# Patient Record
Sex: Female | Born: 1935 | Race: White | Hispanic: No | Smoking: Current every day smoker
Health system: Southern US, Community
[De-identification: ages and names within clinical notes are randomized; demographics above are authoritative.]

## PROBLEM LIST (undated history)

## (undated) DIAGNOSIS — I10 Essential (primary) hypertension: Secondary | ICD-10-CM

## (undated) DIAGNOSIS — I071 Rheumatic tricuspid insufficiency: Secondary | ICD-10-CM

## (undated) DIAGNOSIS — E7800 Pure hypercholesterolemia, unspecified: Secondary | ICD-10-CM

## (undated) DIAGNOSIS — J449 Chronic obstructive pulmonary disease, unspecified: Secondary | ICD-10-CM

## (undated) DIAGNOSIS — I4891 Unspecified atrial fibrillation: Secondary | ICD-10-CM

## (undated) DIAGNOSIS — I5032 Chronic diastolic (congestive) heart failure: Secondary | ICD-10-CM

## (undated) DIAGNOSIS — I639 Cerebral infarction, unspecified: Secondary | ICD-10-CM

## (undated) DIAGNOSIS — I2510 Atherosclerotic heart disease of native coronary artery without angina pectoris: Secondary | ICD-10-CM

## (undated) HISTORY — PX: CORONARY ANGIOPLASTY WITH STENT PLACEMENT: SHX49

## (undated) HISTORY — PX: HIP SURGERY: SHX245

---

## 2009-06-08 ENCOUNTER — Encounter: Admission: RE | Admit: 2009-06-08 | Discharge: 2009-06-08 | Payer: Self-pay | Admitting: Cardiovascular Disease

## 2009-06-14 ENCOUNTER — Inpatient Hospital Stay (HOSPITAL_COMMUNITY): Admission: RE | Admit: 2009-06-14 | Discharge: 2009-06-15 | Payer: Self-pay | Admitting: Cardiovascular Disease

## 2009-06-25 ENCOUNTER — Ambulatory Visit: Payer: Self-pay | Admitting: Surgery

## 2009-07-03 ENCOUNTER — Encounter: Payer: Self-pay | Admitting: Surgery

## 2009-07-03 ENCOUNTER — Inpatient Hospital Stay (HOSPITAL_COMMUNITY): Admission: RE | Admit: 2009-07-03 | Discharge: 2009-07-04 | Payer: Self-pay | Admitting: Surgery

## 2009-07-03 ENCOUNTER — Ambulatory Visit: Payer: Self-pay | Admitting: Surgery

## 2009-07-16 ENCOUNTER — Ambulatory Visit: Payer: Self-pay | Admitting: Surgery

## 2009-08-28 ENCOUNTER — Inpatient Hospital Stay (HOSPITAL_COMMUNITY): Admission: RE | Admit: 2009-08-28 | Discharge: 2009-08-29 | Payer: Self-pay | Admitting: Cardiovascular Disease

## 2010-02-12 ENCOUNTER — Encounter: Admission: RE | Admit: 2010-02-12 | Discharge: 2010-02-12 | Payer: Self-pay | Admitting: Vascular Surgery

## 2010-12-02 LAB — BASIC METABOLIC PANEL
BUN: 18 mg/dL (ref 6–23)
CO2: 26 mEq/L (ref 19–32)
Calcium: 8.9 mg/dL (ref 8.4–10.5)
Calcium: 9 mg/dL (ref 8.4–10.5)
Chloride: 105 mEq/L (ref 96–112)
Chloride: 111 mEq/L (ref 96–112)
Creatinine, Ser: 1.19 mg/dL (ref 0.4–1.2)
GFR calc Af Amer: 53 mL/min — ABNORMAL LOW (ref >60)
Glucose, Bld: 94 mg/dL (ref 70–99)
Sodium: 136 mEq/L (ref 135–145)
Sodium: 143 mEq/L (ref 135–145)

## 2010-12-02 LAB — CBC
Hemoglobin: 11.2 g/dL — ABNORMAL LOW (ref 12.0–15.0)
MCHC: 34.3 g/dL (ref 30.0–36.0)
MCV: 96.2 fL (ref 78.0–100.0)
Platelets: 138 10*3/uL — ABNORMAL LOW (ref 150–400)
RDW: 13 % (ref 11.5–15.5)
WBC: 6.6 10*3/uL (ref 4.0–10.5)

## 2010-12-04 LAB — BASIC METABOLIC PANEL
BUN: 18 mg/dL (ref 6–23)
CO2: 24 mEq/L (ref 19–32)
Chloride: 105 mEq/L (ref 96–112)
Glucose, Bld: 96 mg/dL (ref 70–99)
Potassium: 3.9 mEq/L (ref 3.5–5.1)
Sodium: 134 mEq/L — ABNORMAL LOW (ref 135–145)

## 2010-12-04 LAB — CBC: Hemoglobin: 9.9 g/dL — ABNORMAL LOW (ref 12.0–15.0)

## 2010-12-05 LAB — CBC
Hemoglobin: 11.6 g/dL — ABNORMAL LOW (ref 12.0–15.0)
MCV: 98.5 fL (ref 78.0–100.0)
Platelets: 189 10*3/uL (ref 150–400)
Platelets: 152 10*3/uL (ref 150–400)
RBC: 4.06 MIL/uL (ref 3.87–5.11)
RDW: 13.6 % (ref 11.5–15.5)

## 2010-12-05 LAB — COMPREHENSIVE METABOLIC PANEL
ALT: 15 U/L (ref 0–35)
Alkaline Phosphatase: 50 U/L (ref 39–117)
BUN: 24 mg/dL — ABNORMAL HIGH (ref 6–23)
Creatinine, Ser: 1.38 mg/dL — ABNORMAL HIGH (ref 0.4–1.2)
Total Bilirubin: 0.5 mg/dL (ref 0.3–1.2)
Total Protein: 7.1 g/dL (ref 6.0–8.3)

## 2010-12-05 LAB — BASIC METABOLIC PANEL
BUN: 15 mg/dL (ref 6–23)
BUN: 19 mg/dL (ref 6–23)
CO2: 24 mEq/L (ref 19–32)
CO2: 31 mEq/L (ref 19–32)
Calcium: 8.7 mg/dL (ref 8.4–10.5)
Chloride: 103 mEq/L (ref 96–112)
Creatinine, Ser: 1.6 mg/dL — ABNORMAL HIGH (ref 0.4–1.2)
GFR calc Af Amer: 38 mL/min — ABNORMAL LOW (ref >60)
GFR calc Af Amer: 51 mL/min — ABNORMAL LOW (ref >60)
Glucose, Bld: 81 mg/dL (ref 70–99)
Potassium: 3.4 mEq/L — ABNORMAL LOW (ref 3.5–5.1)
Potassium: 4.1 mEq/L (ref 3.5–5.1)
Sodium: 134 mEq/L — ABNORMAL LOW (ref 135–145)
Sodium: 142 mEq/L (ref 135–145)

## 2010-12-05 LAB — PROTIME-INR
INR: 1 (ref 0.00–1.49)
Prothrombin Time: 13.1 seconds (ref 11.6–15.2)

## 2010-12-05 LAB — URINALYSIS, ROUTINE W REFLEX MICROSCOPIC
Urobilinogen, UA: 0.2 mg/dL (ref 0.0–1.0)
pH: 8 (ref 5.0–8.0)

## 2010-12-05 LAB — TYPE AND SCREEN: ABO/RH(D): A POS

## 2010-12-05 LAB — APTT: aPTT: 31 seconds (ref 24–37)

## 2010-12-05 LAB — URINE MICROSCOPIC-ADD ON

## 2011-01-14 NOTE — Assessment & Plan Note (Signed)
OFFICE VISIT   Sherry, Sherry [name]  DOB:  12/13/1928                                       06/25/2009  CHART#:[id number]   REASON FOR VISIT:  Right carotid stenosis.   HISTORY:  This is a 77 year old female seen at the request of Dr. Allyson [name]  for evaluation of a high-grade right carotid stenosis which is  asymptomatic.  The patient has no complaints at this time.  She denies  numbness or weakness in either extremity.  She denies slurring of her  speech.  She denies amaurosis fugax.   The patient has a history of hypertension and hypercholesterolemia, both  of which are managed medically.  She has undergone cardiac  catheterization in [place] and has a questionable stent.  She recently  underwent cardiac catheterization by Dr. Allyson [name], who found stable  coronary artery disease with 100% right coronary stenosis with left-to-  right collaterals.  The patient also has peripheral vascular disease and  left leg claudication with a highly-stenotic left external iliac artery.  She continues to be a smoker and is trying to quit.  She is now down to  4-6 cigarettes per day.   REVIEW OF SYSTEMS:  CARDIAC:  Positive for chest pain in chest pressure.  GI:  Positive for constipation.  GU:  Positive for frequent urination.  NEUROLOGIC:  Positive for dizziness.  MUSCULOSKELETAL:  Positive for arthritis.  PSYCH:  Positive for depression.  ENT:  Positive for change in eyesight.  All other review of systems is negative as detailed in the encounter  form.   PAST MEDICAL HISTORY:  Hypertension, hypercholesterolemia, coronary  artery disease, peripheral vascular disease, tobacco abuse.   FAMILY HISTORY:  History of cerebrovascular disease and stroke run in  her family.   SOCIAL HISTORY:  She is single with 6 children.  She is retired.  She  currently smokes 4-6 cigarettes a day.  She does not drink alcohol.   MEDICATIONS:  Please see medical record.   ALLERGIES:  None.   PHYSICAL EXAMINATION:  Heart rate 60, blood pressure is 171/80, O2  saturations are 98%.  General:  She is well-appearing, in no distress.  HEENT:  Normocephalic, atraumatic.  Pupils equal.  Sclerae are  anicteric.  Extraocular muscles are intact.  Neck is supple.  No JVD.  No carotid bruits.  Cardiovascular is regular rate rhythm, no murmurs.  Lungs are clear bilaterally.  Abdomen is soft.  Musculoskeletal:  No  major deformities or cyanosis.  Neurologic:  There is no focal weakness  or paresthesias.  Skin is without rash.  Psych:  She is alert and  oriented x3.   DIAGNOSTIC STUDIES:  I have independently reviewed the carotid angiogram  performed by Dr. Allyson [name].  This reveals a high-grade right carotid  stenosis approximately 95% at the bifurcation.  It is 2 cm below the  angle of the mandible.   ASSESSMENT/PLAN:  Asymptomatic high-grade right carotid stenosis.   PLAN:  The patient will be scheduled for a right carotid endarterectomy.  I have discussed the risks and benefits of proceeding with surgery.  This includes the risk of stroke, the risk of nerve injury, the risk of  bleeding and cardiopulmonary complications.  All of the patient's  questions were answered.  She was here today with her daughter.  Her  surgery has been scheduled for Tuesday,  November 2.   Jorge Ny, MD  Electronically Signed   VWB/MEDQ  D:  06/25/2009  T:  06/26/2009  Job:  [id number]   cc:   Sherry [name], M.D.

## 2011-01-14 NOTE — Assessment & Plan Note (Signed)
OFFICE VISIT   LYSHA, SCHRADE L  DOB:  [birth year]                                       07/16/2009  CHART#:[id number]   REASON FOR VISIT:  Postoperative.   HISTORY:  This is a 77 year old female who is status post right carotid  endarterectomy on July 03, 2009.  This was done for asymptomatic  disease.  Operative findings included 75% stenosis without thrombus.  The patient's postoperative course was uncomplicated.  She discharged  home the following day.  She comes back in today for followup.  She is  having no complaints at this time.  She is neurologically intact.  Her  incision is well healed.   The patient will continue to receive her carotid followup at Dr. Hazle Coca  office.  She will contact me on p.r.n. basis.   Jorge Ny, MD  Electronically Signed   VWB/MEDQ  D:  07/16/2009  T:  07/17/2009  Job:  [id number]   cc:   Nanetta Batty, M.D.

## 2011-01-14 NOTE — Procedures (Signed)
[name], [name] NO.:  [id number]   MEDICAL RECORD NO.:  [id number]          PATIENT TYPE:  INP   LOCATION:  2807                         FACILITY:  [facility]   PHYSICIAN:  Nanetta Batty, M.D.   DATE OF BIRTH:  1929/06/10   DATE OF PROCEDURE:  DATE OF DISCHARGE:                    PERIPHERAL VASCULAR INVASIVE PROCEDURE   [name] is a 77 year old, widowed, white female, mother of 7,  grandmother of 1, who recently relocated from [place] approximately 1  year ago.  She was sent to me for cardiovascular evaluation with  Dopplers that were performed back in February that showed high-grade  right ICA stenosis.  She also has left hip claudication and ABI of 0.7  on that side.  She presents now after having undergone diagnostic  coronary arteriography revealing 1-vessel CAD for cerebral angiography  and distal abdominal aortography.   PROCEDURE DESCRIPTION:  Using the 5-French sheath which was previously  inserted in the right femoral artery, arch angiography, distal abdominal  aortography, selective right and left carotid and left vertebral  angiography, as well as selective left iliac angiography were performed.  Visipaque dye was used for the entirety of the case.  Retrograde aortic  pressure was monitored during the case.   ANGIOGRAPHIC RESULTS:  Arch aortogram:  Type 2 arch.  Right carotid:  A 95% right carotid bulb/proximal ICA with complex  disease.  The right carotid did not fill the anterior cerebrals.  Left carotid:  No significant disease in the ICAs.  The left carotid did  fill both anterior cerebrals.  There appeared to be 60% stenosis in the  intracranial carotid in what appears to be the cavernous portion.  Neuro  Interventional Radiology will interpret this.  Left vertebral:  Large vessel __________significant disease.   ABDOMINAL AORTOGRAPHY:  Renal arteries:  Normal.  Infrarenal abdominal aorta:  Moderate atherosclerotic changes.  Left lower  extremity:  A 95% left external iliac artery stenosis.   IMPRESSION:  Sherry [name] has high-grade right internal carotid artery  stenosis which will require endarterectomy for prophylaxis against  stroke.  She will also need staged left external iliac artery PT and  stent for functional lumen and claudication.   Sheath was removed and pressure was applied to achieve hemostasis.  Patient left the lab in stable condition.  She will be hydrated  overnight, discharge home in the morning, and will see me back in 1 week  for followup.      Nanetta Batty, M.D.  Electronically Signed     JB/MEDQ  D:  06/14/2009  T:  06/14/2009  Job:  [id number]   cc:   [facility]  Fleet Contras, M.D.  [facility] Angiographic Suite Second Floor, [place]

## 2011-01-14 NOTE — Procedures (Signed)
NAMEJAYLIAH, BENETT NO.:  [id number]   MEDICAL RECORD NO.:  [id number]          PATIENT TYPE:  INP   LOCATION:  2502                         FACILITY:  [facility]   PHYSICIAN:  Nanetta Batty, M.D.   DATE OF BIRTH:  May 17, 1929   DATE OF PROCEDURE:  08/28/2009  DATE OF DISCHARGE:                    PERIPHERAL VASCULAR INVASIVE PROCEDURE   Ms. Rocque is a 77 year old thin-appearing widowed white female mother of  24, grandmother to 78 grandchildren who has a history of CAD status post  cath revealing total RCA with left-right collaterals and normal LV  function.  She has hypertension, hyperlipidemia, tobacco abuse as well  as family history.   Angiogram to her revealing a 95% right internal carotid artery stenosis  and high grade left external iliac artery stenosis.  She underwent a  left-to-right carotid endarterectomy by Dr. Durene Cal on November  2nd and was discharged home the following day.  She recuperated nicely.  I saw her back in the office on December 7th and arranged for her to be  admitted today for TPA and stenting of her left external iliac artery  for functionally limiting claudication.   PROCEDURE DESCRIPTION:  The patient was brought to the 2nd floor [facility] Angiographic suite in the post absorptive state.  She was  premedicated with p.o. valium, some IV Versed and fentanyl.  Her right  groin was prepped and shaved in the usual sterile fashion.  Xylocaine 1%  was used for local anesthesia.  A 6-French crossover sheath was inserted  into the right femoral artery using standard Seldinger technique.  A 5-  [place] crossover catheter, a 3.5 Wholey wire was used to obtain  contralateral access.  The patient received __________ units of heparin  intravenously.  Visapaque dye was used for the entirety of the case.   The [facility] wire crossed the lesion and the lesion was predilated with a  4 x 2 power flex.  Stenting was performed using 8 x 4  Absolute  __________ self-expanding stent and pressed dilatation with a 7 x 2  power flex at 2 atmospheres resulting in reduction of 90% filling focal,  left external iliac carotid stenosis to zero percent residual.  The  patient tolerated the procedure well.  The sheath was withdrawn across  the bifurcation and exchanged for a short 6-French sheath.  The patient  left the lab in stable condition.  The sheath will be removed once  __________ 200.  The patient will be gently hydrated, and will be  discharged home in the morning.  Get followup Dopplers and ABIs after  which she will see me back in the office in followup.   She left the lab in stable condition.      Nanetta Batty, M.D.  Electronically Signed     JB/MEDQ  D:  08/28/2009  T:  08/28/2009  Job:  [id number]   cc:   2nd Floor PV Angiographic Suite  [facility]  Fleet Contras, M.D.

## 2017-09-03 DIAGNOSIS — M545 Low back pain: Secondary | ICD-10-CM | POA: Diagnosis not present

## 2017-09-03 DIAGNOSIS — I10 Essential (primary) hypertension: Secondary | ICD-10-CM | POA: Diagnosis not present

## 2017-09-03 DIAGNOSIS — I2510 Atherosclerotic heart disease of native coronary artery without angina pectoris: Secondary | ICD-10-CM | POA: Diagnosis not present

## 2017-09-03 DIAGNOSIS — Z720 Tobacco use: Secondary | ICD-10-CM | POA: Diagnosis not present

## 2017-09-03 DIAGNOSIS — Z79899 Other long term (current) drug therapy: Secondary | ICD-10-CM | POA: Diagnosis not present

## 2017-09-03 DIAGNOSIS — I739 Peripheral vascular disease, unspecified: Secondary | ICD-10-CM | POA: Diagnosis not present

## 2017-09-03 DIAGNOSIS — E7800 Pure hypercholesterolemia, unspecified: Secondary | ICD-10-CM | POA: Diagnosis not present

## 2017-09-08 ENCOUNTER — Ambulatory Visit
Admission: RE | Admit: 2017-09-08 | Discharge: 2017-09-08 | Disposition: A | Discharge status: Home / Self Care | Payer: Medicare HMO | Source: Ambulatory Visit | Attending: Family Medicine | Admitting: Family Medicine

## 2017-09-08 ENCOUNTER — Other Ambulatory Visit: Payer: Self-pay | Admitting: Family Medicine

## 2017-09-08 DIAGNOSIS — M545 Low back pain: Secondary | ICD-10-CM

## 2017-09-08 DIAGNOSIS — M47817 Spondylosis without myelopathy or radiculopathy, lumbosacral region: Secondary | ICD-10-CM | POA: Diagnosis not present

## 2017-10-11 ENCOUNTER — Inpatient Hospital Stay (HOSPITAL_COMMUNITY)
Admission: EM | Admit: 2017-10-11 | Discharge: 2017-10-13 | DRG: 690 | Disposition: A | Discharge status: Home Health | Payer: Medicare HMO | Attending: Internal Medicine | Admitting: Internal Medicine

## 2017-10-11 ENCOUNTER — Encounter (HOSPITAL_COMMUNITY): Payer: Self-pay

## 2017-10-11 ENCOUNTER — Other Ambulatory Visit: Payer: Self-pay

## 2017-10-11 ENCOUNTER — Emergency Department (HOSPITAL_COMMUNITY): Payer: Medicare HMO

## 2017-10-11 DIAGNOSIS — R32 Unspecified urinary incontinence: Secondary | ICD-10-CM | POA: Diagnosis present

## 2017-10-11 DIAGNOSIS — E876 Hypokalemia: Secondary | ICD-10-CM | POA: Diagnosis present

## 2017-10-11 DIAGNOSIS — I248 Other forms of acute ischemic heart disease: Secondary | ICD-10-CM | POA: Diagnosis not present

## 2017-10-11 DIAGNOSIS — Z7902 Long term (current) use of antithrombotics/antiplatelets: Secondary | ICD-10-CM | POA: Diagnosis not present

## 2017-10-11 DIAGNOSIS — R269 Unspecified abnormalities of gait and mobility: Secondary | ICD-10-CM | POA: Diagnosis not present

## 2017-10-11 DIAGNOSIS — I700 Atherosclerosis of aorta: Secondary | ICD-10-CM | POA: Diagnosis not present

## 2017-10-11 DIAGNOSIS — I2510 Atherosclerotic heart disease of native coronary artery without angina pectoris: Secondary | ICD-10-CM | POA: Diagnosis present

## 2017-10-11 DIAGNOSIS — E785 Hyperlipidemia, unspecified: Secondary | ICD-10-CM | POA: Diagnosis present

## 2017-10-11 DIAGNOSIS — B9729 Other coronavirus as the cause of diseases classified elsewhere: Secondary | ICD-10-CM | POA: Diagnosis present

## 2017-10-11 DIAGNOSIS — I272 Pulmonary hypertension, unspecified: Secondary | ICD-10-CM | POA: Diagnosis present

## 2017-10-11 DIAGNOSIS — R300 Dysuria: Secondary | ICD-10-CM

## 2017-10-11 DIAGNOSIS — I351 Nonrheumatic aortic (valve) insufficiency: Secondary | ICD-10-CM | POA: Diagnosis not present

## 2017-10-11 DIAGNOSIS — Z955 Presence of coronary angioplasty implant and graft: Secondary | ICD-10-CM | POA: Diagnosis not present

## 2017-10-11 DIAGNOSIS — R509 Fever, unspecified: Secondary | ICD-10-CM | POA: Diagnosis not present

## 2017-10-11 DIAGNOSIS — Z8249 Family history of ischemic heart disease and other diseases of the circulatory system: Secondary | ICD-10-CM | POA: Diagnosis not present

## 2017-10-11 DIAGNOSIS — I10 Essential (primary) hypertension: Secondary | ICD-10-CM | POA: Diagnosis present

## 2017-10-11 DIAGNOSIS — Z7982 Long term (current) use of aspirin: Secondary | ICD-10-CM

## 2017-10-11 DIAGNOSIS — Z79899 Other long term (current) drug therapy: Secondary | ICD-10-CM

## 2017-10-11 DIAGNOSIS — F17210 Nicotine dependence, cigarettes, uncomplicated: Secondary | ICD-10-CM | POA: Diagnosis present

## 2017-10-11 DIAGNOSIS — J069 Acute upper respiratory infection, unspecified: Secondary | ICD-10-CM | POA: Diagnosis not present

## 2017-10-11 DIAGNOSIS — G8929 Other chronic pain: Secondary | ICD-10-CM | POA: Diagnosis present

## 2017-10-11 DIAGNOSIS — R0602 Shortness of breath: Secondary | ICD-10-CM | POA: Diagnosis not present

## 2017-10-11 DIAGNOSIS — R05 Cough: Secondary | ICD-10-CM | POA: Diagnosis not present

## 2017-10-11 DIAGNOSIS — B9620 Unspecified Escherichia coli [E. coli] as the cause of diseases classified elsewhere: Secondary | ICD-10-CM | POA: Diagnosis present

## 2017-10-11 DIAGNOSIS — I4891 Unspecified atrial fibrillation: Secondary | ICD-10-CM | POA: Diagnosis not present

## 2017-10-11 DIAGNOSIS — J449 Chronic obstructive pulmonary disease, unspecified: Secondary | ICD-10-CM | POA: Diagnosis not present

## 2017-10-11 DIAGNOSIS — R000 Tachycardia, unspecified: Secondary | ICD-10-CM | POA: Diagnosis not present

## 2017-10-11 DIAGNOSIS — N390 Urinary tract infection, site not specified: Principal | ICD-10-CM | POA: Diagnosis present

## 2017-10-11 HISTORY — DX: Chronic obstructive pulmonary disease, unspecified: J44.9

## 2017-10-11 HISTORY — DX: Essential (primary) hypertension: I10

## 2017-10-11 HISTORY — DX: Pure hypercholesterolemia, unspecified: E78.00

## 2017-10-11 LAB — URINALYSIS, ROUTINE W REFLEX MICROSCOPIC
Bilirubin Urine: NEGATIVE
Glucose, UA: NEGATIVE mg/dL
KETONES UR: NEGATIVE mg/dL
Leukocytes, UA: NEGATIVE
Nitrite: POSITIVE — AB
PROTEIN: 30 mg/dL — AB
Specific Gravity, Urine: 1.008 (ref 1.005–1.030)
Squamous Epithelial / LPF: NONE SEEN
pH: 9 — ABNORMAL HIGH (ref 5.0–8.0)

## 2017-10-11 LAB — COMPREHENSIVE METABOLIC PANEL
ALT: 14 U/L (ref 14–54)
ANION GAP: 13 (ref 5–15)
AST: 21 U/L (ref 15–41)
Albumin: 3.7 g/dL (ref 3.5–5.0)
Alkaline Phosphatase: 59 U/L (ref 38–126)
BUN: 12 mg/dL (ref 6–20)
CHLORIDE: 101 mmol/L (ref 101–111)
CO2: 23 mmol/L (ref 22–32)
Calcium: 9.2 mg/dL (ref 8.9–10.3)
Creatinine, Ser: 0.84 mg/dL (ref 0.44–1.00)
Glucose, Bld: 131 mg/dL — ABNORMAL HIGH (ref 65–99)
POTASSIUM: 3.4 mmol/L — AB (ref 3.5–5.1)
SODIUM: 137 mmol/L (ref 135–145)
Total Bilirubin: 0.7 mg/dL (ref 0.3–1.2)
Total Protein: 7.2 g/dL (ref 6.5–8.1)

## 2017-10-11 LAB — CBC WITH DIFFERENTIAL/PLATELET
Basophils Absolute: 0 10*3/uL (ref 0.0–0.1)
Basophils Relative: 0 %
Eosinophils Absolute: 0.1 10*3/uL (ref 0.0–0.7)
Eosinophils Relative: 1 %
HEMATOCRIT: 40.4 % (ref 36.0–46.0)
HEMOGLOBIN: 13.1 g/dL (ref 12.0–15.0)
LYMPHS ABS: 0.8 10*3/uL (ref 0.7–4.0)
Lymphocytes Relative: 9 %
MCH: 32.1 pg (ref 26.0–34.0)
MCHC: 32.4 g/dL (ref 30.0–36.0)
MCV: 99 fL (ref 78.0–100.0)
MONOS PCT: 4 %
Monocytes Absolute: 0.4 10*3/uL (ref 0.1–1.0)
NEUTROS ABS: 7.4 10*3/uL (ref 1.7–7.7)
NEUTROS PCT: 86 %
Platelets: 186 10*3/uL (ref 150–400)
RBC: 4.08 MIL/uL (ref 3.87–5.11)
RDW: 15.3 % (ref 11.5–15.5)
WBC: 8.6 10*3/uL (ref 4.0–10.5)

## 2017-10-11 LAB — INFLUENZA PANEL BY PCR (TYPE A & B)
INFLBPCR: NEGATIVE
Influenza A By PCR: NEGATIVE

## 2017-10-11 LAB — I-STAT CG4 LACTIC ACID, ED
LACTIC ACID, VENOUS: 1.95 mmol/L — AB (ref 0.5–1.9)
LACTIC ACID, VENOUS: 0.32 mmol/L — AB (ref 0.5–1.9)

## 2017-10-11 MED ORDER — VITAMIN D 1000 UNITS PO TABS
1000.0000 [IU] | ORAL_TABLET | Freq: Every day | ORAL | Status: DC
  Administered 2017-10-12 – 2017-10-13 (×2): 1000 [IU] via ORAL
  Filled 2017-10-11 (×2): qty 1

## 2017-10-11 MED ORDER — OMEGA-3-ACID ETHYL ESTERS 1 G PO CAPS
1.0000 g | ORAL_CAPSULE | Freq: Every day | ORAL | Status: DC
  Administered 2017-10-12 – 2017-10-13 (×2): 1 g via ORAL
  Filled 2017-10-11 (×2): qty 1

## 2017-10-11 MED ORDER — SODIUM CHLORIDE 0.9 % IV BOLUS (SEPSIS)
1000.0000 mL | Freq: Once | INTRAVENOUS | Status: AC
  Administered 2017-10-11: 1000 mL via INTRAVENOUS

## 2017-10-11 MED ORDER — ACETAMINOPHEN 325 MG PO TABS
650.0000 mg | ORAL_TABLET | Freq: Four times a day (QID) | ORAL | Status: DC | PRN
  Administered 2017-10-12 – 2017-10-13 (×4): 650 mg via ORAL
  Filled 2017-10-11 (×4): qty 2

## 2017-10-11 MED ORDER — ENOXAPARIN SODIUM 60 MG/0.6ML ~~LOC~~ SOLN
1.0000 mg/kg | Freq: Once | SUBCUTANEOUS | Status: AC
  Administered 2017-10-12: 55 mg via SUBCUTANEOUS
  Filled 2017-10-11 (×2): qty 0.6

## 2017-10-11 MED ORDER — ACETAMINOPHEN 500 MG PO TABS
1000.0000 mg | ORAL_TABLET | Freq: Once | ORAL | Status: AC
Start: 2017-10-11 — End: 2017-10-11
  Administered 2017-10-11: 1000 mg via ORAL
  Filled 2017-10-11: qty 2

## 2017-10-11 MED ORDER — DEXTROSE 5 % IV SOLN
1.0000 g | Freq: Once | INTRAVENOUS | Status: AC
  Administered 2017-10-11: 1 g via INTRAVENOUS
  Filled 2017-10-11: qty 10

## 2017-10-11 MED ORDER — DOCUSATE SODIUM 100 MG PO CAPS
100.0000 mg | ORAL_CAPSULE | Freq: Every day | ORAL | Status: DC
  Administered 2017-10-12 – 2017-10-13 (×2): 100 mg via ORAL
  Filled 2017-10-11 (×2): qty 1

## 2017-10-11 MED ORDER — CLOPIDOGREL BISULFATE 75 MG PO TABS
75.0000 mg | ORAL_TABLET | Freq: Every day | ORAL | Status: DC
  Administered 2017-10-12 – 2017-10-13 (×2): 75 mg via ORAL
  Filled 2017-10-11 (×2): qty 1

## 2017-10-11 MED ORDER — IRBESARTAN 75 MG PO TABS
75.0000 mg | ORAL_TABLET | Freq: Every day | ORAL | Status: DC
  Administered 2017-10-12 – 2017-10-13 (×2): 75 mg via ORAL
  Filled 2017-10-11 (×2): qty 1

## 2017-10-11 MED ORDER — ATORVASTATIN CALCIUM 40 MG PO TABS
40.0000 mg | ORAL_TABLET | Freq: Every day | ORAL | Status: DC
  Administered 2017-10-12 (×2): 40 mg via ORAL
  Filled 2017-10-11 (×2): qty 1

## 2017-10-11 MED ORDER — ASPIRIN 81 MG PO CHEW
81.0000 mg | CHEWABLE_TABLET | Freq: Every day | ORAL | Status: DC
  Administered 2017-10-12 – 2017-10-13 (×2): 81 mg via ORAL
  Filled 2017-10-11 (×2): qty 1

## 2017-10-11 MED ORDER — DILTIAZEM HCL-DEXTROSE 100-5 MG/100ML-% IV SOLN (PREMIX)
5.0000 mg/h | INTRAVENOUS | Status: DC
  Administered 2017-10-11: 5 mg/h via INTRAVENOUS
  Filled 2017-10-11: qty 100

## 2017-10-11 MED ORDER — ACETAMINOPHEN 650 MG RE SUPP: 650.0000 mg | Freq: Four times a day (QID) | RECTAL | Status: DC | PRN

## 2017-10-11 MED ORDER — DILTIAZEM LOAD VIA INFUSION
10.0000 mg | Freq: Once | INTRAVENOUS | Status: AC
  Administered 2017-10-11: 10 mg via INTRAVENOUS
  Filled 2017-10-11: qty 10

## 2017-10-11 MED ORDER — SODIUM CHLORIDE 0.9 % IV SOLN
INTRAVENOUS | Status: DC
  Administered 2017-10-12: via INTRAVENOUS

## 2017-10-11 MED ORDER — ENOXAPARIN SODIUM 40 MG/0.4ML ~~LOC~~ SOLN: 40.0000 mg | SUBCUTANEOUS | Status: DC

## 2017-10-11 NOTE — ED Notes (Signed)
Patient transported to X-ray 

## 2017-10-11 NOTE — H&P (Signed)
TRH H&P   Patient Demographics:    Sherry [name], is a 84 y.o. female  MRN: [id number]   DOB - Nov 05, 1928  Admit Date - 10/11/2017  Outpatient Primary MD for the patient is Darrow Bussing, MD  Referring MD/NP/PA: Iantha Fallen PA  Outpatient Specialists:   Patient coming from: home  Chief Complaint  Patient presents with  . Cough  . Fatigue      HPI:    Sherry [name]  is a 84 y.o. female, w Copd (not on home o2), , Tobacco use, Hypertension, Hyperlipidemia,  CAD s/p stent apparently presents with c/o tachycardia and bp elevation, and urinary incontinence.   In Ed,  EKG Afib at 130, nl axis  CXR IMPRESSION: 1. No convincing pneumonia. 2. Prominent bronchovascular markings, but no convincing pulmonary edema. Hyperexpanded lungs. Findings suggest COPD.   Na 137, K 3.4, Bun 12, Creatinine 0.84 Ast 21, Alt 14  Wbc 8.6, Hgb 13.1, Plt 186  Urine prot 30, nitrite positive, LE negative Wbc 0-5  LA 1.95  Pt will be admitted for fever unclear source, afib with RVR,     Review of systems:    In addition to the HPI above, No Fever-chills, No Headache, No changes with Vision or hearing, No problems swallowing food or Liquids, No Chest pain, Cough or Shortness of Breath, No Abdominal pain, No Nausea or Vommitting, Bowel movements are regular, No Blood in stool or Urine,  No new skin rashes or bruises, No new joints pains-aches,  No new weakness, tingling, numbness in any extremity, No recent weight gain or loss, No polyuria, polydypsia or polyphagia, No significant Mental Stressors.  A full 10 point Review of Systems was done, except as stated above, all other Review of Systems were negative.   With Past History of the following :    Past Medical History:  Diagnosis Date  . COPD (chronic obstructive pulmonary disease) (HCC)   . Hypercholesteremia   . Hypertension        Past Surgical History:  Procedure Laterality Date  . CORONARY ANGIOPLASTY WITH STENT PLACEMENT    . HIP SURGERY        Social History:     Social History   Tobacco Use  . Smoking status: Current Every Day Smoker    Packs/day: 1.00    Types: Cigarettes  . Smokeless tobacco: Never Used  Substance Use Topics  . Alcohol use: No    Frequency: Never     Lives - at home  Mobility - walks by self ?     Family History :     Family History  Problem Relation Age of Onset  . Heart attack Mother       Home Medications:   Prior to Admission medications   Medication Sig Start Date End Date Taking? Authorizing Provider  acetaminophen (TYLENOL) 500 MG tablet Take 1,300 mg by mouth daily  as needed.   Yes [provider]  aspirin 81 MG chewable tablet Chew 81 mg by mouth daily. 12/05/13  Yes [provider]  atorvastatin (LIPITOR) 40 MG tablet Take 40 mg by mouth at bedtime. 12/22/16  Yes [provider]  carvedilol (COREG) 12.5 MG tablet Take 12.5 mg by mouth 2 (two) times daily. 12/03/16  Yes [provider]  cholecalciferol (VITAMIN D) 1000 units tablet Take 1,000 Units by mouth daily.   Yes [provider]  clopidogrel (PLAVIX) 75 MG tablet Take 75 mg by mouth daily. 10/05/17  Yes [provider]  docusate sodium (COLACE) 100 MG capsule Take 100 mg by mouth daily.   Yes [provider]  omega-3 acid ethyl esters (LOVAZA) 1 g capsule Take 1 g by mouth daily.   Yes [provider]  telmisartan (MICARDIS) 20 MG tablet Take 20 mg by mouth daily. 09/28/17  Yes [provider]     Allergies:    No Known Allergies   Physical Exam:   Vitals  Blood pressure 122/81, pulse 71, temperature (!) 102.2 F (39 C), temperature source Oral, resp. rate (!) 0, height 5\' 2"  (1.575 m), weight 54 kg (119 lb), SpO2 96 %.   1. General  lying in bed in NAD,    2. Normal affect and insight, Not Suicidal or  Homicidal, Awake Alert, Oriented X 3.  3. No F.N deficits, ALL C.Nerves Intact, Strength 5/5 all 4 extremities, Sensation intact all 4 extremities, Plantars down going.  4. Ears and Eyes appear Normal, Conjunctivae clear, PERRLA. Moist Oral Mucosa.  5. Supple Neck, No JVD, No cervical lymphadenopathy appriciated, No Carotid Bruits.  6. Symmetrical Chest wall movement, Good air movement bilaterally, slight crackles left lung base, no wheezing  7. Irr, irr s1, s2,   8. Positive Bowel Sounds, Abdomen Soft, No tenderness, No organomegaly appriciated,No rebound -guarding or rigidity.  9.  No Cyanosis, Normal Skin Turgor, No Skin Rash or Bruise.  10. Good muscle tone,  joints appear normal , no effusions, Normal ROM.  11. No Palpable Lymph Nodes in Neck or Axillae     Data Review:    CBC Recent Labs  Lab 10/11/17 1847  WBC 8.6  HGB 13.1  HCT 40.4  PLT 186  MCV 99.0  MCH 32.1  MCHC 32.4  RDW 15.3  LYMPHSABS 0.8  MONOABS 0.4  EOSABS 0.1  BASOSABS 0.0   ------------------------------------------------------------------------------------------------------------------  Chemistries  Recent Labs  Lab 10/11/17 1847  NA 137  K 3.4*  CL 101  CO2 23  GLUCOSE 131*  BUN 12  CREATININE 0.84  CALCIUM 9.2  AST 21  ALT 14  ALKPHOS 59  BILITOT 0.7   ------------------------------------------------------------------------------------------------------------------ estimated creatinine clearance is 38 mL/min (by C-G formula based on SCr of 0.84 mg/dL). ------------------------------------------------------------------------------------------------------------------ No results for input(s): TSH, T4TOTAL, T3FREE, THYROIDAB in the last 72 hours.  Invalid input(s): FREET3  Coagulation profile No results for input(s): INR, PROTIME in the last 168 hours. ------------------------------------------------------------------------------------------------------------------- No results  for input(s): DDIMER in the last 72 hours. -------------------------------------------------------------------------------------------------------------------  Cardiac Enzymes No results for input(s): CKMB, TROPONINI, MYOGLOBIN in the last 168 hours.  Invalid input(s): CK ------------------------------------------------------------------------------------------------------------------ No results found for: BNP   ---------------------------------------------------------------------------------------------------------------  Urinalysis    Component Value Date/Time   COLORURINE YELLOW 10/11/2017 1847   APPEARANCEUR CLEAR 10/11/2017 1847   LABSPEC 1.008 10/11/2017 1847   PHURINE 9.0 (H) 10/11/2017 1847   GLUCOSEU NEGATIVE 10/11/2017 1847   HGBUR SMALL (A) 10/11/2017 1847  BILIRUBINUR NEGATIVE 10/11/2017 1847   KETONESUR NEGATIVE 10/11/2017 1847   PROTEINUR 30 (A) 10/11/2017 1847   UROBILINOGEN 0.2 06/27/2009 1600   NITRITE POSITIVE (A) 10/11/2017 1847   LEUKOCYTESUR NEGATIVE 10/11/2017 1847    ----------------------------------------------------------------------------------------------------------------   Imaging Results:    Dg Chest 2 View  Result Date: 10/11/2017 CLINICAL DATA:  Patient reports flu like symptoms like cough, pain in chest with coughing X 1 week. Patients family reports they are looking for pneumonia. HX smoker EXAM: CHEST  2 VIEW COMPARISON:  06/08/2009 FINDINGS: Cardiac silhouette is mildly enlarged. No mediastinal or hilar masses. No convincing adenopathy. Lungs are hyperexpanded. There are prominent interstitial/bronchovascular markings, increased when compared to the prior CT. No lung consolidation. No pleural effusion or pneumothorax. Skeletal structures are demineralized but grossly intact. IMPRESSION: 1. No convincing pneumonia. 2. Prominent bronchovascular markings, but no convincing pulmonary edema. Hyperexpanded lungs. Findings suggest COPD.  Electronically Signed   By: Amie Portland M.D.   On: 10/11/2017 19:18       Assessment & Plan:    Principal Problem:   Fever Active Problems:   Hypokalemia   Tachycardia   Atrial fibrillation with RVR (HCC)    Afib with RVR,  Tele Trop I q6h x3 D dimer, if positive then CTA chest r/o PE Tsh Cardiac echo cardizem GTT  Hypokalemia Replete Check cmp in am  Fever Unclear source Blood culture x2  Urine culture  Received rocephin 1gm iv x1 in the ED  CAD Cont aspirin, plavix, carvedilol telmisartan, lipitor  DVT Prophylaxis  Lovenox - SCDs  AM Labs Ordered, also please review Full Orders  Family Communication: Admission, patients condition and plan of care including tests being ordered have been discussed with the patient  who indicate understanding and agree with the plan and Code Status.  Code Status FULL CODE  Likely DC to  home  Condition GUARDED    Consults called:none  Admission status: inpatient    Time spent in minutes : 45   Pearson Grippe M.D on 10/11/2017 at 10:40 PM  Between 7am to 7pm - Pager - [phone]. After 7pm go to [email or web address] - password [facility]  [facility] - Office  [phone]

## 2017-10-11 NOTE — ED Provider Notes (Signed)
[facility] EMERGENCY DEPARTMENT Provider Note   CSN: [id number] Arrival date & time: 10/11/17  1810     History   Chief Complaint Chief Complaint  Patient presents with  . Cough  . Fatigue    HPI Sherry [name] is a 84 y.o. female.  HPI 84 year old Caucasian female past medical history significant for hypertension, hypercholesteremia, COPD presents to the ED for evaluation of dysuria and suprapubic abdominal pain.  States his this has been ongoing for the past week and progressively worsened.  On assessment in triage patient was noted to be febrile.  Patient denies any history of fever.  Patient reports urgency and frequency as well.  She reports a cough which is baseline for patient given her history of COPD.  Denies any other influenza-like illness symptoms including rhinorrhea, otalgia, sore throat.  Denies any known sick contacts.  Patient reports chronic low back pain but denies any flank pain.  Did not receive influenza vaccination this year.  Patient denies any associated chest pain, shortness of breath, productive cough.  She has not taken any of her symptoms prior to arrival.  Nothing makes better or worse.  She does report nausea with one episode of emesis today.  Daughter at bedside states that they came to the ED because her blood pressure was elevated and her heart rate was elevated.  Pt denies any fever, chill, ha, vision changes, lightheadedness, dizziness, congestion, neck pain, cp, sob,  change in bowel habits, melena, hematochezia, lower extremity paresthesias.  Past Medical History:  Diagnosis Date  . COPD (chronic obstructive pulmonary disease) (HCC)   . Hypercholesteremia   . Hypertension     There are no active problems to display for this patient.   Past Surgical History:  Procedure Laterality Date  . CORONARY ANGIOPLASTY WITH STENT PLACEMENT    . HIP SURGERY      OB History    No data available       Home Medications    Prior  to Admission medications   Medication Sig Start Date End Date Taking? Authorizing Provider  acetaminophen (TYLENOL) 500 MG tablet Take 1,300 mg by mouth daily as needed.   Yes [provider]  aspirin 81 MG chewable tablet Chew 81 mg by mouth daily. 12/05/13  Yes [provider]  atorvastatin (LIPITOR) 40 MG tablet Take 40 mg by mouth at bedtime. 12/22/16  Yes [provider]  carvedilol (COREG) 12.5 MG tablet Take 12.5 mg by mouth 2 (two) times daily. 12/03/16  Yes [provider]  cholecalciferol (VITAMIN D) 1000 units tablet Take 1,000 Units by mouth daily.   Yes [provider]  clopidogrel (PLAVIX) 75 MG tablet Take 75 mg by mouth daily. 10/05/17  Yes [provider]  docusate sodium (COLACE) 100 MG capsule Take 100 mg by mouth daily.   Yes [provider]  omega-3 acid ethyl esters (LOVAZA) 1 g capsule Take 1 g by mouth daily.   Yes [provider]  telmisartan (MICARDIS) 20 MG tablet Take 20 mg by mouth daily. 09/28/17  Yes [provider]    Family History No family history on file.  Social History Social History   Tobacco Use  . Smoking status: Current Every Day Smoker    Packs/day: 1.00    Types: Cigarettes  . Smokeless tobacco: Never Used  Substance Use Topics  . Alcohol use: No    Frequency: Never  . Drug use: No     Allergies  Patient has no known allergies.   Review of Systems Review of Systems  Constitutional: Negative for chills, diaphoresis and fever.  HENT: Negative for congestion.   Eyes: Negative for visual disturbance.  Respiratory: Negative for cough and shortness of breath.   Cardiovascular: Negative for chest pain, palpitations and leg swelling.  Gastrointestinal: Positive for abdominal pain, nausea and vomiting. Negative for diarrhea.  Genitourinary: Positive for dysuria, frequency and urgency. Negative for flank pain and hematuria.  Musculoskeletal: Negative for arthralgias  and myalgias.  Skin: Negative for rash.  Neurological: Negative for dizziness, syncope, weakness, light-headedness, numbness and headaches.  Psychiatric/Behavioral: Negative for sleep disturbance. The patient is not nervous/anxious.      Physical Exam Updated Vital Signs BP (!) 142/94   Pulse (!) 127   Temp (!) 102.2 F (39 C) (Oral)   Resp (!) 24   Ht 5\' 2"  (1.575 m)   Wt 54 kg (119 lb)   SpO2 96%   BMI 21.77 kg/m   Physical Exam  Constitutional: She is oriented to person, place, and time. She appears well-developed and well-nourished.  Non-toxic appearance. No distress.  HENT:  Head: Normocephalic and atraumatic.  Nose: Nose normal.  Mouth/Throat: Oropharynx is clear and moist.  Eyes: Conjunctivae are normal. Pupils are equal, round, and reactive to light. Right eye exhibits no discharge. Left eye exhibits no discharge.  Neck: Normal range of motion. Neck supple.  No c spine midline tenderness. No paraspinal tenderness. No deformities or step offs noted. Full ROM. Supple. No nuchal rigidity.    Cardiovascular: Normal heart sounds and intact distal pulses. An irregularly irregular rhythm present. Tachycardia present. Exam reveals no gallop and no friction rub.  No murmur heard. Pulmonary/Chest: Effort normal and breath sounds normal. No stridor. No respiratory distress. She has no wheezes. She has no rales. She exhibits no tenderness.  Abdominal: Soft. Bowel sounds are normal. There is tenderness in the suprapubic area. There is no rigidity, no rebound, no guarding, no CVA tenderness, no tenderness at McBurney's point and negative Murphy's sign.  Musculoskeletal: Normal range of motion. She exhibits no tenderness.  No midline T spine or L spine tenderness. No deformities or step offs noted. Full ROM. Pelvis is stable.   Lymphadenopathy:    She has no cervical adenopathy.  Neurological: She is alert and oriented to person, place, and time.  Skin: Skin is warm and dry.  Capillary refill takes less than 2 seconds.  Psychiatric: Her behavior is normal. Judgment and thought content normal.  Nursing note and vitals reviewed.    ED Treatments / Results  Labs (all labs ordered are listed, but only abnormal results are displayed) Labs Reviewed  COMPREHENSIVE METABOLIC PANEL - Abnormal; Notable for the following components:      Result Value   Potassium 3.4 (*)    Glucose, Bld 131 (*)    All other components within normal limits  URINALYSIS, ROUTINE W REFLEX MICROSCOPIC - Abnormal; Notable for the following components:   pH 9.0 (*)    Hgb urine dipstick SMALL (*)    Protein, ur 30 (*)    Nitrite POSITIVE (*)    Bacteria, UA RARE (*)    All other components within normal limits  I-STAT CG4 LACTIC ACID, ED - Abnormal; Notable for the following components:   Lactic Acid, Venous 1.95 (*)    All other components within normal limits  URINE CULTURE  CULTURE, BLOOD (ROUTINE X 2)  CULTURE, BLOOD (ROUTINE X 2)  CBC WITH DIFFERENTIAL/PLATELET  INFLUENZA PANEL BY PCR (TYPE A & B)  I-STAT CG4 LACTIC ACID, ED    EKG  EKG Interpretation  Date/Time:  Sunday October 11 2017 20:52:44 EST Ventricular Rate:  128 PR Interval:    QRS Duration: 100 QT Interval:  347 QTC Calculation: 517 R Axis:   -54 Text Interpretation:  Atrial fibrillation Ventricular premature complex LAD, consider left anterior fascicular block LVH with secondary repolarization abnormality ST depression, probably rate related Prolonged QT interval No old tracing to compare Confirmed by Jerelyn Scott [phone]) on 10/11/2017 9:01:56 PM       Radiology Dg Chest 2 View  Result Date: 10/11/2017 CLINICAL DATA:  Patient reports flu like symptoms like cough, pain in chest with coughing X 1 week. Patients family reports they are looking for pneumonia. HX smoker EXAM: CHEST  2 VIEW COMPARISON:  06/08/2009 FINDINGS: Cardiac silhouette is mildly enlarged. No mediastinal or hilar masses. No convincing  adenopathy. Lungs are hyperexpanded. There are prominent interstitial/bronchovascular markings, increased when compared to the prior CT. No lung consolidation. No pleural effusion or pneumothorax. Skeletal structures are demineralized but grossly intact. IMPRESSION: 1. No convincing pneumonia. 2. Prominent bronchovascular markings, but no convincing pulmonary edema. Hyperexpanded lungs. Findings suggest COPD. Electronically Signed   By: Amie Portland M.D.   On: 10/11/2017 19:18    Procedures .Critical Care Performed by: Rise Mu, PA-C Authorized by: Rise Mu, PA-C   Critical care provider statement:    Critical care time (minutes):  60   Critical care was necessary to treat or prevent imminent or life-threatening deterioration of the following conditions: afib with rvr with diltiazem drip.   Critical care was time spent personally by me on the following activities:  Blood draw for specimens, discussions with consultants, discussions with primary provider, evaluation of patient's response to treatment, examination of patient, ordering and performing treatments and interventions, ordering and review of laboratory studies, ordering and review of radiographic studies, pulse oximetry, re-evaluation of patient's condition, review of old charts and obtaining history from patient or surrogate   (including critical care time)  Medications Ordered in ED Medications  cefTRIAXone (ROCEPHIN) 1 g in dextrose 5 % 50 mL IVPB (not administered)  acetaminophen (TYLENOL) tablet 1,000 mg (1,000 mg Oral Given 10/11/17 1928)  sodium chloride 0.9 % bolus 1,000 mL (1,000 mLs Intravenous New Bag/Given 10/11/17 1943)     Initial Impression / Assessment and Plan / ED Course  I have reviewed the triage vital signs and the nursing notes.  Pertinent labs & imaging results that were available during my care of the patient were reviewed by me and considered in my medical decision making (see chart for  details).     Patient presents to the ED for evaluation of suprapubic abdominal pain and dysuria.  She reports associated nausea and one episode of emesis.  She reports a baseline cough from COPD but denies any significant sputum production.  Patient denies any known fevers at home.  On exam in triage patient was noted to be febrile 102.2 and tachycardic.  Patient also is tachypneic.  No hypotension was noted.  Patient satting at 93% on room air which appears baseline for patient.  Denies any oxygen use at home.  Denies any associated chest pain or shortness of breath.  Patient given Tylenol.  On exam patient was noted to have an irregularly irregular rhythm and tachycardia.  This seems consistent with A. fib with RVR.  EKG correlates with A. fib with RVR.  Patient  denies any history of same.  She denies any chest pain or shortness of breath.  Patient does have some suprapubic abdominal pain to palpation.  No CVA tenderness.  Lungs clear to auscultation bilaterally.  Labs without any leukocytosis.  Hemoglobin at baseline and normal.  Electrolytes are reassuring.  Kidney function is normal.  Lactic acid 1.95.  UA consistent with urinary tract infection.  Urine culture is pending.  I also obtain blood cultures given her fever.  Patient was given fluid bolus in the ED.  However given that her lactate was not elevated at 4 and she was not hypotensive patient was not started on sepsis protocol.  I did start patient on IV Rocephin for UTI.  Doubt pyelonephritis.  Given patient's new onset A. fib with RVR she will be given diltiazem to control her rate.  Drip was given.  Patient denies any chest pain or shortness of breath.  Clinical presentation not consistent with ACS, PE, pneumonia, dissection.  Will consult for admission and given new onset A. fib with RVR along with UTI.  I spoke with Dr. Selena BattenKim with hospital medicine who agrees to admission will see patient in the ED and place admission orders.   Patient remains hemodynamically stable this time.  Updated on plan of care.  Patient was also seen by my attending who is agreed with the above plan.  Final Clinical Impressions(s) / ED Diagnoses   Final diagnoses:  Fever, unspecified  Dysuria  Atrial fibrillation, unspecified type [facility](HCC)    ED Discharge Orders    None       Wallace KellerLeaphart, Reace Breshears T, PA-C 10/11/17 2306    Phillis HaggisMabe, Martha L, MD 10/14/17 1208

## 2017-10-11 NOTE — ED Triage Notes (Signed)
Per Pt and family, Pt is coming from home with complaints of cough and congestion x 3 days. Nausea, vomiting, and diarrhea started today. Pt is noted to have a fever. Reports generalized fatigue.

## 2017-10-11 NOTE — ED Notes (Signed)
Report attempted 

## 2017-10-12 ENCOUNTER — Inpatient Hospital Stay (HOSPITAL_COMMUNITY): Payer: Medicare HMO

## 2017-10-12 ENCOUNTER — Other Ambulatory Visit: Payer: Self-pay

## 2017-10-12 DIAGNOSIS — R509 Fever, unspecified: Secondary | ICD-10-CM

## 2017-10-12 DIAGNOSIS — I4891 Unspecified atrial fibrillation: Secondary | ICD-10-CM

## 2017-10-12 DIAGNOSIS — I351 Nonrheumatic aortic (valve) insufficiency: Secondary | ICD-10-CM

## 2017-10-12 DIAGNOSIS — E876 Hypokalemia: Secondary | ICD-10-CM

## 2017-10-12 LAB — RESPIRATORY PANEL BY PCR
Adenovirus: NOT DETECTED
BORDETELLA PERTUSSIS-RVPCR: NOT DETECTED
CORONAVIRUS OC43-RVPPCR: DETECTED — AB
Chlamydophila pneumoniae: NOT DETECTED
Coronavirus 229E: NOT DETECTED
Coronavirus HKU1: NOT DETECTED
Coronavirus NL63: NOT DETECTED
INFLUENZA A H1 2009-RVPPR: NOT DETECTED
INFLUENZA A H1-RVPPCR: NOT DETECTED
Influenza A H3: NOT DETECTED
Influenza A: NOT DETECTED
Influenza B: NOT DETECTED
METAPNEUMOVIRUS-RVPPCR: NOT DETECTED
Mycoplasma pneumoniae: NOT DETECTED
PARAINFLUENZA VIRUS 1-RVPPCR: NOT DETECTED
PARAINFLUENZA VIRUS 3-RVPPCR: NOT DETECTED
Parainfluenza Virus 2: NOT DETECTED
Parainfluenza Virus 4: NOT DETECTED
RESPIRATORY SYNCYTIAL VIRUS-RVPPCR: NOT DETECTED
RHINOVIRUS / ENTEROVIRUS - RVPPCR: NOT DETECTED

## 2017-10-12 LAB — ECHOCARDIOGRAM COMPLETE
Height: 62 in
WEIGHTICAEL: 1798.4 [oz_av]

## 2017-10-12 LAB — TROPONIN I
TROPONIN I: 0.03 ng/mL — AB (ref <0.03)
Troponin I: 0.07 ng/mL (ref <0.03)
Troponin I: 0.04 ng/mL (ref <0.03)

## 2017-10-12 LAB — TSH: TSH: 1.044 u[IU]/mL (ref 0.350–4.500)

## 2017-10-12 LAB — D-DIMER, QUANTITATIVE: D-Dimer, Quant: 10.9 ug/mL-FEU — ABNORMAL HIGH (ref 0.00–0.50)

## 2017-10-12 MED ORDER — IOPAMIDOL (ISOVUE-370) INJECTION 76%
INTRAVENOUS | Status: AC
  Administered 2017-10-12: 100 mL
  Filled 2017-10-12: qty 100

## 2017-10-12 MED ORDER — DILTIAZEM HCL 30 MG PO TABS
30.0000 mg | ORAL_TABLET | Freq: Four times a day (QID) | ORAL | Status: DC
  Administered 2017-10-12 – 2017-10-13 (×6): 30 mg via ORAL
  Filled 2017-10-12 (×6): qty 1

## 2017-10-12 NOTE — Progress Notes (Signed)
Pt had another pause of 2.8 sec, and HR went to 36, non sustained, MD notified and ordered to hold po Cardizem till 6 am, D-dimer was positive at 10.90, ordered CT angio per previous MD order, MD notified as well, will continue to monitor, Thanks Lavonda JumboMike F RN.

## 2017-10-12 NOTE — Progress Notes (Signed)
Patient ambulated in hall. Oxygen saturation on room air: 91-94% while ambulating.

## 2017-10-12 NOTE — Progress Notes (Signed)
Troponin 0.07, no s/s was 0.03, MD notified, will continue to monitor, Thanks Lavonda JumboMike F RN

## 2017-10-12 NOTE — Progress Notes (Signed)
CT angio negative for PE but did show a 4 cm ascending thoracic aortic aneurysm, MD notified, will continue to monitor, Thanks, Lavonda JumboMike  F RN.

## 2017-10-12 NOTE — Progress Notes (Signed)
PROGRESS NOTE    Sherry [name]  ZOX:[id number] DOB: 08-23-1929 DOA: 10/11/2017 PCP: Darrow Bussing, MD   Outpatient Specialists:     Brief Narrative:   Sherry [name]  is a 84 y.o. female, w Copd (not on home o2), , Tobacco use, Hypertension, Hyperlipidemia,  CAD s/p stent apparently presents with c/o tachycardia and bp elevation, and urinary incontinence.      Assessment & Plan:   Principal Problem:   Fever Active Problems:   Hypokalemia   Tachycardia   Atrial fibrillation with RVR (HCC)  Fever from coronovirus -symptomatic treatment -cultures pending -much improved today  Afib with RVR- back in sinus  Tele CTA negative for PE Tsh-normal Echo pending Pause while on cardizem gtt -no anticoagulation due to fall risk-- continue ASA  Hypokalemia Replete  CAD Cont aspirin, plavix, carvedilol telmisartan, lipitor  Elevated troponin -demand ischemia due to a fib      Code Status: Full Code   Family Communication: At bedside  Disposition Plan:  Home once echo back   Consultants:    Subjective: Feeling much better  Objective: Vitals:   10/11/17 2312 10/11/17 2316 10/12/17 0137 10/12/17 0522  BP:  118/81 (!) 155/74 (!) 154/67  Pulse:  97 72 70  Resp:  18  18  Temp:  98.8 F (37.1 C)  99 F (37.2 C)  TempSrc:  Oral  Oral  SpO2:  92%  96%  Weight: 50.2 kg (110 lb 11.2 oz)   51 kg (112 lb 6.4 oz)  Height: 5\' 2"  (1.575 m)       Intake/Output Summary (Last 24 hours) at 10/12/2017 1718 Last data filed at 10/12/2017 1259 Gross per 24 hour  Intake 1915 ml  Output 425 ml  Net 1490 ml   Filed Weights   10/11/17 1818 10/11/17 2312 10/12/17 0522  Weight: 54 kg (119 lb) 50.2 kg (110 lb 11.2 oz) 51 kg (112 lb 6.4 oz)    Examination:  General exam: in chair, NAD Respiratory system: no wheezing, not moving much air Cardiovascular system: rrr Gastrointestinal system: +Bs, soft Central nervous system: alert Psychiatry: mood and affect  normal    Data Reviewed: I have personally reviewed following labs and imaging studies  CBC: Recent Labs  Lab 10/11/17 1847  WBC 8.6  NEUTROABS 7.4  HGB 13.1  HCT 40.4  MCV 99.0  PLT 186   Basic Metabolic Panel: Recent Labs  Lab 10/11/17 1847  NA 137  K 3.4*  CL 101  CO2 23  GLUCOSE 131*  BUN 12  CREATININE 0.84  CALCIUM 9.2   GFR: Estimated Creatinine Clearance: 38 mL/min (by C-G formula based on SCr of 0.84 mg/dL). Liver Function Tests: Recent Labs  Lab 10/11/17 1847  AST 21  ALT 14  ALKPHOS 59  BILITOT 0.7  PROT 7.2  ALBUMIN 3.7   No results for input(s): LIPASE, AMYLASE in the last 168 hours. No results for input(s): AMMONIA in the last 168 hours. Coagulation Profile: No results for input(s): INR, PROTIME in the last 168 hours. Cardiac Enzymes: Recent Labs  Lab 10/11/17 2348 10/12/17 0548 10/12/17 1131  TROPONINI 0.03* 0.07* 0.04*   BNP (last 3 results) No results for input(s): PROBNP in the last 8760 hours. HbA1C: No results for input(s): HGBA1C in the last 72 hours. CBG: No results for input(s): GLUCAP in the last 168 hours. Lipid Profile: No results for input(s): CHOL, HDL, LDLCALC, TRIG, CHOLHDL, LDLDIRECT in the last 72 hours. Thyroid Function Tests: Recent Labs  10/11/17 2348  TSH 1.044   Anemia Panel: No results for input(s): VITAMINB12, FOLATE, FERRITIN, TIBC, IRON, RETICCTPCT in the last 72 hours. Urine analysis:    Component Value Date/Time   COLORURINE YELLOW 10/11/2017 1847   APPEARANCEUR CLEAR 10/11/2017 1847   LABSPEC 1.008 10/11/2017 1847   PHURINE 9.0 (H) 10/11/2017 1847   GLUCOSEU NEGATIVE 10/11/2017 1847   HGBUR SMALL (A) 10/11/2017 1847   BILIRUBINUR NEGATIVE 10/11/2017 1847   KETONESUR NEGATIVE 10/11/2017 1847   PROTEINUR 30 (A) 10/11/2017 1847   UROBILINOGEN 0.2 06/27/2009 1600   NITRITE POSITIVE (A) 10/11/2017 1847   LEUKOCYTESUR NEGATIVE 10/11/2017 1847    ) Recent Results (from the past 240 hour(s))   Urine culture     Status: Abnormal (Preliminary result)   Collection Time: 10/11/17  6:47 PM  Result Value Ref Range Status   Specimen Description URINE, CLEAN CATCH  Final   Special Requests NONE  Final   Culture (A)  Final    >=100,000 COLONIES/mL ESCHERICHIA COLI SUSCEPTIBILITIES TO FOLLOW Performed at [facility], [address], [place], [place] [address]    Report Status PENDING  Incomplete  Respiratory Panel by PCR     Status: Abnormal   Collection Time: 10/11/17  7:24 PM  Result Value Ref Range Status   Adenovirus NOT DETECTED NOT DETECTED Final   Coronavirus 229E NOT DETECTED NOT DETECTED Final   Coronavirus HKU1 NOT DETECTED NOT DETECTED Final   Coronavirus NL63 NOT DETECTED NOT DETECTED Final   Coronavirus OC43 DETECTED (A) NOT DETECTED Final   Metapneumovirus NOT DETECTED NOT DETECTED Final   Rhinovirus / Enterovirus NOT DETECTED NOT DETECTED Final   Influenza A NOT DETECTED NOT DETECTED Final   Influenza A H1 NOT DETECTED NOT DETECTED Final   Influenza A H1 2009 NOT DETECTED NOT DETECTED Final   Influenza A H3 NOT DETECTED NOT DETECTED Final   Influenza B NOT DETECTED NOT DETECTED Final   Parainfluenza Virus 1 NOT DETECTED NOT DETECTED Final   Parainfluenza Virus 2 NOT DETECTED NOT DETECTED Final   Parainfluenza Virus 3 NOT DETECTED NOT DETECTED Final   Parainfluenza Virus 4 NOT DETECTED NOT DETECTED Final   Respiratory Syncytial Virus NOT DETECTED NOT DETECTED Final   Bordetella pertussis NOT DETECTED NOT DETECTED Final   Chlamydophila pneumoniae NOT DETECTED NOT DETECTED Final   Mycoplasma pneumoniae NOT DETECTED NOT DETECTED Final    Comment: Performed at [facility], [address] [place], [place] [address]  Culture, blood (routine x 2)     Status: None (Preliminary result)   Collection Time: 10/11/17  7:58 PM  Result Value Ref Range Status   Specimen Description BLOOD RIGHT ANTECUBITAL  Final   Special Requests   Final    BOTTLES DRAWN  AEROBIC AND ANAEROBIC Blood Culture adequate volume   Culture   Final    NO GROWTH < 24 HOURS Performed at [facility], [address] [place], [place] [address]    Report Status PENDING  Incomplete      Anti-infectives (From admission, onward)   Start     Dose/Rate Route Frequency Ordered Stop   10/11/17 2000  cefTRIAXone (ROCEPHIN) 1 g in dextrose 5 % 50 mL IVPB     1 g 100 mL/hr over 30 Minutes Intravenous  Once 10/11/17 1958 10/11/17 2158       Radiology Studies: Dg Chest 2 View  Result Date: 10/11/2017 CLINICAL DATA:  Patient reports flu like symptoms like  cough, pain in chest with coughing X 1 week. Patients family reports they are looking for pneumonia. HX smoker EXAM: CHEST  2 VIEW COMPARISON:  06/08/2009 FINDINGS: Cardiac silhouette is mildly enlarged. No mediastinal or hilar masses. No convincing adenopathy. Lungs are hyperexpanded. There are prominent interstitial/bronchovascular markings, increased when compared to the prior CT. No lung consolidation. No pleural effusion or pneumothorax. Skeletal structures are demineralized but grossly intact. IMPRESSION: 1. No convincing pneumonia. 2. Prominent bronchovascular markings, but no convincing pulmonary edema. Hyperexpanded lungs. Findings suggest COPD. Electronically Signed   By: Amie Portlandavid  Ormond M.D.   On: 10/11/2017 19:18   Ct Angio Chest Pe W Or Wo Contrast  Result Date: 10/12/2017 CLINICAL DATA:  Atrial fibrillation, fever and tachycardia. EXAM: CT ANGIOGRAPHY CHEST WITH CONTRAST TECHNIQUE: Multidetector CT imaging of the chest was performed using the standard protocol during bolus administration of intravenous contrast. Multiplanar CT image reconstructions and MIPs were obtained to evaluate the vascular anatomy. CONTRAST:  58 cc ISOVUE-370 IOPAMIDOL (ISOVUE-370) INJECTION 76% COMPARISON:  CXR 10/11/2017. FINDINGS: Cardiovascular: Satisfactory opacification of the pulmonary arteries to the segmental level. No evidence of  pulmonary embolism. Mild cardiomegaly without pericardial effusion. Coronary arteriosclerosis is noted. There is aortic atherosclerosis with 4 cm ascending thoracic aortic aneurysm. Mediastinum/Nodes: Subcarinal calcified lymph node likely representing old granulomatous disease. No adenopathy. Slightly tortuous atherosclerotic great vessels. Midline patent trachea and mainstem bronchi. The thoracic esophagus is unremarkable. There is a small hiatal hernia. Lungs/Pleura: Upper lobe predominant centrilobular emphysema with dependent lower lobe atelectasis. No dominant mass or pneumonic consolidation. No pneumothorax. Trace bilateral pleural effusions. Upper Abdomen: Granulomata of the liver and spleen. No acute upper abdominal abnormality. Musculoskeletal: Degenerative changes are seen along the dorsal spine with mild S shaped scoliosis of the thoracolumbar spine. Mild superior endplate compression of what appears to be the T11 vertebral body. Review of the MIP images confirms the above findings. IMPRESSION: 1. 4 cm in diameter thoracic aortic aneurysm with atherosclerosis. Recommend annual imaging followup by CTA or MRA. This recommendation follows 2010 ACCF/AHA/AATS/ACR/ASA/SCA/SCAI/SIR/STS/SVM Guidelines for the Diagnosis and Management of Patients with Thoracic Aortic Disease. Circulation. 2010; 121: [id number] 2. Minimal coronary arteriosclerosis. 3. No acute pulmonary embolus. 4. Splenic, hepatic and mediastinal calcifications compatible with old granulomatous disease. 5. COPD. 6. Mild superior endplate compression of the T11 vertebral body, likely remote. No retropulsion. Aortic Atherosclerosis (ICD10-I70.0) and Emphysema (ICD10-J43.9). Electronically Signed   By: Tollie Ethavid  Kwon M.D.   On: 10/12/2017 03:46        Scheduled Meds: . aspirin  81 mg Oral Daily  . atorvastatin  40 mg Oral QHS  . cholecalciferol  1,000 Units Oral Daily  . clopidogrel  75 mg Oral Daily  . diltiazem  30 mg Oral Q6H  . docusate  sodium  100 mg Oral Daily  . irbesartan  75 mg Oral Daily  . omega-3 acid ethyl esters  1 g Oral Daily   Continuous Infusions:   LOS: 1 day    Time spent: 35 min    Joseph ArtJessica U Daelon Dunivan, DO [facility] Pager [phone]  If 7PM-7AM, please contact night-coverage [email or web address] Password [email or web address] 10/12/2017, 5:18 PM

## 2017-10-12 NOTE — Progress Notes (Signed)
Pt had 3.04 sec pause followed by a 4.33 sec pause, then converted to NSR, which ECG confirmed, MD notified, and ordered d/c of Cardizem IV, will continue to monitor, thanks Lavonda JumboMike F RN.

## 2017-10-12 NOTE — Progress Notes (Signed)
  Echocardiogram 2D Echocardiogram has been performed.  Sherry [name] T [name] 10/12/2017, 4:15 PM

## 2017-10-13 DIAGNOSIS — N390 Urinary tract infection, site not specified: Principal | ICD-10-CM

## 2017-10-13 DIAGNOSIS — J069 Acute upper respiratory infection, unspecified: Secondary | ICD-10-CM

## 2017-10-13 LAB — URINE CULTURE

## 2017-10-13 MED ORDER — HYDRALAZINE HCL 20 MG/ML IJ SOLN: 5.0000 mg | Freq: Four times a day (QID) | INTRAMUSCULAR | Status: DC | PRN

## 2017-10-13 MED ORDER — ACETAMINOPHEN 500 MG PO TABS: 1000.0000 mg | ORAL_TABLET | Freq: Three times a day (TID) | ORAL | 0 refills | Status: AC | PRN

## 2017-10-13 MED ORDER — SODIUM CHLORIDE 0.9 % IV SOLN
1.0000 g | INTRAVENOUS | Status: DC
  Administered 2017-10-13: 1 g via INTRAVENOUS
  Filled 2017-10-13: qty 10

## 2017-10-13 MED ORDER — IPRATROPIUM-ALBUTEROL 0.5-2.5 (3) MG/3ML IN SOLN: 3.0000 mL | RESPIRATORY_TRACT | 0 refills | Status: DC | PRN

## 2017-10-13 MED ORDER — IPRATROPIUM-ALBUTEROL 0.5-2.5 (3) MG/3ML IN SOLN: 3.0000 mL | Freq: Four times a day (QID) | RESPIRATORY_TRACT | Status: DC | PRN

## 2017-10-13 MED ORDER — CEPHALEXIN 500 MG PO CAPS: 500.0000 mg | ORAL_CAPSULE | Freq: Two times a day (BID) | ORAL | 0 refills | Status: DC

## 2017-10-13 MED ORDER — SODIUM CHLORIDE 0.9 % IV SOLN: 1.0000 g | INTRAVENOUS | Status: DC

## 2017-10-13 MED ORDER — IPRATROPIUM-ALBUTEROL 0.5-2.5 (3) MG/3ML IN SOLN
3.0000 mL | Freq: Four times a day (QID) | RESPIRATORY_TRACT | Status: DC
  Administered 2017-10-13: 3 mL via RESPIRATORY_TRACT
  Filled 2017-10-13: qty 3

## 2017-10-13 MED ORDER — DILTIAZEM HCL ER COATED BEADS 180 MG PO CP24: 180.0000 mg | ORAL_CAPSULE | Freq: Every day | ORAL | 0 refills | Status: DC

## 2017-10-13 NOTE — Progress Notes (Signed)
BP elevated, MD paged and PRN medication ordered. Last SPB 191 & MD was fine with that. Orders to give PRN id SPB >[id number].

## 2017-10-13 NOTE — Progress Notes (Signed)
Patient states it burns when she urinates.

## 2017-10-13 NOTE — Discharge Summary (Signed)
Physician Discharge Summary  [name] OZH:[id number] DOB: [birth year] DOA: 10/11/2017  PCP: Darrow Bussing, MD  Admit date: 10/11/2017 Discharge date: 10/13/2017   Recommendations for Outpatient Follow-Up:   Follow imaging for 4 cm in diameter thoracic aortic aneurysm with atherosclerosis. Recommend annual imaging followup by CTA or MRA Supervision by family Home health PT Consider NOAC if patient becomes more mobile PFTs- add inhalers once stage known   Discharge Diagnosis:   Principal Problem:   Fever Active Problems:   Hypokalemia   Tachycardia   Atrial fibrillation with RVR [facility])   Discharge disposition:  Home:  Discharge Condition: Improved.  Diet recommendation: Low sodium, heart healthy.  Carbohydrate-modified.    Wound care: None.   History of Present Illness:   Sherry [name]  is a 84 y.o. female, w Copd (not on home o2), , Tobacco use, Hypertension, Hyperlipidemia,  CAD s/p stent apparently presents with c/o tachycardia and bp elevation, and urinary incontinence.      Hospital Course by Problem:   Fever from coronovirus -symptomatic treatment -much improved  -nebs -O2 sats stable  UTI-ecoli -PO abx  Afib with RVR- back in sinus  CTA negative for PE Tsh-normal Echo:Normal LV size with moderate LV hypertrophy. EF 65-70%. Normal RV   size and systolic function. Mild aortic insufficiency. Moderate   LAE. Moderate pulmonary hypertension. PO cardizem -no anticoagulation due to fall risk-- continue ASA  Hypokalemia Repleted  CAD Cont aspirin, plavix, carvedilol telmisartan, lipitor  Elevated troponin -demand ischemia due to a fib  Tobacco abuse -encourage cessation     Medical Consultants:    None.   Discharge Exam:   Vitals:   10/13/17 0816 10/13/17 0848  BP:  (!) 130/59  Pulse:    Resp:    Temp:    SpO2: 97% 97%     Gen:  NAD    The results of significant diagnostics from this hospitalization (including  imaging, microbiology, ancillary and laboratory) are listed below for reference.     Procedures and Diagnostic Studies:   Dg Chest 2 View  Result Date: 10/11/2017 CLINICAL DATA:  Patient reports flu like symptoms like cough, pain in chest with coughing X 1 week. Patients family reports they are looking for pneumonia. HX smoker EXAM: CHEST  2 VIEW COMPARISON:  06/08/2009 FINDINGS: Cardiac silhouette is mildly enlarged. No mediastinal or hilar masses. No convincing adenopathy. Lungs are hyperexpanded. There are prominent interstitial/bronchovascular markings, increased when compared to the prior CT. No lung consolidation. No pleural effusion or pneumothorax. Skeletal structures are demineralized but grossly intact. IMPRESSION: 1. No convincing pneumonia. 2. Prominent bronchovascular markings, but no convincing pulmonary edema. Hyperexpanded lungs. Findings suggest COPD. Electronically Signed   By: Amie Portland M.D.   On: 10/11/2017 19:18   Ct Angio Chest Pe W Or Wo Contrast  Result Date: 10/12/2017 CLINICAL DATA:  Atrial fibrillation, fever and tachycardia. EXAM: CT ANGIOGRAPHY CHEST WITH CONTRAST TECHNIQUE: Multidetector CT imaging of the chest was performed using the standard protocol during bolus administration of intravenous contrast. Multiplanar CT image reconstructions and MIPs were obtained to evaluate the vascular anatomy. CONTRAST:  58 cc ISOVUE-370 IOPAMIDOL (ISOVUE-370) INJECTION 76% COMPARISON:  CXR 10/11/2017. FINDINGS: Cardiovascular: Satisfactory opacification of the pulmonary arteries to the segmental level. No evidence of pulmonary embolism. Mild cardiomegaly without pericardial effusion. Coronary arteriosclerosis is noted. There is aortic atherosclerosis with 4 cm ascending thoracic aortic aneurysm. Mediastinum/Nodes: Subcarinal calcified lymph node likely representing old granulomatous disease. No adenopathy. Slightly tortuous atherosclerotic great vessels. Midline  patent trachea and  mainstem bronchi. The thoracic esophagus is unremarkable. There is a small hiatal hernia. Lungs/Pleura: Upper lobe predominant centrilobular emphysema with dependent lower lobe atelectasis. No dominant mass or pneumonic consolidation. No pneumothorax. Trace bilateral pleural effusions. Upper Abdomen: Granulomata of the liver and spleen. No acute upper abdominal abnormality. Musculoskeletal: Degenerative changes are seen along the dorsal spine with mild S shaped scoliosis of the thoracolumbar spine. Mild superior endplate compression of what appears to be the T11 vertebral body. Review of the MIP images confirms the above findings. IMPRESSION: 1. 4 cm in diameter thoracic aortic aneurysm with atherosclerosis. Recommend annual imaging followup by CTA or MRA. This recommendation follows 2010 ACCF/AHA/AATS/ACR/ASA/SCA/SCAI/SIR/STS/SVM Guidelines for the Diagnosis and Management of Patients with Thoracic Aortic Disease. Circulation. 2010; 121: [id number] 2. Minimal coronary arteriosclerosis. 3. No acute pulmonary embolus. 4. Splenic, hepatic and mediastinal calcifications compatible with old granulomatous disease. 5. COPD. 6. Mild superior endplate compression of the T11 vertebral body, likely remote. No retropulsion. Aortic Atherosclerosis (ICD10-I70.0) and Emphysema (ICD10-J43.9). Electronically Signed   By: Tollie Ethavid  Kwon M.D.   On: 10/12/2017 03:46     Labs:   Basic Metabolic Panel: Recent Labs  Lab 10/11/17 1847  NA 137  K 3.4*  CL 101  CO2 23  GLUCOSE 131*  BUN 12  CREATININE 0.84  CALCIUM 9.2   GFR Estimated Creatinine Clearance: 37.9 mL/min (by C-G formula based on SCr of 0.84 mg/dL). Liver Function Tests: Recent Labs  Lab 10/11/17 1847  AST 21  ALT 14  ALKPHOS 59  BILITOT 0.7  PROT 7.2  ALBUMIN 3.7   No results for input(s): LIPASE, AMYLASE in the last 168 hours. No results for input(s): AMMONIA in the last 168 hours. Coagulation profile No results for input(s): INR, PROTIME in the  last 168 hours.  CBC: Recent Labs  Lab 10/11/17 1847  WBC 8.6  NEUTROABS 7.4  HGB 13.1  HCT 40.4  MCV 99.0  PLT 186   Cardiac Enzymes: Recent Labs  Lab 10/11/17 2348 10/12/17 0548 10/12/17 1131  TROPONINI 0.03* 0.07* 0.04*   BNP: Invalid input(s): POCBNP CBG: No results for input(s): GLUCAP in the last 168 hours. D-Dimer Recent Labs    10/11/17 2348  DDIMER 10.90*   Hgb A1c No results for input(s): HGBA1C in the last 72 hours. Lipid Profile No results for input(s): CHOL, HDL, LDLCALC, TRIG, CHOLHDL, LDLDIRECT in the last 72 hours. Thyroid function studies Recent Labs    10/11/17 2348  TSH 1.044   Anemia work up No results for input(s): VITAMINB12, FOLATE, FERRITIN, TIBC, IRON, RETICCTPCT in the last 72 hours. Microbiology Recent Results (from the past 240 hour(s))  Urine culture     Status: Abnormal   Collection Time: 10/11/17  6:47 PM  Result Value Ref Range Status   Specimen Description URINE, CLEAN CATCH  Final   Special Requests   Final    NONE Performed at [facility], [address] [place], [place] [address]    Culture >=100,000 COLONIES/mL ESCHERICHIA COLI (A)  Final   Report Status 10/13/2017 FINAL  Final   Organism ID, Bacteria ESCHERICHIA COLI (A)  Final      Susceptibility   Escherichia coli - MIC*    AMPICILLIN <=2 SENSITIVE Sensitive     CEFAZOLIN <=4 SENSITIVE Sensitive     CEFTRIAXONE <=1 SENSITIVE Sensitive     CIPROFLOXACIN <=0.25 SENSITIVE Sensitive     GENTAMICIN <=1 SENSITIVE Sensitive     IMIPENEM <=0.25 SENSITIVE Sensitive  NITROFURANTOIN <=16 SENSITIVE Sensitive     TRIMETH/SULFA <=20 SENSITIVE Sensitive     AMPICILLIN/SULBACTAM <=2 SENSITIVE Sensitive     PIP/TAZO <=4 SENSITIVE Sensitive     Extended ESBL NEGATIVE Sensitive     * >=100,000 COLONIES/mL ESCHERICHIA COLI  Respiratory Panel by PCR     Status: Abnormal   Collection Time: 10/11/17  7:24 PM  Result Value Ref Range Status   Adenovirus NOT DETECTED  NOT DETECTED Final   Coronavirus 229E NOT DETECTED NOT DETECTED Final   Coronavirus HKU1 NOT DETECTED NOT DETECTED Final   Coronavirus NL63 NOT DETECTED NOT DETECTED Final   Coronavirus OC43 DETECTED (A) NOT DETECTED Final   Metapneumovirus NOT DETECTED NOT DETECTED Final   Rhinovirus / Enterovirus NOT DETECTED NOT DETECTED Final   Influenza A NOT DETECTED NOT DETECTED Final   Influenza A H1 NOT DETECTED NOT DETECTED Final   Influenza A H1 2009 NOT DETECTED NOT DETECTED Final   Influenza A H3 NOT DETECTED NOT DETECTED Final   Influenza B NOT DETECTED NOT DETECTED Final   Parainfluenza Virus 1 NOT DETECTED NOT DETECTED Final   Parainfluenza Virus 2 NOT DETECTED NOT DETECTED Final   Parainfluenza Virus 3 NOT DETECTED NOT DETECTED Final   Parainfluenza Virus 4 NOT DETECTED NOT DETECTED Final   Respiratory Syncytial Virus NOT DETECTED NOT DETECTED Final   Bordetella pertussis NOT DETECTED NOT DETECTED Final   Chlamydophila pneumoniae NOT DETECTED NOT DETECTED Final   Mycoplasma pneumoniae NOT DETECTED NOT DETECTED Final    Comment: Performed at [facility], [address] [place], [place] [address]  Culture, blood (routine x 2)     Status: None (Preliminary result)   Collection Time: 10/11/17  7:58 PM  Result Value Ref Range Status   Specimen Description BLOOD RIGHT ANTECUBITAL  Final   Special Requests   Final    BOTTLES DRAWN AEROBIC AND ANAEROBIC Blood Culture adequate volume   Culture   Final    NO GROWTH 2 DAYS Performed at [facility], [address] [place], [place] [address]    Report Status PENDING  Incomplete  Culture, blood (routine x 2)     Status: None (Preliminary result)   Collection Time: 10/12/17 12:01 AM  Result Value Ref Range Status   Specimen Description BLOOD LEFT ANTECUBITAL  Final   Special Requests IN PEDIATRIC BOTTLE Blood Culture adequate volume  Final   Culture   Final    NO GROWTH 1 DAY Performed at [facility], [address], [place], [place] [address]    Report Status PENDING  Incomplete     Discharge Instructions:    Allergies as of 10/13/2017   No Known Allergies     Medication List    STOP taking these medications   carvedilol 12.5 MG tablet Commonly known as:  COREG     TAKE these medications   acetaminophen 500 MG tablet Commonly known as:  TYLENOL Take 2 tablets (1,000 mg total) by mouth every 8 (eight) hours as needed for mild pain, fever or headache. What changed:    how much to take  when to take this  reasons to take this   aspirin 81 MG chewable tablet Chew 81 mg by mouth daily.   atorvastatin 40 MG tablet Commonly known as:  LIPITOR Take 40 mg by mouth at bedtime.   cephALEXin 500 MG capsule Commonly known as:  KEFLEX Take 1 capsule (500 mg total) by mouth 2 (  two) times daily.   cholecalciferol 1000 units tablet Commonly known as:  VITAMIN D Take 1,000 Units by mouth daily.   clopidogrel 75 MG tablet Commonly known as:  PLAVIX Take 75 mg by mouth daily.   diltiazem 180 MG 24 hr capsule Commonly known as:  CARDIZEM CD Take 1 capsule (180 mg total) by mouth daily.   docusate sodium 100 MG capsule Commonly known as:  COLACE Take 100 mg by mouth daily.   ipratropium-albuterol 0.5-2.5 (3) MG/3ML Soln Commonly known as:  DUONEB Take 3 mLs by nebulization every 4 (four) hours as needed (sob/wheezing).   omega-3 acid ethyl esters 1 g capsule Commonly known as:  LOVAZA Take 1 g by mouth daily.   telmisartan 20 MG tablet Commonly known as:  MICARDIS Take 20 mg by mouth daily.            Durable Medical Equipment  (From admission, onward)        Start     Ordered   10/13/17 1234  For home use only DME Nebulizer machine  Once    Question:  Patient needs a nebulizer to treat with the following condition  Answer:  COPD (chronic obstructive pulmonary disease) (HCC)   10/13/17 1233     Follow-up Information    Koirala, Dibas, MD Follow up in 1 week(s).     Specialty:  Family Medicine Contact information: [address] [place] [address] [phone]            Time coordinating discharge: 35 min  Signed:  Joseph Art   [facility] 10/13/2017, 12:34 PM

## 2017-10-13 NOTE — Consult Note (Signed)
   [facility] CM Inpatient Consult   10/13/2017  Sherry [name] 07/22/1929 [id number]  Patient screened for potential [facility] services. Patient is in the [place] of the [place] [facility] services under patient's [facility] plan. Met with the patient and  her daughter Jacqulyn Cane regarding [facility] post hospital follow up for COPD EMMI calls.  Information given with 24 hour nurse advise line.  Patient admitted with COPD exacerbation. For questions contact:   [name], RN BSN [facility] Liaison  [phone] business mobile phone Toll free office [phone]

## 2017-10-13 NOTE — Progress Notes (Signed)
Patient seems more confused this morning, keeps saying she's going to her room and that she's at her house. Patient reoriented.

## 2017-10-13 NOTE — Care Management Note (Signed)
Case Management Note  Patient Details  Name: Sherry [name] MRN: [id number] Date of Birth: 1928-12-11  Subjective/Objective:    Fever               Action/Plan: Patient lives at home with her daughter; PCP: Darrow BussingKoirala, Dibas, MD; has private insurance with [employer] with prescription drug coverage; HHC choice offered, pt chose [facility]; [facility]; Nebulizer machine order, she does not want a walker, states that she has one at home.   Expected Discharge Date:  10/13/17               Expected Discharge Plan:  Home w Home Health Services  Discharge planning Services  CM Consult   Choice offered to:  Patient, Adult Children  DME Arranged:  Nebulizer machine DME Agency:  [facility]  HH Arranged[facility]  [facility]  [facility]  Status of Service:  In process, will continue to follow  Reola MosherChandler, Lyssa Hackley L, RN,MHA,BSN [phone] 10/13/2017, 1:57 PM

## 2017-10-13 NOTE — Plan of Care (Signed)
  Education: Knowledge of General Education information will improve 10/13/2017 0234 - Progressing by Jeanella Flatteryhomas, Brisha Mccabe T, RN 10/13/2017 0229 - Progressing by Jeanella Flatteryhomas, Keilany Burnette T, RN   Health Behavior/Discharge Planning: Ability to manage health-related needs will improve 10/13/2017 0234 - Progressing by Jeanella Flatteryhomas, Johnathan Tortorelli T, RN 10/13/2017 0229 - Progressing by Jeanella Flatteryhomas, Verlene Glantz T, RN   Elimination: Will not experience complications related to bowel motility 10/13/2017 0234 - Progressing by Jeanella Flatteryhomas, Dara Camargo T, RN 10/13/2017 0229 - Progressing by Jeanella Flatteryhomas, Vanetta Rule T, RN   Pain Managment: General experience of comfort will improve 10/13/2017 0234 - Progressing by Jeanella Flatteryhomas, Nakeyia Menden T, RN 10/13/2017 0229 - Progressing by Jeanella Flatteryhomas, [place] T, RN   Clinical Measurements: Respiratory complications will improve 10/13/2017 0234 - Progressing by Jeanella Flatteryhomas, Jayanna Kroeger T, RN 10/13/2017 0229 - Progressing by Jeanella Flatteryhomas, Lawanda Holzheimer T, RN

## 2017-10-13 NOTE — Progress Notes (Signed)
Patient was on room air during the beginning of shift, O2 sats around midnight 87-88% on room air- applied 2L O2 via nasal cannula. Patient took oxygen off around 0300 and rechecked O2 on room air about an hour later- O2 sats 88%. Reapplied oxygen.

## 2017-10-16 LAB — CULTURE, BLOOD (ROUTINE X 2)
CULTURE: NO GROWTH
SPECIAL REQUESTS: ADEQUATE

## 2017-10-17 DIAGNOSIS — I4891 Unspecified atrial fibrillation: Secondary | ICD-10-CM | POA: Diagnosis not present

## 2017-10-17 DIAGNOSIS — N390 Urinary tract infection, site not specified: Secondary | ICD-10-CM | POA: Diagnosis not present

## 2017-10-17 DIAGNOSIS — B342 Coronavirus infection, unspecified: Secondary | ICD-10-CM | POA: Diagnosis not present

## 2017-10-17 DIAGNOSIS — Z7984 Long term (current) use of oral hypoglycemic drugs: Secondary | ICD-10-CM | POA: Diagnosis not present

## 2017-10-17 DIAGNOSIS — I10 Essential (primary) hypertension: Secondary | ICD-10-CM | POA: Diagnosis not present

## 2017-10-17 DIAGNOSIS — J449 Chronic obstructive pulmonary disease, unspecified: Secondary | ICD-10-CM | POA: Diagnosis not present

## 2017-10-17 DIAGNOSIS — M1990 Unspecified osteoarthritis, unspecified site: Secondary | ICD-10-CM | POA: Diagnosis not present

## 2017-10-17 DIAGNOSIS — I2510 Atherosclerotic heart disease of native coronary artery without angina pectoris: Secondary | ICD-10-CM | POA: Diagnosis not present

## 2017-10-17 DIAGNOSIS — E785 Hyperlipidemia, unspecified: Secondary | ICD-10-CM | POA: Diagnosis not present

## 2017-10-17 LAB — CULTURE, BLOOD (ROUTINE X 2)
Culture: NO GROWTH
SPECIAL REQUESTS: ADEQUATE

## 2017-10-20 DIAGNOSIS — E876 Hypokalemia: Secondary | ICD-10-CM | POA: Diagnosis not present

## 2017-10-20 DIAGNOSIS — I10 Essential (primary) hypertension: Secondary | ICD-10-CM | POA: Diagnosis not present

## 2017-10-20 DIAGNOSIS — R350 Frequency of micturition: Secondary | ICD-10-CM | POA: Diagnosis not present

## 2017-10-20 DIAGNOSIS — I480 Paroxysmal atrial fibrillation: Secondary | ICD-10-CM | POA: Diagnosis not present

## 2017-10-27 DIAGNOSIS — E876 Hypokalemia: Secondary | ICD-10-CM | POA: Diagnosis not present

## 2017-10-28 DIAGNOSIS — E785 Hyperlipidemia, unspecified: Secondary | ICD-10-CM | POA: Diagnosis not present

## 2017-10-28 DIAGNOSIS — N390 Urinary tract infection, site not specified: Secondary | ICD-10-CM | POA: Diagnosis not present

## 2017-10-28 DIAGNOSIS — Z7984 Long term (current) use of oral hypoglycemic drugs: Secondary | ICD-10-CM | POA: Diagnosis not present

## 2017-10-28 DIAGNOSIS — I10 Essential (primary) hypertension: Secondary | ICD-10-CM | POA: Diagnosis not present

## 2017-10-28 DIAGNOSIS — I4891 Unspecified atrial fibrillation: Secondary | ICD-10-CM | POA: Diagnosis not present

## 2017-10-28 DIAGNOSIS — M1990 Unspecified osteoarthritis, unspecified site: Secondary | ICD-10-CM | POA: Diagnosis not present

## 2017-10-28 DIAGNOSIS — B342 Coronavirus infection, unspecified: Secondary | ICD-10-CM | POA: Diagnosis not present

## 2017-10-28 DIAGNOSIS — J449 Chronic obstructive pulmonary disease, unspecified: Secondary | ICD-10-CM | POA: Diagnosis not present

## 2017-10-28 DIAGNOSIS — I2510 Atherosclerotic heart disease of native coronary artery without angina pectoris: Secondary | ICD-10-CM | POA: Diagnosis not present

## 2017-10-29 DIAGNOSIS — I2510 Atherosclerotic heart disease of native coronary artery without angina pectoris: Secondary | ICD-10-CM | POA: Diagnosis not present

## 2017-10-29 DIAGNOSIS — M1990 Unspecified osteoarthritis, unspecified site: Secondary | ICD-10-CM | POA: Diagnosis not present

## 2017-10-29 DIAGNOSIS — B342 Coronavirus infection, unspecified: Secondary | ICD-10-CM | POA: Diagnosis not present

## 2017-10-29 DIAGNOSIS — N390 Urinary tract infection, site not specified: Secondary | ICD-10-CM | POA: Diagnosis not present

## 2017-10-29 DIAGNOSIS — I4891 Unspecified atrial fibrillation: Secondary | ICD-10-CM | POA: Diagnosis not present

## 2017-10-29 DIAGNOSIS — I10 Essential (primary) hypertension: Secondary | ICD-10-CM | POA: Diagnosis not present

## 2017-10-29 DIAGNOSIS — J449 Chronic obstructive pulmonary disease, unspecified: Secondary | ICD-10-CM | POA: Diagnosis not present

## 2017-10-29 DIAGNOSIS — E785 Hyperlipidemia, unspecified: Secondary | ICD-10-CM | POA: Diagnosis not present

## 2017-10-29 DIAGNOSIS — Z7984 Long term (current) use of oral hypoglycemic drugs: Secondary | ICD-10-CM | POA: Diagnosis not present

## 2017-11-03 DIAGNOSIS — I4891 Unspecified atrial fibrillation: Secondary | ICD-10-CM | POA: Diagnosis not present

## 2017-11-03 DIAGNOSIS — N390 Urinary tract infection, site not specified: Secondary | ICD-10-CM | POA: Diagnosis not present

## 2017-11-03 DIAGNOSIS — I2510 Atherosclerotic heart disease of native coronary artery without angina pectoris: Secondary | ICD-10-CM | POA: Diagnosis not present

## 2017-11-03 DIAGNOSIS — E785 Hyperlipidemia, unspecified: Secondary | ICD-10-CM | POA: Diagnosis not present

## 2017-11-03 DIAGNOSIS — Z7984 Long term (current) use of oral hypoglycemic drugs: Secondary | ICD-10-CM | POA: Diagnosis not present

## 2017-11-03 DIAGNOSIS — I10 Essential (primary) hypertension: Secondary | ICD-10-CM | POA: Diagnosis not present

## 2017-11-03 DIAGNOSIS — J449 Chronic obstructive pulmonary disease, unspecified: Secondary | ICD-10-CM | POA: Diagnosis not present

## 2017-11-03 DIAGNOSIS — B342 Coronavirus infection, unspecified: Secondary | ICD-10-CM | POA: Diagnosis not present

## 2017-11-03 DIAGNOSIS — M1990 Unspecified osteoarthritis, unspecified site: Secondary | ICD-10-CM | POA: Diagnosis not present

## 2017-11-05 DIAGNOSIS — I2510 Atherosclerotic heart disease of native coronary artery without angina pectoris: Secondary | ICD-10-CM | POA: Diagnosis not present

## 2017-11-05 DIAGNOSIS — J449 Chronic obstructive pulmonary disease, unspecified: Secondary | ICD-10-CM | POA: Diagnosis not present

## 2017-11-05 DIAGNOSIS — E785 Hyperlipidemia, unspecified: Secondary | ICD-10-CM | POA: Diagnosis not present

## 2017-11-05 DIAGNOSIS — M1990 Unspecified osteoarthritis, unspecified site: Secondary | ICD-10-CM | POA: Diagnosis not present

## 2017-11-05 DIAGNOSIS — I10 Essential (primary) hypertension: Secondary | ICD-10-CM | POA: Diagnosis not present

## 2017-11-05 DIAGNOSIS — B342 Coronavirus infection, unspecified: Secondary | ICD-10-CM | POA: Diagnosis not present

## 2017-11-05 DIAGNOSIS — I4891 Unspecified atrial fibrillation: Secondary | ICD-10-CM | POA: Diagnosis not present

## 2017-11-05 DIAGNOSIS — Z7984 Long term (current) use of oral hypoglycemic drugs: Secondary | ICD-10-CM | POA: Diagnosis not present

## 2017-11-05 DIAGNOSIS — N390 Urinary tract infection, site not specified: Secondary | ICD-10-CM | POA: Diagnosis not present

## 2017-11-10 DIAGNOSIS — R269 Unspecified abnormalities of gait and mobility: Secondary | ICD-10-CM | POA: Diagnosis not present

## 2017-11-10 DIAGNOSIS — R0602 Shortness of breath: Secondary | ICD-10-CM | POA: Diagnosis not present

## 2017-11-10 DIAGNOSIS — J449 Chronic obstructive pulmonary disease, unspecified: Secondary | ICD-10-CM | POA: Diagnosis not present

## 2017-11-11 DIAGNOSIS — E785 Hyperlipidemia, unspecified: Secondary | ICD-10-CM | POA: Diagnosis not present

## 2017-11-11 DIAGNOSIS — M1990 Unspecified osteoarthritis, unspecified site: Secondary | ICD-10-CM | POA: Diagnosis not present

## 2017-11-11 DIAGNOSIS — I2510 Atherosclerotic heart disease of native coronary artery without angina pectoris: Secondary | ICD-10-CM | POA: Diagnosis not present

## 2017-11-11 DIAGNOSIS — B342 Coronavirus infection, unspecified: Secondary | ICD-10-CM | POA: Diagnosis not present

## 2017-11-11 DIAGNOSIS — J449 Chronic obstructive pulmonary disease, unspecified: Secondary | ICD-10-CM | POA: Diagnosis not present

## 2017-11-11 DIAGNOSIS — I10 Essential (primary) hypertension: Secondary | ICD-10-CM | POA: Diagnosis not present

## 2017-11-11 DIAGNOSIS — N390 Urinary tract infection, site not specified: Secondary | ICD-10-CM | POA: Diagnosis not present

## 2017-11-11 DIAGNOSIS — Z7984 Long term (current) use of oral hypoglycemic drugs: Secondary | ICD-10-CM | POA: Diagnosis not present

## 2017-11-11 DIAGNOSIS — I4891 Unspecified atrial fibrillation: Secondary | ICD-10-CM | POA: Diagnosis not present

## 2017-11-12 DIAGNOSIS — I2510 Atherosclerotic heart disease of native coronary artery without angina pectoris: Secondary | ICD-10-CM | POA: Diagnosis not present

## 2017-11-12 DIAGNOSIS — J449 Chronic obstructive pulmonary disease, unspecified: Secondary | ICD-10-CM | POA: Diagnosis not present

## 2017-11-12 DIAGNOSIS — I10 Essential (primary) hypertension: Secondary | ICD-10-CM | POA: Diagnosis not present

## 2017-11-12 DIAGNOSIS — E785 Hyperlipidemia, unspecified: Secondary | ICD-10-CM | POA: Diagnosis not present

## 2017-11-12 DIAGNOSIS — N390 Urinary tract infection, site not specified: Secondary | ICD-10-CM | POA: Diagnosis not present

## 2017-11-12 DIAGNOSIS — Z7984 Long term (current) use of oral hypoglycemic drugs: Secondary | ICD-10-CM | POA: Diagnosis not present

## 2017-11-12 DIAGNOSIS — I4891 Unspecified atrial fibrillation: Secondary | ICD-10-CM | POA: Diagnosis not present

## 2017-11-12 DIAGNOSIS — B342 Coronavirus infection, unspecified: Secondary | ICD-10-CM | POA: Diagnosis not present

## 2017-11-12 DIAGNOSIS — M1990 Unspecified osteoarthritis, unspecified site: Secondary | ICD-10-CM | POA: Diagnosis not present

## 2017-11-13 DIAGNOSIS — E785 Hyperlipidemia, unspecified: Secondary | ICD-10-CM | POA: Diagnosis not present

## 2017-11-13 DIAGNOSIS — B342 Coronavirus infection, unspecified: Secondary | ICD-10-CM | POA: Diagnosis not present

## 2017-11-13 DIAGNOSIS — I10 Essential (primary) hypertension: Secondary | ICD-10-CM | POA: Diagnosis not present

## 2017-11-13 DIAGNOSIS — I4891 Unspecified atrial fibrillation: Secondary | ICD-10-CM | POA: Diagnosis not present

## 2017-11-13 DIAGNOSIS — I2510 Atherosclerotic heart disease of native coronary artery without angina pectoris: Secondary | ICD-10-CM | POA: Diagnosis not present

## 2017-11-13 DIAGNOSIS — N390 Urinary tract infection, site not specified: Secondary | ICD-10-CM | POA: Diagnosis not present

## 2017-11-13 DIAGNOSIS — Z7984 Long term (current) use of oral hypoglycemic drugs: Secondary | ICD-10-CM | POA: Diagnosis not present

## 2017-11-13 DIAGNOSIS — M1990 Unspecified osteoarthritis, unspecified site: Secondary | ICD-10-CM | POA: Diagnosis not present

## 2017-11-13 DIAGNOSIS — J449 Chronic obstructive pulmonary disease, unspecified: Secondary | ICD-10-CM | POA: Diagnosis not present

## 2017-11-17 DIAGNOSIS — N390 Urinary tract infection, site not specified: Secondary | ICD-10-CM | POA: Diagnosis not present

## 2017-11-17 DIAGNOSIS — B342 Coronavirus infection, unspecified: Secondary | ICD-10-CM | POA: Diagnosis not present

## 2017-11-17 DIAGNOSIS — M1990 Unspecified osteoarthritis, unspecified site: Secondary | ICD-10-CM | POA: Diagnosis not present

## 2017-11-17 DIAGNOSIS — E785 Hyperlipidemia, unspecified: Secondary | ICD-10-CM | POA: Diagnosis not present

## 2017-11-17 DIAGNOSIS — J449 Chronic obstructive pulmonary disease, unspecified: Secondary | ICD-10-CM | POA: Diagnosis not present

## 2017-11-17 DIAGNOSIS — Z7984 Long term (current) use of oral hypoglycemic drugs: Secondary | ICD-10-CM | POA: Diagnosis not present

## 2017-11-17 DIAGNOSIS — I4891 Unspecified atrial fibrillation: Secondary | ICD-10-CM | POA: Diagnosis not present

## 2017-11-17 DIAGNOSIS — I2510 Atherosclerotic heart disease of native coronary artery without angina pectoris: Secondary | ICD-10-CM | POA: Diagnosis not present

## 2017-11-17 DIAGNOSIS — I10 Essential (primary) hypertension: Secondary | ICD-10-CM | POA: Diagnosis not present

## 2017-11-19 DIAGNOSIS — Z7984 Long term (current) use of oral hypoglycemic drugs: Secondary | ICD-10-CM | POA: Diagnosis not present

## 2017-11-19 DIAGNOSIS — I4891 Unspecified atrial fibrillation: Secondary | ICD-10-CM | POA: Diagnosis not present

## 2017-11-19 DIAGNOSIS — E785 Hyperlipidemia, unspecified: Secondary | ICD-10-CM | POA: Diagnosis not present

## 2017-11-19 DIAGNOSIS — N390 Urinary tract infection, site not specified: Secondary | ICD-10-CM | POA: Diagnosis not present

## 2017-11-19 DIAGNOSIS — J449 Chronic obstructive pulmonary disease, unspecified: Secondary | ICD-10-CM | POA: Diagnosis not present

## 2017-11-19 DIAGNOSIS — I10 Essential (primary) hypertension: Secondary | ICD-10-CM | POA: Diagnosis not present

## 2017-11-19 DIAGNOSIS — I2510 Atherosclerotic heart disease of native coronary artery without angina pectoris: Secondary | ICD-10-CM | POA: Diagnosis not present

## 2017-11-19 DIAGNOSIS — B342 Coronavirus infection, unspecified: Secondary | ICD-10-CM | POA: Diagnosis not present

## 2017-11-19 DIAGNOSIS — M1990 Unspecified osteoarthritis, unspecified site: Secondary | ICD-10-CM | POA: Diagnosis not present

## 2017-11-24 DIAGNOSIS — Z7984 Long term (current) use of oral hypoglycemic drugs: Secondary | ICD-10-CM | POA: Diagnosis not present

## 2017-11-24 DIAGNOSIS — I4891 Unspecified atrial fibrillation: Secondary | ICD-10-CM | POA: Diagnosis not present

## 2017-11-24 DIAGNOSIS — I10 Essential (primary) hypertension: Secondary | ICD-10-CM | POA: Diagnosis not present

## 2017-11-24 DIAGNOSIS — J449 Chronic obstructive pulmonary disease, unspecified: Secondary | ICD-10-CM | POA: Diagnosis not present

## 2017-11-24 DIAGNOSIS — E785 Hyperlipidemia, unspecified: Secondary | ICD-10-CM | POA: Diagnosis not present

## 2017-11-24 DIAGNOSIS — B342 Coronavirus infection, unspecified: Secondary | ICD-10-CM | POA: Diagnosis not present

## 2017-11-24 DIAGNOSIS — I2510 Atherosclerotic heart disease of native coronary artery without angina pectoris: Secondary | ICD-10-CM | POA: Diagnosis not present

## 2017-11-24 DIAGNOSIS — N390 Urinary tract infection, site not specified: Secondary | ICD-10-CM | POA: Diagnosis not present

## 2017-11-24 DIAGNOSIS — M1990 Unspecified osteoarthritis, unspecified site: Secondary | ICD-10-CM | POA: Diagnosis not present

## 2017-12-01 DIAGNOSIS — I4891 Unspecified atrial fibrillation: Secondary | ICD-10-CM | POA: Diagnosis not present

## 2017-12-01 DIAGNOSIS — N390 Urinary tract infection, site not specified: Secondary | ICD-10-CM | POA: Diagnosis not present

## 2017-12-01 DIAGNOSIS — M1990 Unspecified osteoarthritis, unspecified site: Secondary | ICD-10-CM | POA: Diagnosis not present

## 2017-12-01 DIAGNOSIS — E785 Hyperlipidemia, unspecified: Secondary | ICD-10-CM | POA: Diagnosis not present

## 2017-12-01 DIAGNOSIS — I10 Essential (primary) hypertension: Secondary | ICD-10-CM | POA: Diagnosis not present

## 2017-12-01 DIAGNOSIS — I2510 Atherosclerotic heart disease of native coronary artery without angina pectoris: Secondary | ICD-10-CM | POA: Diagnosis not present

## 2017-12-01 DIAGNOSIS — J449 Chronic obstructive pulmonary disease, unspecified: Secondary | ICD-10-CM | POA: Diagnosis not present

## 2017-12-01 DIAGNOSIS — B342 Coronavirus infection, unspecified: Secondary | ICD-10-CM | POA: Diagnosis not present

## 2017-12-01 DIAGNOSIS — Z7984 Long term (current) use of oral hypoglycemic drugs: Secondary | ICD-10-CM | POA: Diagnosis not present

## 2017-12-02 DIAGNOSIS — N390 Urinary tract infection, site not specified: Secondary | ICD-10-CM | POA: Diagnosis not present

## 2017-12-02 DIAGNOSIS — Z7984 Long term (current) use of oral hypoglycemic drugs: Secondary | ICD-10-CM | POA: Diagnosis not present

## 2017-12-02 DIAGNOSIS — E785 Hyperlipidemia, unspecified: Secondary | ICD-10-CM | POA: Diagnosis not present

## 2017-12-02 DIAGNOSIS — B342 Coronavirus infection, unspecified: Secondary | ICD-10-CM | POA: Diagnosis not present

## 2017-12-02 DIAGNOSIS — M1990 Unspecified osteoarthritis, unspecified site: Secondary | ICD-10-CM | POA: Diagnosis not present

## 2017-12-02 DIAGNOSIS — I2510 Atherosclerotic heart disease of native coronary artery without angina pectoris: Secondary | ICD-10-CM | POA: Diagnosis not present

## 2017-12-02 DIAGNOSIS — I4891 Unspecified atrial fibrillation: Secondary | ICD-10-CM | POA: Diagnosis not present

## 2017-12-02 DIAGNOSIS — J449 Chronic obstructive pulmonary disease, unspecified: Secondary | ICD-10-CM | POA: Diagnosis not present

## 2017-12-02 DIAGNOSIS — I10 Essential (primary) hypertension: Secondary | ICD-10-CM | POA: Diagnosis not present

## 2017-12-11 DIAGNOSIS — R0602 Shortness of breath: Secondary | ICD-10-CM | POA: Diagnosis not present

## 2017-12-11 DIAGNOSIS — R269 Unspecified abnormalities of gait and mobility: Secondary | ICD-10-CM | POA: Diagnosis not present

## 2017-12-11 DIAGNOSIS — J449 Chronic obstructive pulmonary disease, unspecified: Secondary | ICD-10-CM | POA: Diagnosis not present

## 2018-01-07 DIAGNOSIS — N3020 Other chronic cystitis without hematuria: Secondary | ICD-10-CM | POA: Diagnosis not present

## 2018-01-07 DIAGNOSIS — R300 Dysuria: Secondary | ICD-10-CM | POA: Diagnosis not present

## 2018-01-07 DIAGNOSIS — N3941 Urge incontinence: Secondary | ICD-10-CM | POA: Diagnosis not present

## 2018-01-10 DIAGNOSIS — R269 Unspecified abnormalities of gait and mobility: Secondary | ICD-10-CM | POA: Diagnosis not present

## 2018-01-10 DIAGNOSIS — J449 Chronic obstructive pulmonary disease, unspecified: Secondary | ICD-10-CM | POA: Diagnosis not present

## 2018-01-10 DIAGNOSIS — R0602 Shortness of breath: Secondary | ICD-10-CM | POA: Diagnosis not present

## 2018-02-10 DIAGNOSIS — R0602 Shortness of breath: Secondary | ICD-10-CM | POA: Diagnosis not present

## 2018-02-10 DIAGNOSIS — J449 Chronic obstructive pulmonary disease, unspecified: Secondary | ICD-10-CM | POA: Diagnosis not present

## 2018-02-10 DIAGNOSIS — R269 Unspecified abnormalities of gait and mobility: Secondary | ICD-10-CM | POA: Diagnosis not present

## 2018-03-12 DIAGNOSIS — J449 Chronic obstructive pulmonary disease, unspecified: Secondary | ICD-10-CM | POA: Diagnosis not present

## 2018-03-12 DIAGNOSIS — R0602 Shortness of breath: Secondary | ICD-10-CM | POA: Diagnosis not present

## 2018-03-12 DIAGNOSIS — R269 Unspecified abnormalities of gait and mobility: Secondary | ICD-10-CM | POA: Diagnosis not present

## 2018-04-12 DIAGNOSIS — R0602 Shortness of breath: Secondary | ICD-10-CM | POA: Diagnosis not present

## 2018-04-12 DIAGNOSIS — J449 Chronic obstructive pulmonary disease, unspecified: Secondary | ICD-10-CM | POA: Diagnosis not present

## 2018-04-12 DIAGNOSIS — R269 Unspecified abnormalities of gait and mobility: Secondary | ICD-10-CM | POA: Diagnosis not present

## 2018-05-06 DIAGNOSIS — N3941 Urge incontinence: Secondary | ICD-10-CM | POA: Diagnosis not present

## 2018-05-06 DIAGNOSIS — R300 Dysuria: Secondary | ICD-10-CM | POA: Diagnosis not present

## 2018-05-13 DIAGNOSIS — R0602 Shortness of breath: Secondary | ICD-10-CM | POA: Diagnosis not present

## 2018-05-13 DIAGNOSIS — J449 Chronic obstructive pulmonary disease, unspecified: Secondary | ICD-10-CM | POA: Diagnosis not present

## 2018-05-13 DIAGNOSIS — R269 Unspecified abnormalities of gait and mobility: Secondary | ICD-10-CM | POA: Diagnosis not present

## 2018-06-12 DIAGNOSIS — R269 Unspecified abnormalities of gait and mobility: Secondary | ICD-10-CM | POA: Diagnosis not present

## 2018-06-12 DIAGNOSIS — J449 Chronic obstructive pulmonary disease, unspecified: Secondary | ICD-10-CM | POA: Diagnosis not present

## 2018-06-12 DIAGNOSIS — R0602 Shortness of breath: Secondary | ICD-10-CM | POA: Diagnosis not present

## 2018-07-13 DIAGNOSIS — J449 Chronic obstructive pulmonary disease, unspecified: Secondary | ICD-10-CM | POA: Diagnosis not present

## 2018-07-13 DIAGNOSIS — R0602 Shortness of breath: Secondary | ICD-10-CM | POA: Diagnosis not present

## 2018-07-13 DIAGNOSIS — R269 Unspecified abnormalities of gait and mobility: Secondary | ICD-10-CM | POA: Diagnosis not present

## 2018-08-12 DIAGNOSIS — R269 Unspecified abnormalities of gait and mobility: Secondary | ICD-10-CM | POA: Diagnosis not present

## 2018-08-12 DIAGNOSIS — R0602 Shortness of breath: Secondary | ICD-10-CM | POA: Diagnosis not present

## 2018-08-12 DIAGNOSIS — J449 Chronic obstructive pulmonary disease, unspecified: Secondary | ICD-10-CM | POA: Diagnosis not present

## 2018-09-12 DIAGNOSIS — R269 Unspecified abnormalities of gait and mobility: Secondary | ICD-10-CM | POA: Diagnosis not present

## 2018-09-12 DIAGNOSIS — R0602 Shortness of breath: Secondary | ICD-10-CM | POA: Diagnosis not present

## 2018-09-12 DIAGNOSIS — J449 Chronic obstructive pulmonary disease, unspecified: Secondary | ICD-10-CM | POA: Diagnosis not present

## 2018-09-27 DIAGNOSIS — M79605 Pain in left leg: Secondary | ICD-10-CM | POA: Diagnosis not present

## 2018-09-27 DIAGNOSIS — M79604 Pain in right leg: Secondary | ICD-10-CM | POA: Diagnosis not present

## 2018-09-27 DIAGNOSIS — H9193 Unspecified hearing loss, bilateral: Secondary | ICD-10-CM | POA: Diagnosis not present

## 2018-09-27 DIAGNOSIS — I10 Essential (primary) hypertension: Secondary | ICD-10-CM | POA: Diagnosis not present

## 2018-09-27 DIAGNOSIS — I712 Thoracic aortic aneurysm, without rupture: Secondary | ICD-10-CM | POA: Diagnosis not present

## 2018-09-27 DIAGNOSIS — Z79899 Other long term (current) drug therapy: Secondary | ICD-10-CM | POA: Diagnosis not present

## 2018-09-27 DIAGNOSIS — I739 Peripheral vascular disease, unspecified: Secondary | ICD-10-CM | POA: Diagnosis not present

## 2018-10-13 DIAGNOSIS — R269 Unspecified abnormalities of gait and mobility: Secondary | ICD-10-CM | POA: Diagnosis not present

## 2018-10-13 DIAGNOSIS — J449 Chronic obstructive pulmonary disease, unspecified: Secondary | ICD-10-CM | POA: Diagnosis not present

## 2018-10-13 DIAGNOSIS — R0602 Shortness of breath: Secondary | ICD-10-CM | POA: Diagnosis not present

## 2018-11-11 DIAGNOSIS — J449 Chronic obstructive pulmonary disease, unspecified: Secondary | ICD-10-CM | POA: Diagnosis not present

## 2018-11-23 ENCOUNTER — Telehealth: Payer: Self-pay | Admitting: *Deleted

## 2018-11-23 NOTE — Telephone Encounter (Signed)
Spoke with pt, no open wounds or leg pain at this time. Follow up scheduled out 6 weeks.

## 2018-11-24 ENCOUNTER — Ambulatory Visit: Payer: Medicare HMO | Admitting: Cardiovascular Disease

## 2018-12-12 DIAGNOSIS — J449 Chronic obstructive pulmonary disease, unspecified: Secondary | ICD-10-CM | POA: Diagnosis not present

## 2019-01-07 ENCOUNTER — Telehealth: Payer: Self-pay | Admitting: Cardiovascular Disease

## 2019-01-07 NOTE — Telephone Encounter (Signed)
LVMTCB to move appt to August

## 2019-01-10 ENCOUNTER — Telehealth: Payer: Self-pay | Admitting: *Deleted

## 2019-01-10 NOTE — Telephone Encounter (Signed)
Spoke with patient's daughter and she wanted to reschedule the patient's appointment so that the patient could see Dr. Allyson Sabal face to face. We rescheduled appointment for April 26, 2019 at 3:00 pm.

## 2019-01-11 ENCOUNTER — Ambulatory Visit: Payer: Medicare HMO | Admitting: Cardiovascular Disease

## 2019-01-11 DIAGNOSIS — J449 Chronic obstructive pulmonary disease, unspecified: Secondary | ICD-10-CM | POA: Diagnosis not present

## 2019-02-11 DIAGNOSIS — J449 Chronic obstructive pulmonary disease, unspecified: Secondary | ICD-10-CM | POA: Diagnosis not present

## 2019-03-05 ENCOUNTER — Encounter (HOSPITAL_COMMUNITY): Payer: Self-pay | Admitting: Emergency Medicine

## 2019-03-05 ENCOUNTER — Other Ambulatory Visit: Payer: Self-pay

## 2019-03-05 ENCOUNTER — Emergency Department (HOSPITAL_COMMUNITY): Payer: Medicare HMO

## 2019-03-05 ENCOUNTER — Inpatient Hospital Stay (HOSPITAL_COMMUNITY)
Admission: EM | Admit: 2019-03-05 | Discharge: 2019-03-15 | DRG: 064 | Disposition: A | Discharge status: Skilled Nursing Facility | Payer: Medicare HMO | Attending: Family Medicine | Admitting: Family Medicine

## 2019-03-05 DIAGNOSIS — R279 Unspecified lack of coordination: Secondary | ICD-10-CM | POA: Diagnosis not present

## 2019-03-05 DIAGNOSIS — I5031 Acute diastolic (congestive) heart failure: Secondary | ICD-10-CM | POA: Diagnosis present

## 2019-03-05 DIAGNOSIS — I639 Cerebral infarction, unspecified: Secondary | ICD-10-CM | POA: Diagnosis not present

## 2019-03-05 DIAGNOSIS — Z7982 Long term (current) use of aspirin: Secondary | ICD-10-CM

## 2019-03-05 DIAGNOSIS — N179 Acute kidney failure, unspecified: Secondary | ICD-10-CM | POA: Diagnosis not present

## 2019-03-05 DIAGNOSIS — E860 Dehydration: Secondary | ICD-10-CM | POA: Diagnosis present

## 2019-03-05 DIAGNOSIS — F17210 Nicotine dependence, cigarettes, uncomplicated: Secondary | ICD-10-CM | POA: Diagnosis present

## 2019-03-05 DIAGNOSIS — E785 Hyperlipidemia, unspecified: Secondary | ICD-10-CM | POA: Diagnosis present

## 2019-03-05 DIAGNOSIS — S199XXA Unspecified injury of neck, initial encounter: Secondary | ICD-10-CM | POA: Diagnosis not present

## 2019-03-05 DIAGNOSIS — I2510 Atherosclerotic heart disease of native coronary artery without angina pectoris: Secondary | ICD-10-CM | POA: Diagnosis present

## 2019-03-05 DIAGNOSIS — D649 Anemia, unspecified: Secondary | ICD-10-CM | POA: Diagnosis present

## 2019-03-05 DIAGNOSIS — I495 Sick sinus syndrome: Secondary | ICD-10-CM | POA: Diagnosis present

## 2019-03-05 DIAGNOSIS — I4891 Unspecified atrial fibrillation: Secondary | ICD-10-CM | POA: Diagnosis not present

## 2019-03-05 DIAGNOSIS — G9349 Other encephalopathy: Secondary | ICD-10-CM | POA: Diagnosis present

## 2019-03-05 DIAGNOSIS — H53461 Homonymous bilateral field defects, right side: Secondary | ICD-10-CM | POA: Diagnosis present

## 2019-03-05 DIAGNOSIS — R0902 Hypoxemia: Secondary | ICD-10-CM

## 2019-03-05 DIAGNOSIS — F0390 Unspecified dementia without behavioral disturbance: Secondary | ICD-10-CM | POA: Diagnosis present

## 2019-03-05 DIAGNOSIS — R627 Adult failure to thrive: Secondary | ICD-10-CM | POA: Diagnosis present

## 2019-03-05 DIAGNOSIS — I63532 Cerebral infarction due to unspecified occlusion or stenosis of left posterior cerebral artery: Secondary | ICD-10-CM | POA: Diagnosis not present

## 2019-03-05 DIAGNOSIS — R4182 Altered mental status, unspecified: Secondary | ICD-10-CM | POA: Diagnosis not present

## 2019-03-05 DIAGNOSIS — R2689 Other abnormalities of gait and mobility: Secondary | ICD-10-CM | POA: Diagnosis not present

## 2019-03-05 DIAGNOSIS — Z79899 Other long term (current) drug therapy: Secondary | ICD-10-CM

## 2019-03-05 DIAGNOSIS — I071 Rheumatic tricuspid insufficiency: Secondary | ICD-10-CM | POA: Diagnosis present

## 2019-03-05 DIAGNOSIS — I110 Hypertensive heart disease with heart failure: Secondary | ICD-10-CM | POA: Diagnosis present

## 2019-03-05 DIAGNOSIS — Z8619 Personal history of other infectious and parasitic diseases: Secondary | ICD-10-CM

## 2019-03-05 DIAGNOSIS — I63432 Cerebral infarction due to embolism of left posterior cerebral artery: Secondary | ICD-10-CM | POA: Diagnosis not present

## 2019-03-05 DIAGNOSIS — J440 Chronic obstructive pulmonary disease with acute lower respiratory infection: Secondary | ICD-10-CM | POA: Diagnosis not present

## 2019-03-05 DIAGNOSIS — N390 Urinary tract infection, site not specified: Secondary | ICD-10-CM | POA: Diagnosis not present

## 2019-03-05 DIAGNOSIS — R41841 Cognitive communication deficit: Secondary | ICD-10-CM | POA: Diagnosis not present

## 2019-03-05 DIAGNOSIS — Z1159 Encounter for screening for other viral diseases: Secondary | ICD-10-CM

## 2019-03-05 DIAGNOSIS — Z8249 Family history of ischemic heart disease and other diseases of the circulatory system: Secondary | ICD-10-CM

## 2019-03-05 DIAGNOSIS — R404 Transient alteration of awareness: Secondary | ICD-10-CM | POA: Diagnosis not present

## 2019-03-05 DIAGNOSIS — J189 Pneumonia, unspecified organism: Secondary | ICD-10-CM | POA: Diagnosis not present

## 2019-03-05 DIAGNOSIS — I499 Cardiac arrhythmia, unspecified: Secondary | ICD-10-CM | POA: Diagnosis not present

## 2019-03-05 DIAGNOSIS — R296 Repeated falls: Secondary | ICD-10-CM | POA: Diagnosis present

## 2019-03-05 DIAGNOSIS — Z955 Presence of coronary angioplasty implant and graft: Secondary | ICD-10-CM

## 2019-03-05 DIAGNOSIS — R29701 NIHSS score 1: Secondary | ICD-10-CM | POA: Diagnosis present

## 2019-03-05 DIAGNOSIS — E162 Hypoglycemia, unspecified: Secondary | ICD-10-CM | POA: Diagnosis not present

## 2019-03-05 DIAGNOSIS — S0990XA Unspecified injury of head, initial encounter: Secondary | ICD-10-CM | POA: Diagnosis not present

## 2019-03-05 DIAGNOSIS — E872 Acidosis: Secondary | ICD-10-CM | POA: Diagnosis present

## 2019-03-05 DIAGNOSIS — I361 Nonrheumatic tricuspid (valve) insufficiency: Secondary | ICD-10-CM | POA: Diagnosis not present

## 2019-03-05 DIAGNOSIS — Z7902 Long term (current) use of antithrombotics/antiplatelets: Secondary | ICD-10-CM

## 2019-03-05 DIAGNOSIS — R278 Other lack of coordination: Secondary | ICD-10-CM | POA: Diagnosis not present

## 2019-03-05 DIAGNOSIS — R001 Bradycardia, unspecified: Secondary | ICD-10-CM | POA: Diagnosis not present

## 2019-03-05 DIAGNOSIS — Z9181 History of falling: Secondary | ICD-10-CM | POA: Diagnosis not present

## 2019-03-05 DIAGNOSIS — H547 Unspecified visual loss: Secondary | ICD-10-CM | POA: Diagnosis present

## 2019-03-05 DIAGNOSIS — R2681 Unsteadiness on feet: Secondary | ICD-10-CM | POA: Diagnosis not present

## 2019-03-05 DIAGNOSIS — Z720 Tobacco use: Secondary | ICD-10-CM | POA: Diagnosis not present

## 2019-03-05 DIAGNOSIS — R509 Fever, unspecified: Secondary | ICD-10-CM | POA: Diagnosis not present

## 2019-03-05 DIAGNOSIS — Z96649 Presence of unspecified artificial hip joint: Secondary | ICD-10-CM | POA: Diagnosis present

## 2019-03-05 DIAGNOSIS — R1312 Dysphagia, oropharyngeal phase: Secondary | ICD-10-CM | POA: Diagnosis not present

## 2019-03-05 DIAGNOSIS — Z87440 Personal history of urinary (tract) infections: Secondary | ICD-10-CM

## 2019-03-05 DIAGNOSIS — E161 Other hypoglycemia: Secondary | ICD-10-CM | POA: Diagnosis not present

## 2019-03-05 DIAGNOSIS — Z743 Need for continuous supervision: Secondary | ICD-10-CM | POA: Diagnosis not present

## 2019-03-05 DIAGNOSIS — I5020 Unspecified systolic (congestive) heart failure: Secondary | ICD-10-CM | POA: Diagnosis not present

## 2019-03-05 DIAGNOSIS — I351 Nonrheumatic aortic (valve) insufficiency: Secondary | ICD-10-CM | POA: Diagnosis not present

## 2019-03-05 DIAGNOSIS — M6281 Muscle weakness (generalized): Secondary | ICD-10-CM | POA: Diagnosis not present

## 2019-03-05 HISTORY — DX: Chronic diastolic (congestive) heart failure: I50.32

## 2019-03-05 HISTORY — DX: Cerebral infarction, unspecified: I63.9

## 2019-03-05 HISTORY — DX: Rheumatic tricuspid insufficiency: I07.1

## 2019-03-05 HISTORY — DX: Unspecified atrial fibrillation: I48.91

## 2019-03-05 HISTORY — DX: Atherosclerotic heart disease of native coronary artery without angina pectoris: I25.10

## 2019-03-05 LAB — COMPREHENSIVE METABOLIC PANEL
ALT: 16 U/L (ref 0–44)
AST: 43 U/L — ABNORMAL HIGH (ref 15–41)
Albumin: 3.2 g/dL — ABNORMAL LOW (ref 3.5–5.0)
Alkaline Phosphatase: 58 U/L (ref 38–126)
Anion gap: 12 (ref 5–15)
BUN: 28 mg/dL — ABNORMAL HIGH (ref 8–23)
CO2: 17 mmol/L — ABNORMAL LOW (ref 22–32)
Calcium: 8.7 mg/dL — ABNORMAL LOW (ref 8.9–10.3)
Chloride: 105 mmol/L (ref 98–111)
Creatinine, Ser: 1.45 mg/dL — ABNORMAL HIGH (ref 0.44–1.00)
GFR calc Af Amer: 37 mL/min — ABNORMAL LOW (ref >60)
GFR calc non Af Amer: 32 mL/min — ABNORMAL LOW (ref >60)
Glucose, Bld: 69 mg/dL — ABNORMAL LOW (ref 70–99)
Potassium: 3.5 mmol/L (ref 3.5–5.1)
Sodium: 134 mmol/L — ABNORMAL LOW (ref 135–145)
Total Bilirubin: 0.9 mg/dL (ref 0.3–1.2)
Total Protein: 6 g/dL — ABNORMAL LOW (ref 6.5–8.1)

## 2019-03-05 LAB — CBC WITH DIFFERENTIAL/PLATELET
Abs Immature Granulocytes: 0.03 10*3/uL (ref 0.00–0.07)
Basophils Absolute: 0 10*3/uL (ref 0.0–0.1)
Basophils Relative: 0 %
Eosinophils Absolute: 0 10*3/uL (ref 0.0–0.5)
Eosinophils Relative: 0 %
HCT: 33.6 % — ABNORMAL LOW (ref 36.0–46.0)
Hemoglobin: 10.5 g/dL — ABNORMAL LOW (ref 12.0–15.0)
Immature Granulocytes: 0 %
Lymphocytes Relative: 9 %
Lymphs Abs: 0.7 10*3/uL (ref 0.7–4.0)
MCH: 26.4 pg (ref 26.0–34.0)
MCHC: 31.3 g/dL (ref 30.0–36.0)
MCV: 84.4 fL (ref 80.0–100.0)
Monocytes Absolute: 0.7 10*3/uL (ref 0.1–1.0)
Monocytes Relative: 9 %
Neutro Abs: 6.7 10*3/uL (ref 1.7–7.7)
Neutrophils Relative %: 82 %
Platelets: 228 10*3/uL (ref 150–400)
RBC: 3.98 MIL/uL (ref 3.87–5.11)
RDW: 24.6 % — ABNORMAL HIGH (ref 11.5–15.5)
WBC: 8.1 10*3/uL (ref 4.0–10.5)
nRBC: 0 % (ref 0.0–0.2)

## 2019-03-05 LAB — CBG MONITORING, ED
Glucose-Capillary: 63 mg/dL — ABNORMAL LOW (ref 70–99)
Glucose-Capillary: 179 mg/dL — ABNORMAL HIGH (ref 70–99)

## 2019-03-05 LAB — URINALYSIS, ROUTINE W REFLEX MICROSCOPIC
Bilirubin Urine: NEGATIVE
Glucose, UA: NEGATIVE mg/dL
Hgb urine dipstick: NEGATIVE
Ketones, ur: 5 mg/dL — AB
Nitrite: NEGATIVE
Protein, ur: 100 mg/dL — AB
Specific Gravity, Urine: 1.018 (ref 1.005–1.030)
pH: 5 (ref 5.0–8.0)

## 2019-03-05 LAB — PROTIME-INR
INR: 1.2 (ref 0.8–1.2)
Prothrombin Time: 14.8 seconds (ref 11.4–15.2)

## 2019-03-05 LAB — PROCALCITONIN: Procalcitonin: 0.35 ng/mL

## 2019-03-05 LAB — CK: Total CK: 268 U/L — ABNORMAL HIGH (ref 38–234)

## 2019-03-05 LAB — SARS CORONAVIRUS 2 BY RT PCR (HOSPITAL ORDER, PERFORMED IN ~~LOC~~ HOSPITAL LAB): SARS Coronavirus 2: NEGATIVE

## 2019-03-05 LAB — LACTIC ACID, PLASMA: Lactic Acid, Venous: 1.2 mmol/L (ref 0.5–1.9)

## 2019-03-05 MED ORDER — SODIUM CHLORIDE 0.9 % IV SOLN
1.0000 g | Freq: Once | INTRAVENOUS | Status: AC
  Administered 2019-03-05: 1 g via INTRAVENOUS
  Filled 2019-03-05: qty 10

## 2019-03-05 MED ORDER — ASPIRIN 300 MG RE SUPP: 300.0000 mg | Freq: Every day | RECTAL | Status: DC

## 2019-03-05 MED ORDER — ATORVASTATIN CALCIUM 40 MG PO TABS
40.0000 mg | ORAL_TABLET | Freq: Every day | ORAL | Status: DC
  Administered 2019-03-06 – 2019-03-15 (×9): 40 mg via ORAL
  Filled 2019-03-05 (×9): qty 1

## 2019-03-05 MED ORDER — DEXTROSE-NACL 5-0.9 % IV SOLN
INTRAVENOUS | Status: DC
  Administered 2019-03-05 – 2019-03-07 (×2): via INTRAVENOUS

## 2019-03-05 MED ORDER — ACETAMINOPHEN 650 MG RE SUPP
650.0000 mg | RECTAL | Status: DC | PRN
  Filled 2019-03-05: qty 1

## 2019-03-05 MED ORDER — STROKE: EARLY STAGES OF RECOVERY BOOK
Freq: Once | Status: AC
  Administered 2019-03-06: 04:00:00
  Filled 2019-03-05: qty 1

## 2019-03-05 MED ORDER — ASPIRIN 325 MG PO TABS
325.0000 mg | ORAL_TABLET | Freq: Every day | ORAL | Status: DC
  Administered 2019-03-05 – 2019-03-12 (×8): 325 mg via ORAL
  Filled 2019-03-05 (×8): qty 1

## 2019-03-05 MED ORDER — SODIUM CHLORIDE 0.9 % IV SOLN: 1.0000 g | Freq: Once | INTRAVENOUS | Status: DC

## 2019-03-05 MED ORDER — HEPARIN (PORCINE) 25000 UT/250ML-% IV SOLN
950.0000 [IU]/h | INTRAVENOUS | Status: AC
  Administered 2019-03-06: 600 [IU]/h via INTRAVENOUS
  Administered 2019-03-08: 07:00:00 950 [IU]/h via INTRAVENOUS
  Administered 2019-03-09 – 2019-03-10 (×2): 850 [IU]/h via INTRAVENOUS
  Filled 2019-03-05 (×6): qty 250

## 2019-03-05 MED ORDER — SENNOSIDES-DOCUSATE SODIUM 8.6-50 MG PO TABS: 1.0000 | ORAL_TABLET | Freq: Every evening | ORAL | Status: DC | PRN

## 2019-03-05 MED ORDER — SODIUM CHLORIDE 0.9 % IV SOLN
500.0000 mg | INTRAVENOUS | Status: DC
  Administered 2019-03-06 – 2019-03-08 (×3): 500 mg via INTRAVENOUS
  Filled 2019-03-05 (×4): qty 500

## 2019-03-05 MED ORDER — SODIUM CHLORIDE 0.9 % IV SOLN
1.0000 g | INTRAVENOUS | Status: DC
  Administered 2019-03-06 – 2019-03-08 (×3): 1 g via INTRAVENOUS
  Filled 2019-03-05 (×3): qty 10

## 2019-03-05 MED ORDER — DEXTROSE 50 % IV SOLN
1.0000 | Freq: Once | INTRAVENOUS | Status: AC
  Administered 2019-03-05: 50 mL via INTRAVENOUS

## 2019-03-05 MED ORDER — ACETAMINOPHEN 325 MG PO TABS
650.0000 mg | ORAL_TABLET | ORAL | Status: DC | PRN
  Administered 2019-03-06 – 2019-03-12 (×3): 650 mg via ORAL
  Filled 2019-03-05 (×3): qty 2

## 2019-03-05 MED ORDER — SODIUM CHLORIDE 0.9% FLUSH: 3.0000 mL | Freq: Once | INTRAVENOUS | Status: DC

## 2019-03-05 MED ORDER — DILTIAZEM HCL-DEXTROSE 100-5 MG/100ML-% IV SOLN (PREMIX)
5.0000 mg/h | INTRAVENOUS | Status: DC
  Administered 2019-03-05: 5 mg/h via INTRAVENOUS
  Administered 2019-03-06: 15 mg/h via INTRAVENOUS
  Filled 2019-03-05 (×2): qty 100

## 2019-03-05 MED ORDER — HEPARIN SODIUM (PORCINE) 5000 UNIT/ML IJ SOLN: 5000.0000 [IU] | Freq: Three times a day (TID) | INTRAMUSCULAR | Status: DC

## 2019-03-05 MED ORDER — SODIUM CHLORIDE 0.9 % IV BOLUS
1000.0000 mL | Freq: Once | INTRAVENOUS | Status: AC
  Administered 2019-03-05: 1000 mL via INTRAVENOUS

## 2019-03-05 MED ORDER — SODIUM CHLORIDE 0.9 % IV SOLN
500.0000 mg | Freq: Once | INTRAVENOUS | Status: AC
  Administered 2019-03-05: 500 mg via INTRAVENOUS
  Filled 2019-03-05: qty 500

## 2019-03-05 MED ORDER — DEXTROSE 50 % IV SOLN
INTRAVENOUS | Status: AC
  Filled 2019-03-05: qty 50

## 2019-03-05 MED ORDER — DILTIAZEM HCL-DEXTROSE 100-5 MG/100ML-% IV SOLN (PREMIX): 5.0000 mg/h | INTRAVENOUS | Status: DC

## 2019-03-05 MED ORDER — ACETAMINOPHEN 160 MG/5ML PO SOLN: 650.0000 mg | ORAL | Status: DC | PRN

## 2019-03-05 NOTE — Consult Note (Signed)
Neurology Consultation  Reason for Consult: Stroke Referring Physician: Dr. Dalene SeltzerSchlossman  CC: Falls, abnormal CT head  History is obtained from: Chart, EDP, daughter Ms. Ann over the phone  HPI: Sherry [name] is a 85 y.o. female past medical history of hypertension, hyperlipidemia, COPD, atrial fibrillation only on Plavix, presented to the emergency room for evaluation of worsening falls.  The daughter reports she has been having a fall almost every day for the past 2 weeks.  She had a fall 2 weeks ago when she hit her head as she fell from a chair and bruised her head and eye.  Since then she has been having more falls every day and that has been concerning. She has been having worsening memory over the past few months to years.  She has over the past 5 years used a walker to walk after her hip replacement surgery 5 years ago.  She requires some assistance with cooking but is able to bathe herself and take care of some of the other ADLs with minimal assistance at home. At baseline, her mentation is off and on where she is barely able to oriented to time or space but at times might be able to answer some orientation questions correctly. She is also had some trouble with UTIs and that has been treated.  Noted to be in A. fib with RVR in the ER. Denies tingling numbness weakness headache. Reports difficulty with urination and burning urination. Denies shortness of breath cough fevers.   LKW: 2 weeks ago tpa given?: no, outside the window Premorbid modified Rankin scale (mRS): 3-4  ROS:  ROS was performed and is negative except as noted in the HPI.   Past Medical History:  Diagnosis Date  . COPD (chronic obstructive pulmonary disease) (HCC)   . Hypercholesteremia   . Hypertension   Atrial fibrillation  Family History  Problem Relation Age of Onset  . Heart attack Mother    Social History:   reports that she has been smoking cigarettes. She has been smoking about 1.00 pack per day. She  has never used smokeless tobacco. She reports that she does not drink alcohol or use drugs.  Medications  Current Facility-Administered Medications:  .  dextrose 50 % solution, , , ,  .   stroke: mapping our early stages of recovery book, , Does not apply, Once, John Giovanniathore, Vasundhra, MD .  acetaminophen (TYLENOL) tablet 650 mg, 650 mg, Oral, Q4H PRN **OR** acetaminophen (TYLENOL) solution 650 mg, 650 mg, Per Tube, Q4H PRN **OR** acetaminophen (TYLENOL) suppository 650 mg, 650 mg, Rectal, Q4H PRN, John Giovanniathore, Vasundhra, MD .  aspirin suppository 300 mg, 300 mg, Rectal, Daily **OR** aspirin tablet 325 mg, 325 mg, Oral, Daily, John Giovanniathore, Vasundhra, MD .  Melene Muller[START ON 03/06/2019] atorvastatin (LIPITOR) tablet 40 mg, 40 mg, Oral, q1800, John Giovanniathore, Vasundhra, MD .  Melene Muller[START ON 03/06/2019] azithromycin (ZITHROMAX) 500 mg in sodium chloride 0.9 % 250 mL IVPB, 500 mg, Intravenous, Q24H, John Giovanniathore, Vasundhra, MD .  Melene Muller[START ON 03/06/2019] cefTRIAXone (ROCEPHIN) 1 g in sodium chloride 0.9 % 100 mL IVPB, 1 g, Intravenous, Q24H, Rathore, Vasundhra, MD .  dextrose 5 %-0.9 % sodium chloride infusion, , Intravenous, Continuous, Rathore, Vasundhra, MD .  dextrose 50 % solution 50 mL, 1 ampule, Intravenous, Once, John Giovanniathore, Vasundhra, MD .  heparin injection 5,000 Units, 5,000 Units, Subcutaneous, Q8H, Rathore, Vasundhra, MD .  senna-docusate (Senokot-S) tablet 1 tablet, 1 tablet, Oral, QHS PRN, John Giovanniathore, Vasundhra, MD .  sodium chloride flush (NS) 0.9 % injection  3 mL, 3 mL, Intravenous, Once, Shela Leff, MD  Current Outpatient Medications:  .  acetaminophen (TYLENOL) 500 MG tablet, Take 2 tablets (1,000 mg total) by mouth every 8 (eight) hours as needed for mild pain, fever or headache., Disp: 30 tablet, Rfl: 0 .  aspirin 81 MG chewable tablet, Chew 81 mg by mouth daily., Disp: , Rfl:  .  atorvastatin (LIPITOR) 40 MG tablet, Take 40 mg by mouth at bedtime., Disp: , Rfl:  .  cephALEXin (KEFLEX) 500 MG capsule, Take 1 capsule (500  mg total) by mouth 2 (two) times daily., Disp: 8 capsule, Rfl: 0 .  cholecalciferol (VITAMIN D) 1000 units tablet, Take 1,000 Units by mouth daily., Disp: , Rfl:  .  clopidogrel (PLAVIX) 75 MG tablet, Take 75 mg by mouth daily., Disp: , Rfl:  .  diltiazem (CARDIZEM CD) 180 MG 24 hr capsule, Take 1 capsule (180 mg total) by mouth daily., Disp: 30 capsule, Rfl: 0 .  docusate sodium (COLACE) 100 MG capsule, Take 100 mg by mouth daily., Disp: , Rfl:  .  ipratropium-albuterol (DUONEB) 0.5-2.5 (3) MG/3ML SOLN, Take 3 mLs by nebulization every 4 (four) hours as needed (sob/wheezing)., Disp: 360 mL, Rfl: 0 .  omega-3 acid ethyl esters (LOVAZA) 1 g capsule, Take 1 g by mouth daily., Disp: , Rfl:  .  telmisartan (MICARDIS) 20 MG tablet, Take 20 mg by mouth daily., Disp: , Rfl:   Exam: Current vital signs: BP 117/89   Pulse 82   Temp 99.8 F (37.7 C) (Oral)   Resp 20   SpO2 95%  Vital signs in last 24 hours: Temp:  [99.8 F (37.7 C)] 99.8 F (37.7 C) (07/04 1712) Pulse Rate:  [82-115] 82 (07/04 1730) Resp:  [16-22] 20 (07/04 1845) BP: (113-133)/(87-96) 117/89 (07/04 1845) SpO2:  [94 %-95 %] 95 % (07/04 1730) GENERAL:  awake, alert in NAD HEENT: - Normocephalic and right forehead and under eye bruising, dry mm, no LN++, no Thyromegally LUNGS -slightly barrel-shaped chest, clear to auscultation bilaterally with no wheezes CV - S1S2 RRR, no m/r/g, equal pulses bilaterally. ABDOMEN - Soft, nontender, nondistended with normoactive BS Ext: warm, well perfused, intact peripheral pulses, no edema  NEURO:  Mental Status: Awake, alert, oriented to self.  Could not tell me the month correctly.  Could not tell me her age correctly. Language: speech is mildly dysarthric but according to family that is her baseline.poor attention concentration.  Has some difficulty in following commands and naming but that is related mostly to her poor attention and on repeat coaching, she is able to name comprehend and  repeat. Cranial Nerves: PERRL. EOMI, right homonymous hemianopsia, no facial asymmetry facial sensation intact, hearing intact, tongue/uvula/soft palate midline, normal sternocleidomastoid and trapezius muscle strength. No evidence of tongue atrophy or fibrillations Motor: No vertical drift.  Her left leg is weak at baseline and exam is limited by pain on that leg. Tone: is normal and bulk is normal Sensation- Intact to light touch bilaterally.  Unclear if she has any extinction to double simultaneous stimulation due to her attention concentration Coordination: FTN intact bilaterally Gait- deferred  NIHSS 1a Level of Conscious.: 0 1b LOC Questions: 2 1c LOC Commands: 0 2 Best Gaze: 0 3 Visual: 2 4 Facial Palsy: 0 5a Motor Arm - left: 0 5b Motor Arm - Right: 0 6a Motor Leg - Left: 0 6b Motor Leg - Right: 0 7 Limb Ataxia: 0 8 Sensory: 0 9 Best Language: 0 10 Dysarthria: 1  11 Extinct. and Inatten.: 0 TOTAL: 3    Labs I have reviewed labs in epic and the results pertinent to this consultation are:  CBC    Component Value Date/Time   WBC 8.1 03/05/2019 1720   RBC 3.98 03/05/2019 1720   HGB 10.5 (L) 03/05/2019 1720   HCT 33.6 (L) 03/05/2019 1720   PLT 228 03/05/2019 1720   MCV 84.4 03/05/2019 1720   MCH 26.4 03/05/2019 1720   MCHC 31.3 03/05/2019 1720   RDW 24.6 (H) 03/05/2019 1720   LYMPHSABS 0.7 03/05/2019 1720   MONOABS 0.7 03/05/2019 1720   EOSABS 0.0 03/05/2019 1720   BASOSABS 0.0 03/05/2019 1720    CMP     Component Value Date/Time   NA 134 (L) 03/05/2019 1720   K 3.5 03/05/2019 1720   CL 105 03/05/2019 1720   CO2 17 (L) 03/05/2019 1720   GLUCOSE 69 (L) 03/05/2019 1720   BUN 28 (H) 03/05/2019 1720   CREATININE 1.45 (H) 03/05/2019 1720   CALCIUM 8.7 (L) 03/05/2019 1720   PROT 6.0 (L) 03/05/2019 1720   ALBUMIN 3.2 (L) 03/05/2019 1720   AST 43 (H) 03/05/2019 1720   ALT 16 03/05/2019 1720   ALKPHOS 58 03/05/2019 1720   BILITOT 0.9 03/05/2019 1720    GFRNONAA 32 (L) 03/05/2019 1720   GFRAA 37 (L) 03/05/2019 1720     Imaging I have reviewed the images obtained:  CT-scan of the brain-hypodensity in the left occipital area, likely subacute stroke. No bleed.   Assessment: 85 year old woman past history of hypertension hyperlipidemia COPD, atrial fibrillation not on anticoagulation presenting with 2 weeks worth of increasing falls with falls nearly every day and at one point fell and hit her head and bruised her eye and had as well. Examination does not reveal any focal weakness but does reveal right homonymous hemianopsia. Imaging done for evaluation in the emergency room noted to have a hypodensity in the left occipital area consistent with a subacute stroke. Also noted to be in A. fib with RVR and hypoglycemia.  Impression: Subacute ischemic stroke-likely cardioembolic A. fib Hypertension Hyperlipidemia   Recommendations: Admit for risk factor work-up Maintain on telemetry Frequent neurochecks MRI of the brain Due to borderline renal function-do an MRA of the head without and MRA of the neck without contrast. 2D echocardiogram A1c Lipid panel PT OT Speech therapy N.p.o. until cleared by bedside swallow or formal swallow evaluation Management of toxic metabolic derangements including hypoglycemia and elevated CK per primary team as you are. No need for permissive hypertension as the symptoms have now been going on for 2 weeks.  Her goal blood pressure should be between 100-1 40. If there is a need to use heparin, heparin should be used with stroke protocol-no bolus and low-level. Please consult pharmacy for heparin if needed from a cardiac standpoint. Upon discharge, a discussion about anticoagulation will have to be had with family considering that she has been having multiple falls and is an increased fall risk but that could be possibly attributed to her hemianopsia and the recent stroke.  Will defer to stroke  team.  Discussed the plan with hospitalist Dr. Loney Lohathore, and the patient's daughter Ms. Ann over the phone  Stroke team to follow.   -- Milon DikesAshish Kinnley Paulson, MD [facility] Pager: [phone] If 7pm to 7am, please call on call as listed on [facility].

## 2019-03-05 NOTE — ED Triage Notes (Addendum)
Per EMS- pt here from home for multiple complaints. Pt has had recent UTI, increased falls, AMS, low capnography reading noted by EMS. Pt was sitting outside for extended period of time?. Pt received 750CC to L hand PIV placed by EMS. CBG 80. Pt usually self sufficient. Takes Cardizem daily for afib. Unknown if pt took today, pt in afib rate of 130s. Pt daughter unclear of last normal potentially yesterday morning.   Pt endorses "it burns sometimes when she pees". Denies cough diarrhea or nausea.

## 2019-03-05 NOTE — Progress Notes (Signed)
[place] for heparin Indication: atrial fibrillation, acute CVA  Heparin Dosing Weight: 47.6 kg  Labs: Recent Labs    03/05/19 1720  HGB 10.5*  HCT 33.6*  PLT 228  LABPROT 14.8  INR 1.2  CREATININE 1.45*  CKTOTAL 268*    Assessment: 71 yof with hx of afib not on anticoagulation PTA presenting with falls, afib with RVR, subacute ischemic stroke. Pharmacy consulted to dose heparin per stroke protocol. Hg 10.5, plt wnl. No active bleed issues documented. Estimated weight in the ER 105 lbs per discussion with RN.  Goal of Therapy:  Heparin level 0.3-0.5 units/ml, no boluses Monitor platelets by anticoagulation protocol: Yes   Plan:  No bolus. Start heparin at 600 units/hr 8h heparin level Monitor daily heparin level and CBC, s/sx bleeding  Elicia Lamp, PharmD, BCPS Clinical Pharmacist 03/05/2019 8:41 PM

## 2019-03-05 NOTE — H&P (Signed)
History and Physical    Sherry Budhelma L Eads BJY:[id number] DOB: 05-04-1929 DOA: 03/05/2019  PCP: Darrow BussingKoirala, [place], MD Patient coming from: Home  Chief Complaint: Worsening falls  HPI: Sherry [name] is a 85 y.o. female with medical history significant of COPD, hypertension, hyperlipidemia, CAD status post stent, A. fib not on anticoagulation presenting to the hospital via EMS for evaluation of worsening falls.  Daughter reported that patient has been having a fall almost every day for the past 2 weeks.  She had a fall 2 weeks ago and hit her head as she fell from a chair and bruised her head and eye.  She has continued to have falls every day.  She has been having worsening memory for the past few months to years.  She has been using a walker to ambulate after hip replacement surgery 5 years ago.  She requires some assistance with cooking but is able to bathe herself and take care of some of the other ADLs with minimal assistance at home.  At baseline, she is able to answer orientation questions correctly only at times.  She has had UTIs which have been treated.  Patient knows her name and that she is at a hospital.  Appears confused and not able to provide much history.  She has no complaints.  Denies any chest pain, shortness of breath, abdominal pain, or dysuria.  No other complaints.  ED Course: A. fib with RVR with rate up to 130s.  Not hypotensive.  Not febrile.  Not hypoxic.  No leukocytosis.  Lactic acid normal.  Hemoglobin 10.5, was 13.1 a year ago.  CBG 69 upon arrival to the ED.  Bicarb 17.  Anion gap 12.  BUN 28 creatinine 1.4.  Creatinine was 0.8 a year ago.  No significant LFT abnormalities.  CK not significantly elevated.  UA with negative nitrite, small amount of leukocytes, 0-5 WBCs, and many bacteria.  Urine culture pending.  Blood culture x2 pending.  COVID-19 rapid test pending.  Chest x-ray with mild patchy lingular opacity, cannot exclude pneumonia.  Head CT with findings suspicious for  acute/subacute nonhemorrhagic infarct in the medial left occipital lobe.  CT C-spine negative for acute finding.  Brain MRI pending.  Patient received ceftriaxone, azithromycin, and 1 L normal saline bolus in the ED. Dr. Wilford CornerArora from neurology has been consulted  Review of Systems:  All systems reviewed and apart from history of presenting illness, are negative.  Past Medical History:  Diagnosis Date   COPD (chronic obstructive pulmonary disease) (HCC)    Hypercholesteremia    Hypertension     Past Surgical History:  Procedure Laterality Date   CORONARY ANGIOPLASTY WITH STENT PLACEMENT     HIP SURGERY       reports that she has been smoking cigarettes. She has been smoking about 1.00 pack per day. She has never used smokeless tobacco. She reports that she does not drink alcohol or use drugs.  No Known Allergies  Family History  Problem Relation Age of Onset   Heart attack Mother     Prior to Admission medications   Medication Sig Start Date End Date Taking? Authorizing Provider  acetaminophen (TYLENOL) 500 MG tablet Take 2 tablets (1,000 mg total) by mouth every 8 (eight) hours as needed for mild pain, fever or headache. 10/13/17   Joseph ArtVann, Jessica U, DO  aspirin 81 MG chewable tablet Chew 81 mg by mouth daily. 12/05/13   [provider]  atorvastatin (LIPITOR) 40 MG tablet Take 40  mg by mouth at bedtime. 12/22/16   [provider]  cephALEXin (KEFLEX) 500 MG capsule Take 1 capsule (500 mg total) by mouth 2 (two) times daily. 10/13/17   Joseph ArtVann, Jessica U, DO  cholecalciferol (VITAMIN D) 1000 units tablet Take 1,000 Units by mouth daily.    [provider]  clopidogrel (PLAVIX) 75 MG tablet Take 75 mg by mouth daily. 10/05/17   [provider]  diltiazem (CARDIZEM CD) 180 MG 24 hr capsule Take 1 capsule (180 mg total) by mouth daily. 10/13/17   Joseph ArtVann, Jessica U, DO  docusate sodium (COLACE) 100 MG capsule Take 100 mg by mouth daily.    [provider]  ipratropium-albuterol (DUONEB) 0.5-2.5 (3) MG/3ML SOLN Take 3 mLs by nebulization every 4 (four) hours as needed (sob/wheezing). 10/13/17   Joseph ArtVann, Jessica U, DO  omega-3 acid ethyl esters (LOVAZA) 1 g capsule Take 1 g by mouth daily.    [provider]  telmisartan (MICARDIS) 20 MG tablet Take 20 mg by mouth daily. 09/28/17   [provider]    Physical Exam: Vitals:   03/05/19 1728 03/05/19 1730 03/05/19 1830 03/05/19 1845  BP:  128/87 (!) 133/94 117/89  Pulse: (!) 104 82    Resp: 16 18 18 20   Temp:      TempSrc:      SpO2: 94% 95%      Physical Exam  Constitutional: She appears well-developed and well-nourished. No distress.  HENT:  Head: Normocephalic.  Mouth/Throat: Oropharynx is clear and moist.  Right forehead and periorbital bruise  Eyes: Right eye exhibits no discharge. Left eye exhibits no discharge.  Right homonymous hemianopia  Neck: Neck supple.  Cardiovascular: Intact distal pulses.  Irregularly irregular rhythm Rate up to 130s  Pulmonary/Chest: Effort normal and breath sounds normal. No respiratory distress. She has no wheezes. She has no rales.  Abdominal: Soft. Bowel sounds are normal. She exhibits no distension. There is no abdominal tenderness. There is no guarding.  Musculoskeletal:        General: No edema.  Neurological:  Awake and alert Knows her name and that she is at a hospital. Speech fluent, tongue midline, no facial droop. Right homonymous hemianopia Strength 5 out of 5 in bilateral upper and lower extremities. Sensation to light touch intact throughout.  Skin: Skin is warm and dry. She is not diaphoretic.     Labs on Admission: I have personally reviewed following labs and imaging studies  CBC: Recent Labs  Lab 03/05/19 1720  WBC 8.1  NEUTROABS 6.7  HGB 10.5*  HCT 33.6*  MCV 84.4  PLT 228   Basic Metabolic Panel: Recent Labs  Lab 03/05/19 1720  NA 134*  K 3.5  CL 105  CO2 17*  GLUCOSE 69*  BUN 28*    CREATININE 1.45*  CALCIUM 8.7*   GFR: CrCl cannot be calculated (Unknown ideal weight.). Liver Function Tests: Recent Labs  Lab 03/05/19 1720  AST 43*  ALT 16  ALKPHOS 58  BILITOT 0.9  PROT 6.0*  ALBUMIN 3.2*   No results for input(s): LIPASE, AMYLASE in the last 168 hours. No results for input(s): AMMONIA in the last 168 hours. Coagulation Profile: Recent Labs  Lab 03/05/19 1720  INR 1.2   Cardiac Enzymes: Recent Labs  Lab 03/05/19 1720  CKTOTAL 268*   BNP (last 3 results) No results for input(s): PROBNP in the last 8760 hours. HbA1C: No results for input(s): HGBA1C in the last 72 hours. CBG: Recent Labs  Lab 03/05/19 1942  GLUCAP 63*   Lipid Profile: No results for input(s): CHOL, HDL, LDLCALC, TRIG, CHOLHDL, LDLDIRECT in the last 72 hours. Thyroid Function Tests: No results for input(s): TSH, T4TOTAL, FREET4, T3FREE, THYROIDAB in the last 72 hours. Anemia Panel: No results for input(s): VITAMINB12, FOLATE, FERRITIN, TIBC, IRON, RETICCTPCT in the last 72 hours. Urine analysis:    Component Value Date/Time   COLORURINE AMBER (A) 03/05/2019 1730   APPEARANCEUR CLOUDY (A) 03/05/2019 1730   LABSPEC 1.018 03/05/2019 1730   PHURINE 5.0 03/05/2019 1730   GLUCOSEU NEGATIVE 03/05/2019 1730   HGBUR NEGATIVE 03/05/2019 1730   BILIRUBINUR NEGATIVE 03/05/2019 1730   KETONESUR 5 (A) 03/05/2019 1730   PROTEINUR 100 (A) 03/05/2019 1730   UROBILINOGEN 0.2 06/27/2009 1600   NITRITE NEGATIVE 03/05/2019 1730   LEUKOCYTESUR SMALL (A) 03/05/2019 1730    Radiological Exams on Admission: Ct Head Wo Contrast  Result Date: 03/05/2019 CLINICAL DATA:  Frequent falls.  Altered mental status. EXAM: CT HEAD WITHOUT CONTRAST CT CERVICAL SPINE WITHOUT CONTRAST TECHNIQUE: Multidetector CT imaging of the head and cervical spine was performed following the standard protocol without intravenous contrast. Multiplanar CT image reconstructions of the cervical spine were also generated.  COMPARISON:  None. FINDINGS: CT HEAD FINDINGS Brain: No evidence of parenchymal hemorrhage or extra-axial fluid collection. No mass lesion, mass effect, or midline shift. Loss of gray-white differentiation in medial left occipital lobe with associated mild sulcal effacement. Generalized cerebral volume loss. Nonspecific prominent subcortical and periventricular white matter hypodensity, most in keeping with chronic small vessel ischemic change. Cerebral ventricle sizes are concordant with the degree of cerebral volume loss. Vascular: No acute abnormality. Skull: No evidence of calvarial fracture. Sinuses/Orbits: The visualized paranasal sinuses are essentially clear. Other:  The mastoid air cells are unopacified. CT CERVICAL SPINE FINDINGS Alignment: Mild straightening of the cervical spine. No facet subluxation. Dens is well positioned between the lateral masses of C1. Minimal 2 mm anterolisthesis at C3-4, C4-5 and C7-T1. Skull base and vertebrae: No acute fracture. No primary bone lesion or focal pathologic process. Soft tissues and spinal canal: No prevertebral edema. No visible canal hematoma. Disc levels: Marked multilevel cervical degenerative disc disease, most prominent at C5-6. Advanced bilateral facet arthropathy. Moderate bilateral degenerative foraminal stenosis at C3-4. Severe degenerative foraminal stenosis on the right at C4-5. Mild to moderate degenerative foraminal stenosis on the left at C5-6 and C6-7. Upper chest: No acute abnormality. Other: Visualized mastoid air cells appear clear. No discrete thyroid nodules. No pathologically enlarged cervical nodes. IMPRESSION: 1. Loss of gray-white differentiation in the medial left occipital lobe with associated mild sulcal effacement, suspicious for acute/subacute nonhemorrhagic infarct. 2. No acute intracranial hemorrhage. 3. Generalized cerebral volume loss and prominent chronic small vessel ischemic changes in the cerebral white matter. 4. No cervical  spine fracture or facet subluxation. 5. Advanced degenerative changes throughout the cervical spine as detailed. Critical Value/emergent results were called by telephone at the time of interpretation on 03/05/2019 at 6:52 pm to Dr. Gareth Morgan , who verbally acknowledged these results. Electronically Signed   By: Ilona Sorrel M.D.   On: 03/05/2019 18:55   Ct Cervical Spine Wo Contrast  Result Date: 03/05/2019 CLINICAL DATA:  Frequent falls.  Altered mental status. EXAM: CT HEAD WITHOUT CONTRAST CT CERVICAL SPINE WITHOUT CONTRAST TECHNIQUE: Multidetector CT imaging of the head and cervical spine was performed following the standard protocol without intravenous contrast. Multiplanar CT image reconstructions of the cervical spine were also generated. COMPARISON:  None.  FINDINGS: CT HEAD FINDINGS Brain: No evidence of parenchymal hemorrhage or extra-axial fluid collection. No mass lesion, mass effect, or midline shift. Loss of gray-white differentiation in medial left occipital lobe with associated mild sulcal effacement. Generalized cerebral volume loss. Nonspecific prominent subcortical and periventricular white matter hypodensity, most in keeping with chronic small vessel ischemic change. Cerebral ventricle sizes are concordant with the degree of cerebral volume loss. Vascular: No acute abnormality. Skull: No evidence of calvarial fracture. Sinuses/Orbits: The visualized paranasal sinuses are essentially clear. Other:  The mastoid air cells are unopacified. CT CERVICAL SPINE FINDINGS Alignment: Mild straightening of the cervical spine. No facet subluxation. Dens is well positioned between the lateral masses of C1. Minimal 2 mm anterolisthesis at C3-4, C4-5 and C7-T1. Skull base and vertebrae: No acute fracture. No primary bone lesion or focal pathologic process. Soft tissues and spinal canal: No prevertebral edema. No visible canal hematoma. Disc levels: Marked multilevel cervical degenerative disc disease, most  prominent at C5-6. Advanced bilateral facet arthropathy. Moderate bilateral degenerative foraminal stenosis at C3-4. Severe degenerative foraminal stenosis on the right at C4-5. Mild to moderate degenerative foraminal stenosis on the left at C5-6 and C6-7. Upper chest: No acute abnormality. Other: Visualized mastoid air cells appear clear. No discrete thyroid nodules. No pathologically enlarged cervical nodes. IMPRESSION: 1. Loss of gray-white differentiation in the medial left occipital lobe with associated mild sulcal effacement, suspicious for acute/subacute nonhemorrhagic infarct. 2. No acute intracranial hemorrhage. 3. Generalized cerebral volume loss and prominent chronic small vessel ischemic changes in the cerebral white matter. 4. No cervical spine fracture or facet subluxation. 5. Advanced degenerative changes throughout the cervical spine as detailed. Critical Value/emergent results were called by telephone at the time of interpretation on 03/05/2019 at 6:52 pm to Dr. Alvira MondayERIN SCHLOSSMAN , who verbally acknowledged these results. Electronically Signed   By: Delbert PhenixJason A Poff M.D.   On: 03/05/2019 18:55   Dg Chest Portable 1 View  Result Date: 03/05/2019 CLINICAL DATA:  Fever EXAM: PORTABLE CHEST 1 VIEW COMPARISON:  10/11/2017 chest radiograph. FINDINGS: Stable cardiomediastinal silhouette with mild cardiomegaly. No pneumothorax. No pleural effusion. Suggestion of a mild patchy lingular opacity. No pulmonary edema. IMPRESSION: Suggestion of a mild patchy lingular opacity, cannot exclude pneumonia. Chest radiograph follow-up advised. Electronically Signed   By: Delbert PhenixJason A Poff M.D.   On: 03/05/2019 17:47    EKG: Pending at this time.  Assessment/Plan Principal Problem:   Acute CVA (cerebrovascular accident) [facility](HCC) Active Problems:   Atrial fibrillation with rapid ventricular response (HCC)   CAP (community acquired pneumonia)   UTI (urinary tract infection)   Hypoglycemia   Subacute ischemic stroke,  likely cardioembolic Patient is here for evaluation of altered mental status.  On exam, has right homonymous hemianopsia.  Head CT with findings suspicious for acute/subacute nonhemorrhagic infarct in the medial left occipital lobe.  Noted to be in A. fib with RVR.  -Neurology consulted, appreciate recommendations -Telemetry monitoring -MRI of the brain without contrast -MRA of neck without contrast -Carotid Dopplers -2D echocardiogram -Hemoglobin A1c, fasting lipid panel -Aspirin 325 p.o. now and daily -Continue home Lipitor 40 mg daily -Frequent neurochecks -PT, OT, speech therapy. -N.p.o. until cleared by bedside swallow evaluation or formal speech evaluation -Currently in A. fib with RVR and needs a rate control agent.  Goal blood pressure 100-140.  Do not let blood pressure drop below 100.  Start heparin per stroke protocol (low level and no bolus) for anticoagulation.  Discussed with neurology.  Possible community-acquired pneumonia Afebrile and  no leukocytosis.  No lactic acidosis.  Not hypoxic.  No signs of sepsis. Chest x-ray with mild patchy lingular opacity, cannot exclude pneumonia.  -Continue ceftriaxone and azithromycin -Check procalcitonin level -Supplemental oxygen if needed  Possible UTI  UA with negative nitrite, small amount of leukocytes, 0-5 WBCs, and many bacteria.  Afebrile and no leukocytosis.  No lactic acidosis.  No signs of sepsis. -Ceftriaxone -Urine culture pending  A. fib with RVR Rate up to 130s. CHA2DS2VASc now 6.  Not on anticoagulation at home.  Precipitating factor likely not being able to take her home Cardizem versus underlying infection (pneumonia +/- UTI). -Monitor in the stepdown unit -Cardizem infusion -Heparin infusion -Likely a poor candidate for anticoagulation given advanced age and frequent falls.  Discuss with family and stroke team in the morning.  Hypoglycemia Likely secondary to decreased p.o. intake.  Hypoglycemic in the ED with  CBG as low as 63.  Received D50. -D5, normal saline infusion -CBG checks every 2 hours  Altered mental status, frequent falls Likely secondary to stroke, underlying infection (pneumonia +/- UTI), and hypoglycemia. -Management as above, PT evaluation -Fall precautions -Continue to monitor for mental status changes  Normocytic anemia Hemoglobin 10.5, was 13.1 a year ago.  No signs of active bleeding. -Anemia panel  AKI BUN 28 creatinine 1.4.  Creatinine was 0.8 a year ago.  Likely prerenal secondary to dehydration. -IV fluid hydration -Avoid nephrotoxic agents -Repeat BMP in a.m. -Monitor urine output  Normal anion gap metabolic acidosis Bicarb 17, anion gap 12.  Possibly related to AKI. -IV fluid hydration -Repeat BMP in a.m.  Unable to safely order home medications at this time as pharmacy medication reconciliation is pending.  DVT prophylaxis: Heparin Code Status: Full code.  Please discuss with family in the morning. Family Communication: No family available at this time. Disposition Plan: Anticipate discharge after clinical improvement. Consults called: Neurology Admission status: It is my clinical opinion that admission to INPATIENT is reasonable and necessary in this 85 y.o. female  presenting with acute/subacute ischemic stroke seen on CT.  Will need stroke work-up.  Also has possible pneumonia versus UTI.  Given the aforementioned, the predictability of an adverse outcome is felt to be significant. I expect that the patient will require at least 2 midnights in the hospital to treat this condition.   The medical decision making on this patient was of high complexity and the patient is at high risk for clinical deterioration, therefore this is a level 3 visit.  John Giovanni MD [facility] Pager [phone]  If 7PM-7AM, please contact night-coverage [email or web address] Password [facility]  03/05/2019, 8:41 PM

## 2019-03-05 NOTE — ED Notes (Addendum)
[place] daughter updated.

## 2019-03-05 NOTE — ED Notes (Signed)
Pt given 8oz coke and [place] sandwich.

## 2019-03-05 NOTE — ED Notes (Signed)
Neurology at bedside.

## 2019-03-05 NOTE — ED Notes (Signed)
Portable xray at bedside.

## 2019-03-05 NOTE — ED Notes (Signed)
Report given to 3W RN. All questions answered.  

## 2019-03-06 ENCOUNTER — Inpatient Hospital Stay (HOSPITAL_COMMUNITY): Payer: Medicare HMO

## 2019-03-06 DIAGNOSIS — I361 Nonrheumatic tricuspid (valve) insufficiency: Secondary | ICD-10-CM

## 2019-03-06 DIAGNOSIS — I351 Nonrheumatic aortic (valve) insufficiency: Secondary | ICD-10-CM

## 2019-03-06 LAB — GLUCOSE, CAPILLARY
Glucose-Capillary: 86 mg/dL (ref 70–99)
Glucose-Capillary: 81 mg/dL (ref 70–99)
Glucose-Capillary: 104 mg/dL — ABNORMAL HIGH (ref 70–99)
Glucose-Capillary: 94 mg/dL (ref 70–99)
Glucose-Capillary: 113 mg/dL — ABNORMAL HIGH (ref 70–99)

## 2019-03-06 LAB — BASIC METABOLIC PANEL
Anion gap: 9 (ref 5–15)
BUN: 19 mg/dL (ref 8–23)
CO2: 18 mmol/L — ABNORMAL LOW (ref 22–32)
Calcium: 8.1 mg/dL — ABNORMAL LOW (ref 8.9–10.3)
Chloride: 108 mmol/L (ref 98–111)
Creatinine, Ser: 0.97 mg/dL (ref 0.44–1.00)
GFR calc Af Amer: >60 mL/min (ref >60)
GFR calc non Af Amer: 53 mL/min — ABNORMAL LOW (ref >60)
Glucose, Bld: 87 mg/dL (ref 70–99)
Potassium: 3.1 mmol/L — ABNORMAL LOW (ref 3.5–5.1)
Sodium: 135 mmol/L (ref 135–145)

## 2019-03-06 LAB — IRON AND TIBC
Iron: 15 ug/dL — ABNORMAL LOW (ref 28–170)
Saturation Ratios: 5 % — ABNORMAL LOW (ref 10.4–31.8)
TIBC: 321 ug/dL (ref 250–450)
UIBC: 306 ug/dL

## 2019-03-06 LAB — CBC
HCT: 28.3 % — ABNORMAL LOW (ref 36.0–46.0)
Hemoglobin: 9.3 g/dL — ABNORMAL LOW (ref 12.0–15.0)
MCH: 26.6 pg (ref 26.0–34.0)
MCHC: 32.9 g/dL (ref 30.0–36.0)
MCV: 80.9 fL (ref 80.0–100.0)
Platelets: 209 10*3/uL (ref 150–400)
RBC: 3.5 MIL/uL — ABNORMAL LOW (ref 3.87–5.11)
RDW: 24.2 % — ABNORMAL HIGH (ref 11.5–15.5)
WBC: 8.6 10*3/uL (ref 4.0–10.5)
nRBC: 0 % (ref 0.0–0.2)

## 2019-03-06 LAB — RETICULOCYTES
Immature Retic Fract: 21.7 % — ABNORMAL HIGH (ref 2.3–15.9)
RBC.: 3.5 MIL/uL — ABNORMAL LOW (ref 3.87–5.11)
Retic Count, Absolute: 39.2 10*3/uL (ref 19.0–186.0)
Retic Ct Pct: 1.1 % (ref 0.4–3.1)

## 2019-03-06 LAB — HEPARIN LEVEL (UNFRACTIONATED)
Heparin Unfractionated: <0.1 IU/mL — ABNORMAL LOW (ref 0.30–0.70)
Heparin Unfractionated: 0.21 IU/mL — ABNORMAL LOW (ref 0.30–0.70)

## 2019-03-06 LAB — FERRITIN: Ferritin: 33 ng/mL (ref 11–307)

## 2019-03-06 LAB — ECHOCARDIOGRAM COMPLETE
Height: 62 in
Weight: 1680 oz

## 2019-03-06 LAB — VITAMIN B12: Vitamin B-12: 471 pg/mL (ref 180–914)

## 2019-03-06 LAB — LIPID PANEL
Cholesterol: 105 mg/dL (ref 0–200)
HDL: 41 mg/dL (ref >40)
LDL Cholesterol: 49 mg/dL (ref 0–99)
Total CHOL/HDL Ratio: 2.6 RATIO
Triglycerides: 74 mg/dL (ref <150)
VLDL: 15 mg/dL (ref 0–40)

## 2019-03-06 LAB — HEMOGLOBIN A1C
Hgb A1c MFr Bld: 5.2 % (ref 4.8–5.6)
Mean Plasma Glucose: 102.54 mg/dL

## 2019-03-06 LAB — FOLATE: Folate: 9.8 ng/mL (ref >5.9)

## 2019-03-06 LAB — MAGNESIUM: Magnesium: 1.6 mg/dL — ABNORMAL LOW (ref 1.7–2.4)

## 2019-03-06 MED ORDER — MAGNESIUM SULFATE 2 GM/50ML IV SOLN
2.0000 g | Freq: Once | INTRAVENOUS | Status: AC
  Administered 2019-03-06: 2 g via INTRAVENOUS
  Filled 2019-03-06: qty 50

## 2019-03-06 MED ORDER — DILTIAZEM HCL 30 MG PO TABS
90.0000 mg | ORAL_TABLET | Freq: Four times a day (QID) | ORAL | Status: DC
  Administered 2019-03-06 – 2019-03-07 (×5): 90 mg via ORAL
  Filled 2019-03-06 (×4): qty 3

## 2019-03-06 MED ORDER — POTASSIUM CHLORIDE CRYS ER 20 MEQ PO TBCR
40.0000 meq | EXTENDED_RELEASE_TABLET | Freq: Two times a day (BID) | ORAL | Status: AC
  Administered 2019-03-06 (×2): 40 meq via ORAL
  Filled 2019-03-06 (×2): qty 2

## 2019-03-06 NOTE — Progress Notes (Signed)
PROGRESS NOTE    Sherry [name]  ZOX:[id number] DOB: 1928/12/04 DOA: 03/05/2019 PCP: Darrow BussingKoirala, Dibas, MD    Brief Narrative:  85 y.o. female with medical history significant of COPD, hypertension, hyperlipidemia, CAD status post stent, A. fib not on anticoagulation presenting to the hospital via EMS for evaluation of worsening falls.  Daughter reported that patient has been having a fall almost every day for the past 2 weeks.  She had a fall 2 weeks ago and hit her head as she fell from a chair and bruised her head and eye.  She has continued to have falls every day.  She has been having worsening memory for the past few months to years.  She has been using a walker to ambulate after hip replacement surgery 5 years ago.  She requires some assistance with cooking but is able to bathe herself and take care of some of the other ADLs with minimal assistance at home.  At baseline, she is able to answer orientation questions correctly only at times.  She has had UTIs which have been treated.  Patient knows her name and that she is at a hospital.  Appears confused and not able to provide much history.  She has no complaints.  Denies any chest pain, shortness of breath, abdominal pain, or dysuria.  No other complaints.  ED Course: A. fib with RVR with rate up to 130s.  Not hypotensive.  Not febrile.  Not hypoxic.  No leukocytosis.  Lactic acid normal.  Hemoglobin 10.5, was 13.1 a year ago.  CBG 69 upon arrival to the ED.  Bicarb 17.  Anion gap 12.  BUN 28 creatinine 1.4.  Creatinine was 0.8 a year ago.  No significant LFT abnormalities.  CK not significantly elevated.  UA with negative nitrite, small amount of leukocytes, 0-5 WBCs, and many bacteria. Head CT with findings suspicious for acute/subacute nonhemorrhagic infarct in the medial left occipital lobe.  CT C-spine negative for acute finding. Patient received ceftriaxone, azithromycin, and 1 L normal saline bolus in the ED. Dr. Wilford CornerArora from neurology has been  consulted  Assessment & Plan:   Principal Problem:   Acute CVA (cerebrovascular accident) [facility](HCC) Active Problems:   Atrial fibrillation with rapid ventricular response (HCC)   CAP (community acquired pneumonia)   UTI (urinary tract infection)   Hypoglycemia  Subacute ischemic stroke, likely cardioembolic -Head CT with findings suspicious for acute/subacute nonhemorrhagic infarct in the medial left occipital lobe.  Noted to be in A. fib with RVR.  -Neurology consulted, appreciate recommendations -MRI of the brain and MRA neck reviewed. Findings of acute/subacute confluent L PCA territory infarct with petechial hemorrhage, otherwise neg for large vessel occlusion -2D echocardiogram performed and reviewed, normal LVEF -Hemoglobin A1c of 5.2 -LDL 49 -Continued on Aspirin 325 p.o. now and daily -Continue home Lipitor 40 mg daily -PT, OT, speech therapy. -now on heparin given below afib  Possible community-acquired pneumonia Afebrile and no leukocytosis.  No lactic acidosis.  Not hypoxic.  No signs of sepsis. Chest x-ray with mild patchy lingular opacity, cannot exclude pneumonia.  -Continue ceftriaxone and azithromycin as tolerated -Afebrile  Possible UTI  UA with negative nitrite, small amount of leukocytes, 0-5 WBCs, and many bacteria.  Afebrile and no leukocytosis.  No lactic acidosis.  No signs of sepsis. -Ceftriaxone started -Urine culture remains pending. Blood cx thus far neg  A. fib with RVR Rate up to 130s. CHA2DS2VASc now 6.  Not on anticoagulation at home.  Precipitating factor likely not  being able to take her home Cardizem versus underlying infection (pneumonia +/- UTI). -HR improved with cardizem gtt overnight. Will transition to PO cardizem -Heparin infusion for now. Pending PT/OT eval to determine fall risk, however given markedly high risk for future stroke, pt would likely benefit from anticoagulation. Pending PT/OT eval  Hypoglycemia Likely secondary to  decreased p.o. intake.  Hypoglycemic in the ED with CBG as low as 63.  Received D50. -Currently on d5 fluids. Anticipate d/c pending improvement in random glucose  Altered mental status, frequent falls -Fall precautions -Continue to monitor for mental status changes -Pending PT/OT eval  Normocytic anemia Hemoglobin 10.5, was 13.1 a year ago.  No signs of active bleeding. -Hemodynamically stable at this time  AKI BUN 28 creatinine 1.4.  Creatinine was 0.8 a year ago.  Likely prerenal secondary to dehydration. -continued on IV fluid hydration -Avoid nephrotoxic agents -recheck bmet in AM  Normal anion gap metabolic acidosis Bicarb 17, anion gap 12.  Possibly related to AKI. -continue on IV fluid hydration per above -Recheck BMP in a.m.  DVT prophylaxis: Heparin gtt Code Status: Full Family Communication: Pt in room, family not at bedside Disposition Plan: Uncertain at this time  Consultants:   Neurology  Procedures:     Antimicrobials: Anti-infectives (From admission, onward)   Start     Dose/Rate Route Frequency Ordered Stop   03/06/19 1900  cefTRIAXone (ROCEPHIN) 1 g in sodium chloride 0.9 % 100 mL IVPB     1 g 200 mL/hr over 30 Minutes Intravenous Every 24 hours 03/05/19 1944     03/06/19 1900  azithromycin (ZITHROMAX) 500 mg in sodium chloride 0.9 % 250 mL IVPB     500 mg 250 mL/hr over 60 Minutes Intravenous Every 24 hours 03/05/19 1944     03/05/19 1945  cefTRIAXone (ROCEPHIN) 1 g in sodium chloride 0.9 % 100 mL IVPB  Status:  Discontinued     1 g 200 mL/hr over 30 Minutes Intravenous  Once 03/05/19 1944 03/05/19 1947   03/05/19 1800  cefTRIAXone (ROCEPHIN) 1 g in sodium chloride 0.9 % 100 mL IVPB     1 g 200 mL/hr over 30 Minutes Intravenous  Once 03/05/19 1759 03/05/19 2005   03/05/19 1800  azithromycin (ZITHROMAX) 500 mg in sodium chloride 0.9 % 250 mL IVPB     500 mg 250 mL/hr over 60 Minutes Intravenous  Once 03/05/19 1759 03/05/19 2005        Subjective: Without complaints at this time  Objective: Vitals:   03/06/19 0308 03/06/19 0556 03/06/19 0732 03/06/19 1230  BP: 116/79 113/75 97/68 (!) 107/59  Pulse: (!) 107 98 (!) 112 73  Resp: Temp: 98 F (36.7 C) 98.6 F (37 C) 98.1 F (36.7 C) 98.2 F (36.8 C)  TempSrc: Oral Oral Oral Oral  SpO2:  93% 93% 96%  Weight:      Height:        Intake/Output Summary (Last 24 hours) at 03/06/2019 1533 Last data filed at 03/06/2019 0600 Gross per 24 hour  Intake 834.03 ml  Output 400 ml  Net 434.03 ml   Filed Weights   03/05/19 2000  Weight: 47.6 kg    Examination:  General exam: Appears calm and comfortable  Respiratory system: Clear to auscultation. Respiratory effort normal. Cardiovascular system: S1 & S2 heard, irregularly irregular Gastrointestinal system: Abdomen is nondistended, soft and nontender. No organomegaly or masses felt. Normal bowel sounds heard. Central nervous system: Alert and oriented.  No focal neurological deficits. Extremities: Symmetric 5 x 5 power. Skin: No rashes, lesions Psychiatry: Judgement and insight appear normal. Mood & affect appropriate.   Data Reviewed: I have personally reviewed following labs and imaging studies  CBC: Recent Labs  Lab 03/05/19 1720 03/06/19 0529  WBC 8.1 8.6  NEUTROABS 6.7  --   HGB 10.5* 9.3*  HCT 33.6* 28.3*  MCV 84.4 80.9  PLT 228 209   Basic Metabolic Panel: Recent Labs  Lab 03/05/19 1720 03/06/19 0529  NA 134* 135  K 3.5 3.1*  CL 105 108  CO2 17* 18*  GLUCOSE 69* 87  BUN 28* 19  CREATININE 1.45* 0.97  CALCIUM 8.7* 8.1*  MG  --  1.6*   GFR: Estimated Creatinine Clearance: 30.7 mL/min (by C-G formula based on SCr of 0.97 mg/dL). Liver Function Tests: Recent Labs  Lab 03/05/19 1720  AST 43*  ALT 16  ALKPHOS 58  BILITOT 0.9  PROT 6.0*  ALBUMIN 3.2*   No results for input(s): LIPASE, AMYLASE in the last 168 hours. No results for input(s): AMMONIA in the last 168  hours. Coagulation Profile: Recent Labs  Lab 03/05/19 1720  INR 1.2   Cardiac Enzymes: Recent Labs  Lab 03/05/19 1720  CKTOTAL 268*   BNP (last 3 results) No results for input(s): PROBNP in the last 8760 hours. HbA1C: Recent Labs    03/06/19 0529  HGBA1C 5.2   CBG: Recent Labs  Lab 03/05/19 1942 03/05/19 2110 03/06/19 0358 03/06/19 0822 03/06/19 1239  GLUCAP 63* 179* 86 81 104*   Lipid Profile: Recent Labs    03/06/19 0529  CHOL 105  HDL 41  LDLCALC 49  TRIG 74  CHOLHDL 2.6   Thyroid Function Tests: No results for input(s): TSH, T4TOTAL, FREET4, T3FREE, THYROIDAB in the last 72 hours. Anemia Panel: Recent Labs    03/06/19 0529  VITAMINB12 471  FOLATE 9.8  FERRITIN 33  TIBC 321  IRON 15*  RETICCTPCT 1.1   Sepsis Labs: Recent Labs  Lab 03/05/19 1720  PROCALCITON 0.35  LATICACIDVEN 1.2    Recent Results (from the past 240 hour(s))  Culture, blood (Routine x 2)     Status: None (Preliminary result)   Collection Time: 03/05/19  5:15 PM   Specimen: BLOOD  Result Value Ref Range Status   Specimen Description BLOOD RIGHT ANTECUBITAL  Final   Special Requests   Final    BOTTLES DRAWN AEROBIC AND ANAEROBIC Blood Culture results may not be optimal due to an inadequate volume of blood received in culture bottles   Culture   Final    NO GROWTH < 24 HOURS Performed at [facility], [address] [place], [place] [address]    Report Status PENDING  Incomplete  Culture, blood (Routine x 2)     Status: None (Preliminary result)   Collection Time: 03/05/19  5:20 PM   Specimen: BLOOD LEFT FOREARM  Result Value Ref Range Status   Specimen Description BLOOD LEFT FOREARM  Final   Special Requests   Final    BOTTLES DRAWN AEROBIC AND ANAEROBIC Blood Culture results may not be optimal due to an inadequate volume of blood received in culture bottles   Culture   Final    NO GROWTH < 24 HOURS Performed at [facility], [address]  [place], [place] [address]    Report Status PENDING  Incomplete  Urine culture     Status: None (Preliminary result)   Collection  Time: 03/05/19  5:20 PM   Specimen: Urine, Random  Result Value Ref Range Status   Specimen Description URINE, RANDOM  Final   Special Requests NONE  Final   Culture   Final    CULTURE REINCUBATED FOR BETTER GROWTH Performed at [facility], [address] [place], [place] [address]    Report Status PENDING  Incomplete  SARS Coronavirus 2 (CEPHEID - Performed in [facility]), Hosp Order     Status: None   Collection Time: 03/05/19  8:14 PM   Specimen: Nasopharyngeal Swab  Result Value Ref Range Status   SARS Coronavirus 2 NEGATIVE NEGATIVE Final    Comment: (NOTE) If result is NEGATIVE SARS-CoV-2 target nucleic acids are NOT DETECTED. The SARS-CoV-2 RNA is generally detectable in upper and lower  respiratory specimens during the acute phase of infection. The lowest  concentration of SARS-CoV-2 viral copies this assay can detect is 250  copies / mL. A negative result does not preclude SARS-CoV-2 infection  and should not be used as the sole basis for treatment or other  patient management decisions.  A negative result may occur with  improper specimen collection / handling, submission of specimen other  than nasopharyngeal swab, presence of viral mutation(s) within the  areas targeted by this assay, and inadequate number of viral copies  (<250 copies / mL). A negative result must be combined with clinical  observations, patient history, and epidemiological information. If result is POSITIVE SARS-CoV-2 target nucleic acids are DETECTED. The SARS-CoV-2 RNA is generally detectable in upper and lower  respiratory specimens dur ing the acute phase of infection.  Positive  results are indicative of active infection with SARS-CoV-2.  Clinical  correlation with patient history and other diagnostic information is  necessary to determine patient  infection status.  Positive results do  not rule out bacterial infection or co-infection with other viruses. If result is PRESUMPTIVE POSTIVE SARS-CoV-2 nucleic acids MAY BE PRESENT.   A presumptive positive result was obtained on the submitted specimen  and confirmed on repeat testing.  While 2019 novel coronavirus  (SARS-CoV-2) nucleic acids may be present in the submitted sample  additional confirmatory testing may be necessary for epidemiological  and / or clinical management purposes  to differentiate between  SARS-CoV-2 and other Sarbecovirus currently known to infect humans.  If clinically indicated additional testing with an alternate test  methodology [phone]) is advised. The SARS-CoV-2 RNA is generally  detectable in upper and lower respiratory sp ecimens during the acute  phase of infection. The expected result is Negative. Fact Sheet for Patients:  StrictlyIdeas.no Fact Sheet for Healthcare Providers: BankingDealers.co.za This test is not yet approved or cleared by the [place] FDA and has been authorized for detection and/or diagnosis of SARS-CoV-2 by FDA under an Emergency Use Authorization (EUA).  This EUA will remain in effect (meaning this test can be used) for the duration of the COVID-19 declaration under Section 564(b)(1) of the Act, 21 U.S.C. section 360bbb-3(b)(1), unless the authorization is terminated or revoked sooner. Performed at [facility], [place] [address], [place], [place] [address]      Radiology Studies: Ct Head Wo Contrast  Result Date: 03/05/2019 CLINICAL DATA:  Frequent falls.  Altered mental status. EXAM: CT HEAD WITHOUT CONTRAST CT CERVICAL SPINE WITHOUT CONTRAST TECHNIQUE: Multidetector CT imaging of the head and cervical spine was performed following the standard protocol without intravenous contrast. Multiplanar CT image reconstructions of the cervical spine were also  generated.  COMPARISON:  None. FINDINGS: CT HEAD FINDINGS Brain: No evidence of parenchymal hemorrhage or extra-axial fluid collection. No mass lesion, mass effect, or midline shift. Loss of gray-white differentiation in medial left occipital lobe with associated mild sulcal effacement. Generalized cerebral volume loss. Nonspecific prominent subcortical and periventricular white matter hypodensity, most in keeping with chronic small vessel ischemic change. Cerebral ventricle sizes are concordant with the degree of cerebral volume loss. Vascular: No acute abnormality. Skull: No evidence of calvarial fracture. Sinuses/Orbits: The visualized paranasal sinuses are essentially clear. Other:  The mastoid air cells are unopacified. CT CERVICAL SPINE FINDINGS Alignment: Mild straightening of the cervical spine. No facet subluxation. Dens is well positioned between the lateral masses of C1. Minimal 2 mm anterolisthesis at C3-4, C4-5 and C7-T1. Skull base and vertebrae: No acute fracture. No primary bone lesion or focal pathologic process. Soft tissues and spinal canal: No prevertebral edema. No visible canal hematoma. Disc levels: Marked multilevel cervical degenerative disc disease, most prominent at C5-6. Advanced bilateral facet arthropathy. Moderate bilateral degenerative foraminal stenosis at C3-4. Severe degenerative foraminal stenosis on the right at C4-5. Mild to moderate degenerative foraminal stenosis on the left at C5-6 and C6-7. Upper chest: No acute abnormality. Other: Visualized mastoid air cells appear clear. No discrete thyroid nodules. No pathologically enlarged cervical nodes. IMPRESSION: 1. Loss of gray-white differentiation in the medial left occipital lobe with associated mild sulcal effacement, suspicious for acute/subacute nonhemorrhagic infarct. 2. No acute intracranial hemorrhage. 3. Generalized cerebral volume loss and prominent chronic small vessel ischemic changes in the cerebral white matter. 4. No cervical  spine fracture or facet subluxation. 5. Advanced degenerative changes throughout the cervical spine as detailed. Critical Value/emergent results were called by telephone at the time of interpretation on 03/05/2019 at 6:52 pm to Dr. Alvira Monday , who verbally acknowledged these results. Electronically Signed   By: Delbert Phenix M.D.   On: 03/05/2019 18:55   Ct Cervical Spine Wo Contrast  Result Date: 03/05/2019 CLINICAL DATA:  Frequent falls.  Altered mental status. EXAM: CT HEAD WITHOUT CONTRAST CT CERVICAL SPINE WITHOUT CONTRAST TECHNIQUE: Multidetector CT imaging of the head and cervical spine was performed following the standard protocol without intravenous contrast. Multiplanar CT image reconstructions of the cervical spine were also generated. COMPARISON:  None. FINDINGS: CT HEAD FINDINGS Brain: No evidence of parenchymal hemorrhage or extra-axial fluid collection. No mass lesion, mass effect, or midline shift. Loss of gray-white differentiation in medial left occipital lobe with associated mild sulcal effacement. Generalized cerebral volume loss. Nonspecific prominent subcortical and periventricular white matter hypodensity, most in keeping with chronic small vessel ischemic change. Cerebral ventricle sizes are concordant with the degree of cerebral volume loss. Vascular: No acute abnormality. Skull: No evidence of calvarial fracture. Sinuses/Orbits: The visualized paranasal sinuses are essentially clear. Other:  The mastoid air cells are unopacified. CT CERVICAL SPINE FINDINGS Alignment: Mild straightening of the cervical spine. No facet subluxation. Dens is well positioned between the lateral masses of C1. Minimal 2 mm anterolisthesis at C3-4, C4-5 and C7-T1. Skull base and vertebrae: No acute fracture. No primary bone lesion or focal pathologic process. Soft tissues and spinal canal: No prevertebral edema. No visible canal hematoma. Disc levels: Marked multilevel cervical degenerative disc disease, most  prominent at C5-6. Advanced bilateral facet arthropathy. Moderate bilateral degenerative foraminal stenosis at C3-4. Severe degenerative foraminal stenosis on the right at C4-5. Mild to moderate degenerative foraminal stenosis on the left at C5-6 and C6-7. Upper chest: No acute abnormality. Other:  Visualized mastoid air cells appear clear. No discrete thyroid nodules. No pathologically enlarged cervical nodes. IMPRESSION: 1. Loss of gray-white differentiation in the medial left occipital lobe with associated mild sulcal effacement, suspicious for acute/subacute nonhemorrhagic infarct. 2. No acute intracranial hemorrhage. 3. Generalized cerebral volume loss and prominent chronic small vessel ischemic changes in the cerebral white matter. 4. No cervical spine fracture or facet subluxation. 5. Advanced degenerative changes throughout the cervical spine as detailed. Critical Value/emergent results were called by telephone at the time of interpretation on 03/05/2019 at 6:52 pm to Dr. Alvira MondayERIN SCHLOSSMAN , who verbally acknowledged these results. Electronically Signed   By: Delbert PhenixJason A Poff M.D.   On: 03/05/2019 18:55   Dg Chest Portable 1 View  Result Date: 03/05/2019 CLINICAL DATA:  Fever EXAM: PORTABLE CHEST 1 VIEW COMPARISON:  10/11/2017 chest radiograph. FINDINGS: Stable cardiomediastinal silhouette with mild cardiomegaly. No pneumothorax. No pleural effusion. Suggestion of a mild patchy lingular opacity. No pulmonary edema. IMPRESSION: Suggestion of a mild patchy lingular opacity, cannot exclude pneumonia. Chest radiograph follow-up advised. Electronically Signed   By: Delbert PhenixJason A Poff M.D.   On: 03/05/2019 17:47    Scheduled Meds:  aspirin  300 mg Rectal Daily   Or   aspirin  325 mg Oral Daily   atorvastatin  40 mg Oral q1800   diltiazem  90 mg Oral Q6H   potassium chloride  40 mEq Oral BID   sodium chloride flush  3 mL Intravenous Once   Continuous Infusions:  azithromycin     cefTRIAXone (ROCEPHIN)  IV      dextrose 5 % and 0.9% NaCl 100 mL/hr at 03/05/19 2018   diltiazem (CARDIZEM) infusion Stopped (03/06/19 1412)   heparin 800 Units/hr (03/06/19 0738)     LOS: 1 day   Rickey BarbaraStephen Bjorn Hallas, MD [facility] Pager On Amion  If 7PM-7AM, please contact night-coverage 03/06/2019, 3:33 PM

## 2019-03-06 NOTE — Progress Notes (Signed)
STROKE TEAM PROGRESS NOTE   HISTORY OF PRESENT ILLNESS (per Dr Wilford CornerArora) Sherry [name] is a 85 y.o. female past medical history of hypertension, hyperlipidemia, COPD, atrial fibrillation only on Plavix, presented to the emergency room for evaluation of worsening falls. The daughter reports she has been having a fall almost every day for the past 2 weeks.  She had a fall 2 weeks ago when she hit her head as she fell from a chair and bruised her head and eye.  Since then she has been having more falls every day and that has been concerning. She has been having worsening memory over the past few months to years.  She has over the past 5 years used a walker to walk after her hip replacement surgery 5 years ago.  She requires some assistance with cooking but is able to bathe herself and take care of some of the other ADLs with minimal assistance at home. At baseline, her mentation is off and on where she is barely able to oriented to time or space but at times might be able to answer some orientation questions correctly. She is also had some trouble with UTIs and that have been treated. Noted to be in A. fib with RVR in the ER. Denies tingling numbness weakness headache. Reports difficulty with urination and burning urination. Denies shortness of breath cough fevers.  LKW: 2 weeks ago tpa given?: no, outside the window Premorbid modified Rankin scale (mRS): 3-4   SUBJECTIVE (INTERVAL HISTORY) Her  RN is at the bedside.  I have personally reviewed history of presenting illness with the patient.  I suspect she has dementia at baseline with history of fluctuating mental status and recurrent UTIs.  She presented with increasing falls and CT scan does show subacute left PCA infarct.  She has atrial fibrillation but has not been on anticoagulation due to fall risk and dementia I suppose.    OBJECTIVE Vitals:   03/06/19 0139 03/06/19 0308 03/06/19 0556 03/06/19 0732  BP: (!) 153/77 116/79 113/75 97/68   Pulse: 96 (!) 107 98 (!) 112  Resp: 18 18 18 16   Temp: 98.2 F (36.8 C) 98 F (36.7 C) 98.6 F (37 C) 98.1 F (36.7 C)  TempSrc: Oral Oral Oral Oral  SpO2: 95%  93% 93%  Weight:      Height:        CBC:  Recent Labs  Lab 03/05/19 1720 03/06/19 0529  WBC 8.1 8.6  NEUTROABS 6.7  --   HGB 10.5* 9.3*  HCT 33.6* 28.3*  MCV 84.4 80.9  PLT 228 209    Basic Metabolic Panel:  Recent Labs  Lab 03/05/19 1720 03/06/19 0529  NA 134* 135  K 3.5 3.1*  CL 105 108  CO2 17* 18*  GLUCOSE 69* 87  BUN 28* 19  CREATININE 1.45* 0.97  CALCIUM 8.7* 8.1*    Lipid Panel:     Component Value Date/Time   CHOL 105 03/06/2019 0529   TRIG 74 03/06/2019 0529   HDL 41 03/06/2019 0529   CHOLHDL 2.6 03/06/2019 0529   VLDL 15 03/06/2019 0529   LDLCALC 49 03/06/2019 0529   HgbA1c:  Lab Results  Component Value Date   HGBA1C 5.2 03/06/2019   Urine Drug Screen: No results found for: LABOPIA, COCAINSCRNUR, LABBENZ, AMPHETMU, THCU, LABBARB  Alcohol Level No results found for: ETH  IMAGING  Ct Head Wo Contrast 03/05/2019 IMPRESSION:  1. Loss of gray-white differentiation in the medial left occipital lobe with  associated mild sulcal effacement, suspicious for acute/subacute nonhemorrhagic infarct.  2. No acute intracranial hemorrhage.  3. Generalized cerebral volume loss and prominent chronic small vessel ischemic changes in the cerebral white matter.   Ct Cervical Spine Wo Contrast 03/05/2019 1. No cervical spine fracture or facet subluxation.  2. Advanced degenerative changes throughout the cervical spine as detailed.   Dg Chest Portable 1 View 03/05/2019 IMPRESSION:  Suggestion of a mild patchy lingular opacity, cannot exclude pneumonia. Chest radiograph follow-up advised.    MRI Brain WO Contrast - pending  MRA Head and Neck - pending   Transthoracic Echocardiogram  03/06/2019 IMPRESSIONS  1. The left ventricle has normal systolic function with an ejection fraction of  60-65%. The cavity size was normal. There is mildly increased left ventricular wall thickness. Left ventricular diastolic function could not be evaluated. Elevated mean left atrial pressure.  2. The right ventricle has normal systolic function. The cavity was normal. Right ventricular systolic pressure is mildly elevated.  3. Left atrial size was severely dilated.  4. Right atrial size was severely dilated.  5. The mitral valve is abnormal. Mild thickening of the mitral valve leaflet. There is severe mitral annular calcification present.  6. The tricuspid valve is grossly normal. Tricuspid valve regurgitation is moderate-severe.  7. The aortic valve is tricuspid. Mild thickening of the aortic valve. Aortic valve regurgitation is mild by color flow Doppler. No stenosis of the aortic valve.  8. The inferior vena cava was dilated in size with <50% respiratory variability.  9. Mild apical hypokinesis with overall preserved LV function; mild LVH; severe biatrial enlargement; milld AI; moderate to severe TR; RVSP mildly elevated.   EKG - atrial fibrillation - rapid ventricular response 119 BPM (See cardiology reading for complete details)    PHYSICAL EXAM Blood pressure 97/68, pulse (!) 112, temperature 98.1 F (36.7 C), temperature source Oral, resp. rate 16, height 5\' 2"  (1.575 m), weight 47.6 kg, SpO2 93 %. Pleasant elderly Caucasian lady not in distress. . Afebrile. Head is nontraumatic. Neck is supple without bruit.    Cardiac exam no murmur or gallop. Lungs are clear to auscultation. Distal pulses are well felt.  Neurological Exam :  Awake alert oriented to place and person.  Diminished attention, registration and recall.  Speech is fluent without aphasia or apraxia or dysarthria.  Extraocular movements are full range without nystagmus.  Dense right homonymous hemianopsia.  Face is symmetric without weakness tongue midline.  Motor system exam symmetric upper and lower extremity strength no focal  weakness.  Moves all 4 extremities well against gravity.  Diminished right hemibody touch pinprick sensation.  Mild sensory inattention on the right.  Plantars downgoing.  Gait not tested.     ASSESSMENT/PLAN Sherry [name] is a 85 y.o. female with history of hypertension, frequent UTI's, CAD with stent placement, tobacco use, baseline mental status variability, hyperlipidemia, COPD, and atrial fibrillation (not anticoagulated) presenting with frequent falls. She did not receive IV t-PA due to late presentation.    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   MRI head - pending  MRA H&N - pending  CTA H&N - not performed  Carotid Doppler - not performed. MRA H&N - pending  2D Echo  - EF 60 - 65%. No cardiac  source of emboli identified. Moderate to severe TR.  Severe biatrial enlargement.  Sars Corona Virus 2  - negative  LDL - 49  HgbA1c - 5.2  UDS - not performed  VTE prophylaxis - IV Heparin   Diet  - Heart healthy with thin liquids.  aspirin 81 mg daily and clopidogrel 75 mg daily prior to admission, now on aspirin 325 mg daily IV Heparin added  Patient counseled to be compliant with her antithrombotic medications  Ongoing aggressive stroke risk factor management  Therapy recommendations:  pending  Disposition:  Pending  Hypertension  Blood pressure somewhat low at times.  Permissive hypertension (OK if < 220/120) but gradually normalize in 5-7 days . Long-term BP goal normotensive   Hyperlipidemia  Lipid lowering medication PTA:  Lipitor 40 mg daily  LDL 45, goal < 70  Current lipid lowering medication:  Lipitor 40 mg daily  Continue statin at discharge   Other Stroke Risk  Factors  Advanced age  Cigarette smoker - advised to stop smoking  Atrial fibrillation - not anticoagulated  CAD - s/p stent placement  Other Active Problems  Anemia - Hb - 10.5->9.3  Hypokalemia - 3.1 - supplemented  Creatinine - 1.45->0.97  Possible pneumonia by CXR - Zithromax / Rocephin   Hospital day # 1  I have personally obtained history,examined this patient, reviewed notes, independently viewed imaging studies, participated in medical decision making and plan of care.ROS completed by me personally and pertinent positives fully documented  I have made any additions or clarifications directly to the above note.  She presented with confusion and increasing falls likely due to subacute left PCA infarct from atrial fibrillation and not on anticoagulation.  Recommend continuing ongoing stroke work-up.  She is been started on IV heparin but we will need to carefully weigh risk benefit of long-term anticoagulation given her history of mild dementia, fall risk with family prior to switching her to Eliquis prior to discharge.  Discussed with Dr. Rhona Leavenshiu.  Greater than 50% time during this 35-minute visit was spent on counseling and coordination of care about her embolic stroke discussion about risk benefit of anticoagulation for A. fib and stroke prevention Delia HeadyPramod Nevyn Bossman, MD Medical Director [facility] Pager: [phone] 03/06/2019 12:39 PM   To contact Stroke Continuity provider, please refer to [email or web address]. After hours, contact General Neurology

## 2019-03-06 NOTE — ED Provider Notes (Signed)
[facility]ESS[facility] Provider Note   CSN: [id number] Arrival date & time: 03/05/19  1708    History   Chief Complaint Chief Complaint  Patient presents with   Altered Mental Status   Fall   Atrial Fibrillation    HPI Sherry [name] is a 85 y.o. female.     HPI   85 year old female with a history of COPD, hypertension, hypercholesterolemia, presents with concern for altered mental status and frequent falls over the last week.  Family reports that she is normally self-sufficient, living with her daughter, however over the last week she has developed generalized weakness, confusion, and frequent falls.  Daughter denies seeing any focal numbness or weakness.  She is not sure if the patient has a visual changes.  Denies her having fever, cough, vomiting, diarrhea.  Reports that her appetite has been low.  History is limited by change in patient's mental status.  She denies any pain.  She is not sure why she is in the emergency department.  Past Medical History:  Diagnosis Date   COPD (chronic obstructive pulmonary disease) (HCC)    Hypercholesteremia    Hypertension     Patient Active Problem List   Diagnosis Date Noted   Acute CVA (cerebrovascular accident) (HCC) 03/05/2019   CAP (community acquired pneumonia) 03/05/2019   UTI (urinary tract infection) 03/05/2019   Hypoglycemia 03/05/2019   Fever 10/11/2017   Hypokalemia 10/11/2017   Tachycardia 10/11/2017   Atrial fibrillation with rapid ventricular response (HCC) 10/11/2017    Past Surgical History:  Procedure Laterality Date   CORONARY ANGIOPLASTY WITH STENT PLACEMENT     HIP SURGERY       OB History   No obstetric history on file.      Home Medications    Prior to Admission medications   Medication Sig Start Date End Date Taking? Authorizing Provider  acetaminophen (TYLENOL) 500 MG tablet Take 2 tablets (1,000 mg total) by mouth every 8 (eight) hours as needed for mild pain,  fever or headache. Patient taking differently: Take 1,000 mg by mouth every 4 (four) hours as needed (for pain).  10/13/17  Yes Joseph ArtVann, Jessica U, DO  aspirin 81 MG chewable tablet Chew 81 mg by mouth daily. 12/05/13  Yes [provider]  atorvastatin (LIPITOR) 40 MG tablet Take 40 mg by mouth daily.  12/22/16  Yes [provider]  clopidogrel (PLAVIX) 75 MG tablet Take 75 mg by mouth daily. 10/05/17  Yes [provider]  diltiazem (CARDIZEM CD) 180 MG 24 hr capsule Take 1 capsule (180 mg total) by mouth daily. 10/13/17  Yes Vann, Jessica U, DO  hydrOXYzine (ATARAX/VISTARIL) 50 MG tablet Take 50 mg by mouth daily.  01/20/19  Yes [provider]  telmisartan (MICARDIS) 40 MG tablet Take 40 mg by mouth daily. 12/31/18  Yes [provider]  traMADol (ULTRAM) 50 MG tablet Take 50 mg by mouth daily as needed (for pain).   Yes [provider]  cephALEXin (KEFLEX) 500 MG capsule Take 1 capsule (500 mg total) by mouth 2 (two) times daily. Patient not taking: Reported on 03/05/2019 10/13/17   Joseph ArtVann, Jessica U, DO  ipratropium-albuterol (DUONEB) 0.5-2.5 (3) MG/3ML SOLN Take 3 mLs by nebulization every 4 (four) hours as needed (sob/wheezing). Patient not taking: Reported on 03/05/2019 10/13/17   Joseph ArtVann, Jessica U, DO    Family History Family History  Problem Relation Age of Onset   Heart attack Mother     Social History  Social History   Tobacco Use   Smoking status: Current Every Day Smoker    Packs/day: 1.00    Types: Cigarettes   Smokeless tobacco: Never Used  Substance Use Topics   Alcohol use: No    Frequency: Never   Drug use: No     Allergies   Patient has no known allergies.   Review of Systems Review of Systems  Unable to perform ROS: Mental status change  Constitutional: Positive for appetite change and fatigue. Negative for fever.  Eyes: Visual disturbance: reports not sure.  Respiratory: Negative for cough.   Gastrointestinal:  Negative for vomiting.  Neurological: Negative for weakness and numbness.     Physical Exam Updated Vital Signs BP (!) 107/59 (BP Location: Right Arm)    Pulse 73    Temp 98.2 F (36.8 C) (Oral)    Resp 16    Ht  (1.575 m)    Wt 47.6 kg    SpO2 96%    BMI 19.20 kg/m   Physical Exam Vitals signs and nursing note reviewed.  Constitutional:      General: She is not in acute distress.    Appearance: She is well-developed. She is not diaphoretic.  HENT:     Head: Normocephalic.     Comments: Periorbital and forehead contusions Eyes:     General: Visual field deficit: unclear if deficit or if difficulty following instructions.     Conjunctiva/sclera: Conjunctivae normal.  Neck:     Musculoskeletal: Normal range of motion.  Cardiovascular:     Rate and Rhythm: Tachycardia present. Rhythm irregularly irregular.     Heart sounds: Normal heart sounds. No murmur. No friction rub. No gallop.   Pulmonary:     Effort: Pulmonary effort is normal. No respiratory distress.     Breath sounds: Normal breath sounds. No wheezing or rales.  Abdominal:     General: There is no distension.     Palpations: Abdomen is soft.     Tenderness: There is no abdominal tenderness. There is no guarding.  Musculoskeletal:        General: No tenderness.     Comments: Left leg shorter than right but denies any pain, has full ROM   Skin:    General: Skin is warm and dry.     Findings: No erythema or rash.  Neurological:     Mental Status: She is alert.     Cranial Nerves: No cranial nerve deficit.     Sensory: Sensation is intact.     Motor: Motor function is intact.     Comments: Oriented to self, location in hospital      ED Treatments / Results  Labs (all labs ordered are listed, but only abnormal results are displayed) Labs Reviewed  COMPREHENSIVE METABOLIC PANEL - Abnormal; Notable for the following components:      Result Value   Sodium 134 (*)    CO2 17 (*)    Glucose, Bld 69 (*)     BUN 28 (*)    Creatinine, Ser 1.45 (*)    Calcium 8.7 (*)    Total Protein 6.0 (*)    Albumin 3.2 (*)    AST 43 (*)    GFR calc non Af Amer 32 (*)    GFR calc Af Amer 37 (*)    All other components within normal limits  CBC WITH DIFFERENTIAL/PLATELET - Abnormal; Notable for the following components:   Hemoglobin 10.5 (*)    HCT 33.6 (*)  RDW 24.6 (*)    All other components within normal limits  URINALYSIS, ROUTINE W REFLEX MICROSCOPIC - Abnormal; Notable for the following components:   Color, Urine AMBER (*)    APPearance CLOUDY (*)    Ketones, ur 5 (*)    Protein, ur 100 (*)    Leukocytes,Ua SMALL (*)    Bacteria, UA MANY (*)    All other components within normal limits  CK - Abnormal; Notable for the following components:   Total CK 268 (*)    All other components within normal limits  IRON AND TIBC - Abnormal; Notable for the following components:   Iron 15 (*)    Saturation Ratios 5 (*)    All other components within normal limits  RETICULOCYTES - Abnormal; Notable for the following components:   RBC. 3.50 (*)    Immature Retic Fract 21.7 (*)    All other components within normal limits  BASIC METABOLIC PANEL - Abnormal; Notable for the following components:   Potassium 3.1 (*)    CO2 18 (*)    Calcium 8.1 (*)    GFR calc non Af Amer 53 (*)    All other components within normal limits  HEPARIN LEVEL (UNFRACTIONATED) - Abnormal; Notable for the following components:   Heparin Unfractionated <0.10 (*)    All other components within normal limits  CBC - Abnormal; Notable for the following components:   RBC 3.50 (*)    Hemoglobin 9.3 (*)    HCT 28.3 (*)    RDW 24.2 (*)    All other components within normal limits  MAGNESIUM - Abnormal; Notable for the following components:   Magnesium 1.6 (*)    All other components within normal limits  GLUCOSE, CAPILLARY - Abnormal; Notable for the following components:   Glucose-Capillary 104 (*)    All other components  within normal limits  CBG MONITORING, ED - Abnormal; Notable for the following components:   Glucose-Capillary 63 (*)    All other components within normal limits  CBG MONITORING, ED - Abnormal; Notable for the following components:   Glucose-Capillary 179 (*)    All other components within normal limits  URINE CULTURE  SARS CORONAVIRUS 2 (HOSPITAL ORDER, PERFORMED IN [facility])  CULTURE, BLOOD (ROUTINE X 2)  CULTURE, BLOOD (ROUTINE X 2)  LACTIC ACID, PLASMA  PROTIME-INR  HEMOGLOBIN A1C  LIPID PANEL  VITAMIN B12  FOLATE  FERRITIN  PROCALCITONIN  GLUCOSE, CAPILLARY  GLUCOSE, CAPILLARY  HEPARIN LEVEL (UNFRACTIONATED)    EKG None  Afib with RVR, new from prior  Nonspecific ST changes likely rate related  Radiology Ct Head Wo Contrast  Result Date: 03/05/2019 CLINICAL DATA:  Frequent falls.  Altered mental status. EXAM: CT HEAD WITHOUT CONTRAST CT CERVICAL SPINE WITHOUT CONTRAST TECHNIQUE: Multidetector CT imaging of the head and cervical spine was performed following the standard protocol without intravenous contrast. Multiplanar CT image reconstructions of the cervical spine were also generated. COMPARISON:  None. FINDINGS: CT HEAD FINDINGS Brain: No evidence of parenchymal hemorrhage or extra-axial fluid collection. No mass lesion, mass effect, or midline shift. Loss of gray-white differentiation in medial left occipital lobe with associated mild sulcal effacement. Generalized cerebral volume loss. Nonspecific prominent subcortical and periventricular white matter hypodensity, most in keeping with chronic small vessel ischemic change. Cerebral ventricle sizes are concordant with the degree of cerebral volume loss. Vascular: No acute abnormality. Skull: No evidence of calvarial fracture. Sinuses/Orbits: The visualized paranasal sinuses are essentially clear. Other:  The mastoid air cells are unopacified. CT CERVICAL SPINE FINDINGS Alignment: Mild straightening of the  cervical spine. No facet subluxation. Dens is well positioned between the lateral masses of C1. Minimal 2 mm anterolisthesis at C3-4, C4-5 and C7-T1. Skull base and vertebrae: No acute fracture. No primary bone lesion or focal pathologic process. Soft tissues and spinal canal: No prevertebral edema. No visible canal hematoma. Disc levels: Marked multilevel cervical degenerative disc disease, most prominent at C5-6. Advanced bilateral facet arthropathy. Moderate bilateral degenerative foraminal stenosis at C3-4. Severe degenerative foraminal stenosis on the right at C4-5. Mild to moderate degenerative foraminal stenosis on the left at C5-6 and C6-7. Upper chest: No acute abnormality. Other: Visualized mastoid air cells appear clear. No discrete thyroid nodules. No pathologically enlarged cervical nodes. IMPRESSION: 1. Loss of gray-white differentiation in the medial left occipital lobe with associated mild sulcal effacement, suspicious for acute/subacute nonhemorrhagic infarct. 2. No acute intracranial hemorrhage. 3. Generalized cerebral volume loss and prominent chronic small vessel ischemic changes in the cerebral white matter. 4. No cervical spine fracture or facet subluxation. 5. Advanced degenerative changes throughout the cervical spine as detailed. Critical Value/emergent results were called by telephone at the time of interpretation on 03/05/2019 at 6:52 pm to Dr. Alvira MondayERIN Kaysey Berndt , who verbally acknowledged these results. Electronically Signed   By: Delbert PhenixJason A Poff M.D.   On: 03/05/2019 18:55   Ct Cervical Spine Wo Contrast  Result Date: 03/05/2019 CLINICAL DATA:  Frequent falls.  Altered mental status. EXAM: CT HEAD WITHOUT CONTRAST CT CERVICAL SPINE WITHOUT CONTRAST TECHNIQUE: Multidetector CT imaging of the head and cervical spine was performed following the standard protocol without intravenous contrast. Multiplanar CT image reconstructions of the cervical spine were also generated. COMPARISON:  None.  FINDINGS: CT HEAD FINDINGS Brain: No evidence of parenchymal hemorrhage or extra-axial fluid collection. No mass lesion, mass effect, or midline shift. Loss of gray-white differentiation in medial left occipital lobe with associated mild sulcal effacement. Generalized cerebral volume loss. Nonspecific prominent subcortical and periventricular white matter hypodensity, most in keeping with chronic small vessel ischemic change. Cerebral ventricle sizes are concordant with the degree of cerebral volume loss. Vascular: No acute abnormality. Skull: No evidence of calvarial fracture. Sinuses/Orbits: The visualized paranasal sinuses are essentially clear. Other:  The mastoid air cells are unopacified. CT CERVICAL SPINE FINDINGS Alignment: Mild straightening of the cervical spine. No facet subluxation. Dens is well positioned between the lateral masses of C1. Minimal 2 mm anterolisthesis at C3-4, C4-5 and C7-T1. Skull base and vertebrae: No acute fracture. No primary bone lesion or focal pathologic process. Soft tissues and spinal canal: No prevertebral edema. No visible canal hematoma. Disc levels: Marked multilevel cervical degenerative disc disease, most prominent at C5-6. Advanced bilateral facet arthropathy. Moderate bilateral degenerative foraminal stenosis at C3-4. Severe degenerative foraminal stenosis on the right at C4-5. Mild to moderate degenerative foraminal stenosis on the left at C5-6 and C6-7. Upper chest: No acute abnormality. Other: Visualized mastoid air cells appear clear. No discrete thyroid nodules. No pathologically enlarged cervical nodes. IMPRESSION: 1. Loss of gray-white differentiation in the medial left occipital lobe with associated mild sulcal effacement, suspicious for acute/subacute nonhemorrhagic infarct. 2. No acute intracranial hemorrhage. 3. Generalized cerebral volume loss and prominent chronic small vessel ischemic changes in the cerebral white matter. 4. No cervical spine fracture or  facet subluxation. 5. Advanced degenerative changes throughout the cervical spine as detailed. Critical Value/emergent results were called by telephone at the time of interpretation on 03/05/2019 at 6:52  pm to Dr. Gareth Morgan , who verbally acknowledged these results. Electronically Signed   By: Ilona Sorrel M.D.   On: 03/05/2019 18:55   Dg Chest Portable 1 View  Result Date: 03/05/2019 CLINICAL DATA:  Fever EXAM: PORTABLE CHEST 1 VIEW COMPARISON:  10/11/2017 chest radiograph. FINDINGS: Stable cardiomediastinal silhouette with mild cardiomegaly. No pneumothorax. No pleural effusion. Suggestion of a mild patchy lingular opacity. No pulmonary edema. IMPRESSION: Suggestion of a mild patchy lingular opacity, cannot exclude pneumonia. Chest radiograph follow-up advised. Electronically Signed   By: Ilona Sorrel M.D.   On: 03/05/2019 17:47    Procedures .Critical Care Performed by: Gareth Morgan, MD Authorized by: Gareth Morgan, MD   Critical care provider statement:    Critical care time (minutes):  30   Critical care was time spent personally by me on the following activities:  Discussions with consultants, evaluation of patient's response to treatment, examination of patient, ordering and performing treatments and interventions, ordering and review of laboratory studies, ordering and review of radiographic studies, pulse oximetry, re-evaluation of patient's condition, obtaining history from patient or surrogate and review of old charts   (including critical care time)  Medications Ordered in ED Medications  sodium chloride flush (NS) 0.9 % injection 3 mL (3 mLs Intravenous Not Given 03/05/19 1729)  cefTRIAXone (ROCEPHIN) 1 g in sodium chloride 0.9 % 100 mL IVPB (has no administration in time range)  azithromycin (ZITHROMAX) 500 mg in sodium chloride 0.9 % 250 mL IVPB (has no administration in time range)  acetaminophen (TYLENOL) tablet 650 mg (650 mg Oral Given 03/06/19 0618)    Or    acetaminophen (TYLENOL) solution 650 mg ( Per Tube See Alternative 03/06/19 0618)    Or  acetaminophen (TYLENOL) suppository 650 mg ( Rectal See Alternative 03/06/19 0618)  aspirin suppository 300 mg ( Rectal See Alternative 03/06/19 0907)    Or  aspirin tablet 325 mg (325 mg Oral Given 03/06/19 0907)  senna-docusate (Senokot-S) tablet 1 tablet (has no administration in time range)  dextrose 5 %-0.9 % sodium chloride infusion ( Intravenous New Bag/Given 03/05/19 2018)  atorvastatin (LIPITOR) tablet 40 mg (has no administration in time range)  dextrose 50 % solution (  Not Given 03/05/19 2027)  diltiazem (CARDIZEM) 100 mg in dextrose 5% 112mL (1 mg/mL) infusion (0 mg/hr Intravenous Stopped 03/06/19 1412)  heparin ADULT infusion 100 units/mL (25000 units/26mL sodium chloride 0.45%) (800 Units/hr Intravenous Rate/Dose Change 03/06/19 0738)  potassium chloride SA (K-DUR) CR tablet 40 mEq (40 mEq Oral Given 03/06/19 0907)  diltiazem (CARDIZEM) tablet 90 mg (90 mg Oral Given 03/06/19 1250)  cefTRIAXone (ROCEPHIN) 1 g in sodium chloride 0.9 % 100 mL IVPB (0 g Intravenous Stopped 03/05/19 2005)  azithromycin (ZITHROMAX) 500 mg in sodium chloride 0.9 % 250 mL IVPB (0 mg Intravenous Stopped 03/05/19 2005)  sodium chloride 0.9 % bolus 1,000 mL (1,000 mLs Intravenous New Bag/Given 03/05/19 1905)   stroke: mapping our early stages of recovery book ( Does not apply Given 03/06/19 0343)  dextrose 50 % solution 50 mL (50 mLs Intravenous Given 03/05/19 2005)  magnesium sulfate IVPB 2 g 50 mL (2 g Intravenous New Bag/Given 03/06/19 1215)     Initial Impression / Assessment and Plan / ED Course  I have reviewed the triage vital signs and the nursing notes.  Pertinent labs & imaging results that were available during my care of the patient were reviewed by me and considered in my medical decision making (see chart for details).  85 year old female with a history of COPD, hypertension, hypercholesterolemia, presents with  concern for altered mental status and frequent falls over the last week.  No sign of traumatic injuries by history, exam and CT Head and CSpine.  CT head does show finding suspicious for acute/subacute infarct to left occipital lobe.  CXR concerning for possible pneumonia, and urinalysis shows many bacteria.  Lactate normal.  Tachycardia with afib likely in setting of infection and potentially missing her medication dose.  Given IV fluids, rocephin/azithromycin. Consulted Neurology for CVA. Admitted to hospitalist for further care.    Final Clinical Impressions(s) / ED Diagnoses   Final diagnoses:  Cerebrovascular accident (CVA), unspecified mechanism (HCC)  Altered mental status, unspecified altered mental status type  Community acquired pneumonia of left lung, unspecified part of lung  Atrial fibrillation, unspecified type [facility])    ED Discharge Orders    None       Alvira Monday, MD 03/06/19 1519

## 2019-03-06 NOTE — Progress Notes (Signed)
[facility] for Heparin Indication: atrial fibrillation, acute CVA  Heparin Dosing Weight: 47.6 kg  Labs: Recent Labs    03/05/19 1720 03/06/19 0529 03/06/19 1517  HGB 10.5* 9.3*  --   HCT 33.6* 28.3*  --   PLT 228 209  --   LABPROT 14.8  --   --   INR 1.2  --   --   HEPARINUNFRC  --  <0.10* 0.21*  CREATININE 1.45* 0.97  --   CKTOTAL 268*  --   --     Assessment: 89 yof with hx of afib not on anticoagulation PTA presenting with falls, afib with RVR, subacute ischemic stroke. Pharmacy consulted to dose heparin per stroke protocol. Hg 10.5, plt wnl. No active bleed issues documented. Estimated weight in the ER 105 lbs per discussion with RN.  Heparin level this afternoon remains below goal but is now trending up to 0.21, H/H down slightly this am, heparin infusion paused briefly around time lab drew level due to tubing issues.  Goal of Therapy:  Heparin level 0.3-0.5 units/ml, no boluses Monitor platelets by anticoagulation protocol: Yes   Plan:  -Increase heparin to 900 units/hr -Recheck 8hr heparin level   Sherry [name], PharmD, BCPS Clinical Pharmacist [phone] Please check AMION for all [facility] numbers 03/06/2019

## 2019-03-06 NOTE — Evaluation (Signed)
Speech Language Pathology Evaluation Patient Details Name: Sherry [name] MRN: [id number] DOB: September 04, 1928 Today's Date: 03/06/2019 Time: 1210-1220 SLP Time Calculation (min) (ACUTE ONLY): 10 min  Problem List:  Patient Active Problem List   Diagnosis Date Noted  . Acute CVA (cerebrovascular accident) ([place]) 03/05/2019  . CAP (community acquired pneumonia) 03/05/2019  . UTI (urinary tract infection) 03/05/2019  . Hypoglycemia 03/05/2019  . Fever 10/11/2017  . Hypokalemia 10/11/2017  . Tachycardia 10/11/2017  . Atrial fibrillation with rapid ventricular response ([place]) 10/11/2017   Past Medical History:  Past Medical History:  Diagnosis Date  . COPD (chronic obstructive pulmonary disease) ([place])   . Hypercholesteremia   . Hypertension    Past Surgical History:  Past Surgical History:  Procedure Laterality Date  . CORONARY ANGIOPLASTY WITH STENT PLACEMENT    . HIP SURGERY     HPI:      Assessment / Plan / Recommendation Clinical Impression  Speech and language function are WNL. Discussed pt with her daughter who reports baseline short and long term memory impairment consistent with dementia with appropriate assist from family already in place. Daughter has expectation that pt can carry on a conversation and verbalize wants and needs. In assessment pt perfectly pleasant, able to participate in social language, make choices for meal and sustain attention long enough to participate in basic tasks. Memory obviously impaired. No SLP f/u warranted as pts primary needs are physical. Will sign off.     SLP Assessment  SLP Recommendation/Assessment: Patient does not need any further Speech Lanaguage Pathology Services    Follow Up Recommendations       Frequency and Duration           SLP Evaluation Cognition  Overall Cognitive Status: History of cognitive impairments - at baseline Arousal/Alertness: Awake/alert Orientation Level: Oriented to person;Disoriented to place;Disoriented  to time;Disoriented to situation Attention: Focused;Sustained Focused Attention: Appears intact Sustained Attention: Appears intact Memory: Impaired Memory Impairment: Decreased short term memory Decreased Short Term Memory: Verbal basic Awareness: Impaired Awareness Impairment: Intellectual impairment Problem Solving: Impaired Problem Solving Impairment: Verbal basic;Functional basic       Comprehension  Auditory Comprehension Overall Auditory Comprehension: Appears within functional limits for tasks assessed    Expression Verbal Expression Overall Verbal Expression: Appears within functional limits for tasks assessed   Oral / Motor  Oral Motor/Sensory Function Overall Oral Motor/Sensory Function: Within functional limits Motor Speech Overall Motor Speech: Appears within functional limits for tasks assessed   GO                   Herbie [place], MA CCC-SLP  Acute Rehabilitation Services Pager [phone] Office [phone]  Lynann Beaver 03/06/2019, 12:38 PM

## 2019-03-06 NOTE — Progress Notes (Signed)
PT Cancellation Note  Patient Details Name: Sherry [name] MRN: [id number] DOB: 1928-10-03   Cancelled Treatment:    Reason Eval/Treat Not Completed: Patient at procedure or test/unavailable   Shary Decamp [facility] 03/06/2019, 2:13 PM [place] Pager [phone] Office [phone]

## 2019-03-06 NOTE — Progress Notes (Signed)
  Echocardiogram 2D Echocardiogram has been performed.  [place] Deputy Mads Borgmeyer 03/06/2019, 10:43 AM

## 2019-03-06 NOTE — Evaluation (Signed)
Occupational Therapy Evaluation Patient Details Name: Sherry [name] Hamad MRN: [id number] DOB: 22-Feb-1929 Today's Date: 03/06/2019    History of Present Illness Sherry [name] Jungbluth is a 85 y.o. female presenting to the hospital via EMS for evaluation of worsening falls. with medical history significant of COPD, hypertension, hyperlipidemia, CAD status post stent, A. fib not on anticoagulation. Negative CT head. C-Collar in room    Clinical Impression   Pt PTA: Daughter is living wit pt. Pt reports that she was doing well with ADL and mobility. Per chart, pt was assisted by daughter. Pt currently limited by poor strength, poor balance, poor activity tolerance and increased assist from caregiver to complete ADL and mobility. Pt A/O to self and place. Pt followed all commands. Unable to get pt to comprehend visual scanning task so vision to be tested in later setting. Pt using RW for stability with transfers and ambulating 10' x2 times. Pt stood at sink for toiler hygiene ~1 min and seated in chair for grooming. Pt minA for most UB ADL due to weakness and maxA for LB ADL. Pt would benefit from continued OT skilled services for ADL, mobility and safety in SNF setting. OT to follow acutely.    Follow Up Recommendations  SNF;Supervision/Assistance - 24 hour    Equipment Recommendations  3 in 1 bedside commode    Recommendations for Other Services       Precautions / Restrictions Precautions Precautions: Fall Precaution Comments: has c-collar in room, but negative for C spine fxs Restrictions Weight Bearing Restrictions: No      Mobility Bed Mobility Overal bed mobility: Needs Assistance Bed Mobility: Supine to Sit;Sit to Supine     Supine to sit: Min assist Sit to supine: Min guard   General bed mobility comments: assist for smoothness of movements and to avoid plopping  Transfers Overall transfer level: Needs assistance Equipment used: Rolling walker (2 wheeled) Transfers: Sit to/from  UGI CorporationStand;Stand Pivot Transfers Sit to Stand: Mod assist Stand pivot transfers: Mod assist       General transfer comment: modA for power up with RW    Balance Overall balance assessment: Needs assistance Sitting-balance support: Bilateral upper extremity supported Sitting balance-Leahy Scale: Poor Sitting balance - Comments: requires cues to lean forward after a few mins, pt fatigues easily. Postural control: Posterior lean Standing balance support: Bilateral upper extremity supported;During functional activity Standing balance-Leahy Scale: Poor Standing balance comment: requires external support                           ADL either performed or assessed with clinical judgement   ADL Overall ADL's : Needs assistance/impaired Eating/Feeding: Set up;Bed level   Grooming: Wash/dry hands;Wash/dry face;Oral care;Min guard;Standing   Upper Body Bathing: Minimal assistance;Sitting   Lower Body Bathing: Maximal assistance;Sitting/lateral leans;Sit to/from stand   Upper Body Dressing : Minimal assistance;Sitting   Lower Body Dressing: Maximal assistance;Sitting/lateral leans;Sit to/from stand   Toilet Transfer: Minimal assistance;BSC   Toileting- Clothing Manipulation and Hygiene: Maximal assistance;Sitting/lateral lean;Sit to/from stand       Functional mobility during ADLs: Minimal assistance;Rolling walker;Cueing for safety;Cueing for sequencing General ADL Comments: Pt set-upA for grooming in sitting. Pt stood for toilet hygiene from [facility] and maxA.     Vision Baseline Vision/History: No visual deficits Vision Assessment?: No apparent visual deficits Additional Comments: Appeared, WFL.     Perception     Praxis      Pertinent Vitals/Pain Pain Assessment: No/denies pain  Hand Dominance Right   Extremity/Trunk Assessment Upper Extremity Assessment Upper Extremity Assessment: Generalized weakness;RUE deficits/detail;LUE deficits/detail RUE Deficits /  Details: decreased ROM at shoulders decreased grip strength RUE Coordination: decreased fine motor LUE Deficits / Details: decreased ROM at shoulders decreased grip strength LUE Coordination: decreased fine motor   Lower Extremity Assessment Lower Extremity Assessment: Defer to PT evaluation   Cervical / Trunk Assessment Cervical / Trunk Assessment: Kyphotic   Communication Communication Communication: HOH   Cognition Arousal/Alertness: Awake/alert Behavior During Therapy: WFL for tasks assessed/performed Overall Cognitive Status: History of cognitive impairments - at baseline                                 General Comments: Followed all commands.   General Comments  Pt requires assist with RW as pt unable to fully maneuver around obstacles.    Exercises     Shoulder Instructions      Home Living Family/patient expects to be discharged to:: Private residence Living Arrangements: Children Available Help at Discharge: Family;Available 24 hours/day Type of Home: House       Home Layout: One level     Bathroom Shower/Tub: Producer, television/film/videoWalk-in shower   Bathroom Toilet: Handicapped height     Home Equipment: Environmental consultantWalker - 2 wheels          Prior Functioning/Environment Level of Independence: Needs assistance  Gait / Transfers Assistance Needed: reports ambulatory ADL's / Homemaking Assistance Needed: reports nearly independent. "my daughter is there when I need her."   Comments: Pt reports  "I pretty much do everything myself." Per chart, pt's daughter has been living wth her to assist with ADL and mobility.        OT Problem List: Decreased strength;Decreased activity tolerance;Impaired balance (sitting and/or standing);Decreased coordination;Decreased safety awareness;Impaired UE functional use      OT Treatment/Interventions: Self-care/ADL training;Therapeutic exercise;Neuromuscular education;Energy conservation;DME and/or AE instruction;Therapeutic  activities;Visual/perceptual remediation/compensation;Patient/family education;Balance training    OT Goals(Current goals can be found in the care plan section) Acute Rehab OT Goals Patient Stated Goal: to go home OT Goal Formulation: With patient Time For Goal Achievement: 03/20/19 Potential to Achieve Goals: Good ADL Goals Pt Will Perform Grooming: with supervision;standing Pt Will Perform Lower Body Dressing: with supervision;sit to/from stand Pt Will Perform Toileting - Clothing Manipulation and hygiene: with supervision;sit to/from stand;sitting/lateral leans Additional ADL Goal #1: Pt will be supervisionA level for OOB ADL with fair balance and least restrictive AD.  OT Frequency: Min 2X/week   Barriers to D/C:            Co-evaluation              AM-PAC OT "6 Clicks" Daily Activity     Outcome Measure Help from another person eating meals?: A Little Help from another person taking care of personal grooming?: A Little Help from another person toileting, which includes using toliet, bedpan, or urinal?: A Lot Help from another person bathing (including washing, rinsing, drying)?: A Lot Help from another person to put on and taking off regular upper body clothing?: A Lot Help from another person to put on and taking off regular lower body clothing?: A Lot 6 Click Score: 14   End of Session Equipment Utilized During Treatment: Gait belt;Rolling walker Nurse Communication: Mobility status  Activity Tolerance: Patient tolerated treatment well Patient left: in bed;with call bell/phone within reach;with bed alarm set  OT Visit Diagnosis: Unsteadiness on feet (R26.81);Muscle weakness (  generalized) (M62.81);Repeated falls (R29.6)                Time: 1100-1155 OT Time Calculation (min): 55 min Charges:  OT General Charges $OT Visit: 1 Visit OT Evaluation $OT Eval Moderate Complexity: 1 Mod OT Treatments $Self Care/Home Management : 23-37 mins $Neuromuscular  Re-education: 8-22 mins  Darryl Nestle) Marsa Aris OTR/[name] Acute Rehabilitation Services Pager: [phone] Office: [place] 03/06/2019, 1:07 PM

## 2019-03-06 NOTE — Progress Notes (Signed)
[facility] for Heparin Indication: atrial fibrillation, acute CVA  Heparin Dosing Weight: 47.6 kg  Labs: Recent Labs    03/05/19 1720 03/06/19 0529  HGB 10.5* 9.3*  HCT 33.6* 28.3*  PLT 228 209  LABPROT 14.8  --   INR 1.2  --   HEPARINUNFRC  --  <0.10*  CREATININE 1.45* 0.97  CKTOTAL 268*  --     Assessment: 89 yof with hx of afib not on anticoagulation PTA presenting with falls, afib with RVR, subacute ischemic stroke. Pharmacy consulted to dose heparin per stroke protocol. Hg 10.5, plt wnl. No active bleed issues documented. Estimated weight in the ER 105 lbs per discussion with RN.  7/5 AM update: heparin level undetectable this AM  Goal of Therapy:  Heparin level 0.3-0.5 units/ml, no boluses Monitor platelets by anticoagulation protocol: Yes   Plan:  Inc heparin to 800 units/hr Re-check heparin level at 1500 Monitor daily heparin level and CBC, s/sx bleeding  Narda Bonds, PharmD, BCPS Clinical Pharmacist Phone: [phone]

## 2019-03-06 NOTE — Progress Notes (Signed)
RN unable to go down to MRI with patient at the time. Pt needed RN to go because of Cardizem and Tele.

## 2019-03-07 LAB — BASIC METABOLIC PANEL
Anion gap: 10 (ref 5–15)
BUN: 22 mg/dL (ref 8–23)
CO2: 17 mmol/L — ABNORMAL LOW (ref 22–32)
Calcium: 8 mg/dL — ABNORMAL LOW (ref 8.9–10.3)
Chloride: 111 mmol/L (ref 98–111)
Creatinine, Ser: 1.07 mg/dL — ABNORMAL HIGH (ref 0.44–1.00)
GFR calc Af Amer: 54 mL/min — ABNORMAL LOW (ref >60)
GFR calc non Af Amer: 47 mL/min — ABNORMAL LOW (ref >60)
Glucose, Bld: 97 mg/dL (ref 70–99)
Potassium: 3.8 mmol/L (ref 3.5–5.1)
Sodium: 138 mmol/L (ref 135–145)

## 2019-03-07 LAB — HEPARIN LEVEL (UNFRACTIONATED)
Heparin Unfractionated: 0.26 IU/mL — ABNORMAL LOW (ref 0.30–0.70)
Heparin Unfractionated: 0.46 IU/mL (ref 0.30–0.70)
Heparin Unfractionated: 0.67 IU/mL (ref 0.30–0.70)

## 2019-03-07 LAB — CBC
HCT: 26.3 % — ABNORMAL LOW (ref 36.0–46.0)
Hemoglobin: 8.6 g/dL — ABNORMAL LOW (ref 12.0–15.0)
MCH: 27 pg (ref 26.0–34.0)
MCHC: 32.7 g/dL (ref 30.0–36.0)
MCV: 82.7 fL (ref 80.0–100.0)
Platelets: 197 10*3/uL (ref 150–400)
RBC: 3.18 MIL/uL — ABNORMAL LOW (ref 3.87–5.11)
RDW: 24.9 % — ABNORMAL HIGH (ref 11.5–15.5)
WBC: 6.4 10*3/uL (ref 4.0–10.5)
nRBC: 0 % (ref 0.0–0.2)

## 2019-03-07 LAB — URINE CULTURE

## 2019-03-07 LAB — GLUCOSE, CAPILLARY
Glucose-Capillary: 109 mg/dL — ABNORMAL HIGH (ref 70–99)
Glucose-Capillary: 113 mg/dL — ABNORMAL HIGH (ref 70–99)
Glucose-Capillary: 118 mg/dL — ABNORMAL HIGH (ref 70–99)
Glucose-Capillary: 129 mg/dL — ABNORMAL HIGH (ref 70–99)
Glucose-Capillary: 99 mg/dL (ref 70–99)

## 2019-03-07 LAB — MAGNESIUM: Magnesium: 2 mg/dL (ref 1.7–2.4)

## 2019-03-07 MED ORDER — LORAZEPAM 1 MG PO TABS
1.0000 mg | ORAL_TABLET | Freq: Once | ORAL | Status: AC
  Administered 2019-03-07: 1 mg via ORAL
  Filled 2019-03-07: qty 1

## 2019-03-07 MED ORDER — SODIUM CHLORIDE 0.9 % IV SOLN
510.0000 mg | Freq: Once | INTRAVENOUS | Status: AC
  Administered 2019-03-07: 510 mg via INTRAVENOUS
  Filled 2019-03-07: qty 17

## 2019-03-07 MED ORDER — FERROUS SULFATE 325 (65 FE) MG PO TABS
325.0000 mg | ORAL_TABLET | Freq: Every day | ORAL | Status: DC
  Administered 2019-03-08 – 2019-03-15 (×8): 325 mg via ORAL
  Filled 2019-03-07 (×8): qty 1

## 2019-03-07 NOTE — Progress Notes (Signed)
ANTICOAGULATION CONSULT NOTE  Pharmacy Consult for Heparin Indication: atrial fibrillation, acute CVA  Heparin Dosing Weight: 47.6 kg  Labs: Recent Labs    03/05/19 1720 03/06/19 0529 03/06/19 1517 03/07/19 0252  HGB 10.5* 9.3*  --  8.6*  HCT 33.6* 28.3*  --  26.3*  PLT 228 209  --  197  LABPROT 14.8  --   --   --   INR 1.2  --   --   --   HEPARINUNFRC  --  <0.10* 0.21* 0.26*  CREATININE 1.45* 0.97  --   --   CKTOTAL 268*  --   --   --     Assessment: 85 y.o. female with Afib and CVA for heparin  Goal of Therapy:  Heparin level 0.3-0.5 units/ml, no boluses Monitor platelets by anticoagulation protocol: Yes   Plan:  Increase Heparin 1000 units/hr Check heparin level in 8 hours.   Phillis Knack, PharmD, BCPS

## 2019-03-07 NOTE — Evaluation (Signed)
Physical Therapy Evaluation Patient Details Name: Sherry [name] MRN: [id number] DOB: [birth year] Today's Date: 03/07/2019   History of Present Illness  Sherry [name] is a 85 y.o. female presenting to the hospital via EMS for evaluation of worsening falls. with medical history significant of COPD, hypertension, hyperlipidemia, CAD status post stent, A. fib not on anticoagulation. Negative CT head. C-Collar in room   Clinical Impression  Pt admitted with above. Pt confused, impulsive, demo's decreased safety awareness, and has poor memory. Pt appears to have significant vision impairment as pt trying to feed self and unable to scoop up food, appears to be depth perception deficits as well. Pt attempting to cut her food but missing the food and "cutting" the plate. Pt with generalized weakness and deconditioning. Pt with h/o daily falls. Pt would need 24/7 ASSIST not just supervision for safe d/c back to home. At this time recommending ST-SNF Upon d/c to address mentioned deficits and to regain maximal function. Acute PT to cont to follow.     Follow Up Recommendations SNF;Supervision/Assistance - 24 hour    Equipment Recommendations  None recommended by PT    Recommendations for Other Services       Precautions / Restrictions Precautions Precautions: Fall Precaution Comments: has c-collar in room, but negative for C spine fxs Restrictions Weight Bearing Restrictions: No      Mobility  Bed Mobility Overal bed mobility: Needs Assistance Bed Mobility: Supine to Sit     Supine to sit: Min assist     General bed mobility comments: pt initiated moving LEs to EOB, attempted to bring trunk up however unable requiring minA to elevate trunk and bring to EOB, pt did scoot using her hands to EOB so feet on the floor  Transfers Overall transfer level: Needs assistance Equipment used: 1 person hand held assist Transfers: Sit to/from Stand;Stand Pivot Transfers Sit to Stand: Mod assist Stand  pivot transfers: Mod assist       General transfer comment: modA to power up, minA from [facility], pt slow and guarded, pt reaching for far arm rest of BSC, pt able to take 4 tseps to [facility], tactile cues for direction and to control to descent onto [facility]  Ambulation/Gait Ambulation/Gait assistance: Min assist Gait Distance (Feet): 10 Feet Assistive device: Rolling walker (2 wheeled) Gait Pattern/deviations: Decreased stride length;Step-to pattern;Trunk flexed Gait velocity: slow Gait velocity interpretation: 1.31 - 2.62 ft/sec, indicative of limited community ambulator General Gait Details: pt with significant trunk flexion, bilat decreased foot clearance/shuffled gait pattern, minA for walker management, noted vision deficit/depth perception deficit  Stairs            Wheelchair Mobility    Modified Rankin (Stroke Patients Only) Modified Rankin (Stroke Patients Only) Pre-Morbid Rankin Score: Moderate disability Modified Rankin: Moderate disability     Balance Overall balance assessment: Needs assistance Sitting-balance support: Bilateral upper extremity supported Sitting balance-Leahy Scale: Poor Sitting balance - Comments: requires cues to lean forward after a few mins, pt fatigues easily. Postural control: Posterior lean Standing balance support: Bilateral upper extremity supported;During functional activity Standing balance-Leahy Scale: Poor Standing balance comment: requires external support                             Pertinent Vitals/Pain Pain Assessment: Faces Faces Pain Scale: No hurt    Home Living Family/patient expects to be discharged to:: Private residence Living Arrangements: Children Available Help at Discharge: Family(per patient, dtr works) Type  of Home: House Home Access: (unsure)     Home Layout: One level Home Equipment: Walker - 2 wheels Additional Comments: pt extremely poor historian, PLOF and home set up taken from OT eval    Prior  Function Level of Independence: Needs assistance   Gait / Transfers Assistance Needed: per chart pt uses RW for ambulation however pt has fallen daily for the last 2 weeks  ADL's / Homemaking Assistance Needed: pt confused, unsure of her capabilities, per chart pt was doing majority of ADLs with minimal help from daughter  Comments: pt confused and poor historian, info taken from chart H&P     Hand Dominance   Dominant Hand: Right    Extremity/Trunk Assessment   Upper Extremity Assessment Upper Extremity Assessment: Generalized weakness RUE Deficits / Details: pt using R UE functionally to feed self RUE Coordination: decreased fine motor LUE Deficits / Details: generalized weakness    Lower Extremity Assessment Lower Extremity Assessment: Generalized weakness(unable to follow commands to perform MMT)    Cervical / Trunk Assessment Cervical / Trunk Assessment: Kyphotic  Communication   Communication: HOH  Cognition Arousal/Alertness: Awake/alert Behavior During Therapy: WFL for tasks assessed/performed Overall Cognitive Status: History of cognitive impairments - at baseline                                 General Comments: pt confused, pt reports "To hell if I know" when asked where she is, when asked again 2 min later patient reports "I have no idea" pt confused, pt with poor memory/no recall of re-orientation, pt also impulsive with decreased safety awareness      General Comments General comments (skin integrity, edema, etc.): pt [facility] to [facility], with verbal cues she was able to perform pericare but required assist for backside    Exercises     Assessment/Plan    PT Assessment Patient needs continued PT services  PT Problem List Decreased strength;Decreased range of motion;Decreased activity tolerance;Decreased balance;Decreased mobility;Decreased coordination;Decreased cognition;Decreased knowledge of use of DME;Decreased safety awareness       PT  Treatment Interventions DME instruction;Gait training;Stair training;Functional mobility training;Therapeutic activities;Therapeutic exercise;Balance training    PT Goals (Current goals can be found in the Care Plan section)  Acute Rehab PT Goals Patient Stated Goal: to go home PT Goal Formulation: Patient unable to participate in goal setting Time For Goal Achievement: 03/21/19 Potential to Achieve Goals: Good    Frequency Min 4X/week   Barriers to discharge Decreased caregiver support unsure of family availability    Co-evaluation               AM-PAC PT "6 Clicks" Mobility  Outcome Measure Help needed turning from your back to your side while in a flat bed without using bedrails?: A Little Help needed moving from lying on your back to sitting on the side of a flat bed without using bedrails?: A Little Help needed moving to and from a bed to a chair (including a wheelchair)?: A Little Help needed standing up from a chair using your arms (e.g., wheelchair or bedside chair)?: A Lot Help needed to walk in hospital room?: A Lot Help needed climbing 3-5 steps with a railing? : A Lot 6 Click Score: 15    End of Session Equipment Utilized During Treatment: Gait belt Activity Tolerance: Patient tolerated treatment well Patient left: in chair;with call bell/phone within reach;with chair alarm set Nurse Communication: Mobility status  PT Visit Diagnosis: Other abnormalities of gait and mobility (R26.89);Difficulty in walking, not elsewhere classified (R26.2)    Time: [id number] PT Time Calculation (min) (ACUTE ONLY): 28 min   Charges:   PT Evaluation $PT Eval Moderate Complexity: 1 Mod PT Treatments $Gait Training: 8-22 mins        Lewis ShockAshly Keeli Roberg, PT, DPT Acute Rehabilitation Services Pager #: [phone] Office #: [phone]   Iona Hansenshly M Ava Tangney 03/07/2019, 8:55 AM

## 2019-03-07 NOTE — Progress Notes (Signed)
[place] for Heparin Indication: atrial fibrillation, acute CVA  Heparin Dosing Weight: 47.6 kg  Labs: Recent Labs    03/05/19 1720  03/06/19 0529 03/06/19 1517 03/07/19 0252 03/07/19 0445 03/07/19 1153  HGB 10.5*  --  9.3*  --  8.6*  --   --   HCT 33.6*  --  28.3*  --  26.3*  --   --   PLT 228  --  209  --  197  --   --   LABPROT 14.8  --   --   --   --   --   --   INR 1.2  --   --   --   --   --   --   HEPARINUNFRC  --    < > <0.10* 0.21* 0.26*  --  0.46  CREATININE 1.45*  --  0.97  --   --  1.07*  --   CKTOTAL 268*  --   --   --   --   --   --    < > = values in this interval not displayed.    Assessment: 77 yof with hx of afib not on anticoagulation PTA presenting with falls, afib with RVR, subacute ischemic stroke. Pharmacy consulted to dose heparin per stroke protocol.   Heparin level is now therapeutic; no bleeding reported.  Goal of Therapy:  Heparin level 0.3-0.5 units/ml, no boluses Monitor platelets by anticoagulation protocol: Yes   Plan:  Continue heparin gtt at 1000 units/hr Check confirmatory heparin level Daily heparin level and CBC  Sherry [name] D. Mina Marble, PharmD, BCPS, [place] 03/07/2019, 12:53 PM

## 2019-03-07 NOTE — Progress Notes (Signed)
[place] for Heparin Indication: atrial fibrillation, acute CVA  Heparin Dosing Weight: 47.6 kg  Labs: Recent Labs    03/05/19 1720 03/06/19 0529  03/07/19 0252 03/07/19 0445 03/07/19 1153 03/07/19 2051  HGB 10.5* 9.3*  --  8.6*  --   --   --   HCT 33.6* 28.3*  --  26.3*  --   --   --   PLT 228 209  --  197  --   --   --   LABPROT 14.8  --   --   --   --   --   --   INR 1.2  --   --   --   --   --   --   HEPARINUNFRC  --  <0.10*   < > 0.26*  --  0.46 0.67  CREATININE 1.45* 0.97  --   --  1.07*  --   --   CKTOTAL 268*  --   --   --   --   --   --    < > = values in this interval not displayed.    Assessment: 61 yof with hx of afib not on anticoagulation PTA presenting with falls, afib with RVR, subacute ischemic stroke. Pharmacy consulted to dose heparin per stroke protocol.   Heparin level now elevated.  Goal of Therapy:  Heparin level 0.3-0.5 units/ml, no boluses Monitor platelets by anticoagulation protocol: Yes   Plan:  Decrease heparin to 950 units / hr Daily heparin level and CBC  Thank you Anette Guarneri, PharmD 03/07/2019, 9:20 PM

## 2019-03-07 NOTE — TOC Initial Note (Signed)
Transition of Care [facility]) - Initial/Assessment Note    Patient Details  Name: Sherry [name] MRN: [id number] Date of Birth: 1929/07/27  [facility]) CM/SW Contact:    Pollie Friar, RN Phone Number: 03/07/2019, 3:45 PM  Clinical Narrative:                 TOC met with patient but she is confused. TOC spoke to patients daughter: Lelon Frohlich over the phone and went over the therapy recommendations. Lelon Frohlich is agreeable to SNF rehab. TOC sent her the Medicare choice and faxed pt out in the [facility] area per [place] request.  TOC following.  Expected Discharge Plan: Skilled Nursing Facility Barriers to Discharge: Continued Medical Work up   Patient Goals and CMS Choice   CMS Medicare.gov Compare Post Acute Care list provided to:: Patient Represenative (must comment)(daughter emailed list under [email or web address]) Choice offered to / list presented to : Adult Children  Expected Discharge Plan and Services Expected Discharge Plan: [place] In-house Referral: Clinical Social Work Discharge Planning Services: CM Consult Post Acute Care Choice: [place] Living arrangements for the past 2 months: Mobile Home                                      Prior Living Arrangements/Services Living arrangements for the past 2 months: Mobile Home Lives with:: Adult Children(daughter) Patient language and need for interpreter reviewed:: Yes(no needs)        Need for Family Participation in Patient Care: Yes (Comment) Care giver support system in place?: No (comment)(daughter works and can not provide 24 hour assistance)   Criminal Activity/Legal Involvement Pertinent to Current Situation/Hospitalization: No - Comment as needed  Activities of Daily Living      Permission Sought/Granted                  Emotional Assessment Appearance:: Appears stated age   Affect (typically observed): Pleasant Orientation: : Oriented to Self   Psych Involvement:  No (comment)  Admission diagnosis:  ams sepsis Patient Active Problem List   Diagnosis Date Noted  . Acute CVA (cerebrovascular accident) ([place]) 03/05/2019  . CAP (community acquired pneumonia) 03/05/2019  . UTI (urinary tract infection) 03/05/2019  . Hypoglycemia 03/05/2019  . Fever 10/11/2017  . Hypokalemia 10/11/2017  . Tachycardia 10/11/2017  . Atrial fibrillation with rapid ventricular response ([place]) 10/11/2017   PCP:  Lujean Amel, MD Pharmacy:   [id number] [place] [place] [id number]: [phone]: [phone]    Social Determinants of Health (SDOH) Interventions    Readmission Risk Interventions No flowsheet data found.

## 2019-03-07 NOTE — [place] (Signed)
[place] MEDICA[place]2 LEVEL OF CARE SCREENING TOOL     IDENTIFICATION  Patient Name: Sherry [name] Birthdate: June 01, 1929 Sex: female Admission Date (Current Location): 03/05/2019  [facility] [place]   Facility and Address:  [facility] [place], [address], [place], [place] [address]      Provider Number: [id number]  Attending Physician Name and Address:  Jerald Kiefhiu, Stephen K, MD  Relative Name and Phone Number:       Current Level of Care: Hospital Recommended Level of Care: Skilled Nursing Facility Prior Approval Number:    Date Approved/Denied:   PASRR Number: [id number]  Discharge Plan: SNF    Current Diagnoses: Patient Active Problem List   Diagnosis Date Noted  . Acute CVA (cerebrovascular accident) (HCC) 03/05/2019  . CAP (community acquired pneumonia) 03/05/2019  . UTI (urinary tract infection) 03/05/2019  . Hypoglycemia 03/05/2019  . Fever 10/11/2017  . Hypokalemia 10/11/2017  . Tachycardia 10/11/2017  . Atrial fibrillation with rapid ventricular response (HCC) 10/11/2017    Orientation RESPIRATION BLADDER Height & Weight     Self  Normal Incontinent Weight: 47.6 kg Height:  5\' 2"  (157.5 cm)  BEHAVIORAL SYMPTOMS/MOOD NEUROLOGICAL BOWEL NUTRITION STATUS      Incontinent Diet(heart health with thin liquids)  AMBULATORY STATUS COMMUNICATION OF NEEDS Skin   Extensive Assist Verbally (MASD to buttock/ facial bruising/ dry skin)                       Personal Care Assistance Level of Assistance  Bathing, Feeding, Dressing Bathing Assistance: Maximum assistance Feeding assistance: Limited assistance Dressing Assistance: Maximum assistance     Functional Limitations Info  Sight, Hearing, Speech Sight Info: Impaired Hearing Info: Impaired Speech Info: Adequate    SPECIAL CARE FACTORS FREQUENCY  PT (By licensed PT), OT (By licensed OT), Speech therapy     PT Frequency: 5x/wk OT Frequency: 5x/wk     Speech  Therapy Frequency: 5x/wk      Contractures Contractures Info: Not present    Additional Factors Info  Code Status, Allergies Code Status Info: full Allergies Info: NKA           Current Medications (03/07/2019):  This is the current hospital active medication list Current Facility-Administered Medications  Medication Dose Route Frequency Provider Last Rate Last Dose  . acetaminophen (TYLENOL) tablet 650 mg  650 mg Oral Q4H PRN John Giovanniathore, Vasundhra, MD   650 mg at 03/06/19 [id number]   Or  . acetaminophen (TYLENOL) solution 650 mg  650 mg Per Tube Q4H PRN John Giovanniathore, Vasundhra, MD       Or  . acetaminophen (TYLENOL) suppository 650 mg  650 mg Rectal Q4H PRN John Giovanniathore, Vasundhra, MD      . aspirin suppository 300 mg  300 mg Rectal Daily John Giovanniathore, Vasundhra, MD       Or  . aspirin tablet 325 mg  325 mg Oral Daily John Giovanniathore, Vasundhra, MD   325 mg at 03/07/19 1227  . atorvastatin (LIPITOR) tablet 40 mg  40 mg Oral q1800 John Giovanniathore, Vasundhra, MD   40 mg at 03/06/19 1706  . azithromycin (ZITHROMAX) 500 mg in sodium chloride 0.9 % 250 mL IVPB  500 mg Intravenous Q24H John Giovanniathore, Vasundhra, MD 250 mL/hr at 03/06/19 2007 500 mg at 03/06/19 2007  . cefTRIAXone (ROCEPHIN) 1 g in sodium chloride 0.9 % 100 mL IVPB  1 g Intravenous Q24H John Giovanniathore, Vasundhra, MD 200 mL/hr at 03/06/19 2146 1 g at 03/06/19  2146  . dextrose 5 %-0.9 % sodium chloride infusion   Intravenous Continuous Shela Leff, MD   Stopped at 03/07/19 0335  . diltiazem (CARDIZEM) 100 mg in dextrose 5% 169mL (1 mg/mL) infusion  5-15 mg/hr Intravenous Titrated Shela Leff, MD   Stopped at 03/06/19 1412  . diltiazem (CARDIZEM) tablet 90 mg  90 mg Oral Q6H Donne Hazel, MD   90 mg at 03/07/19 1243  . [START ON 03/08/2019] ferrous sulfate tablet 325 mg  325 mg Oral Q breakfast Donne Hazel, MD      . heparin ADULT infusion 100 units/mL (25000 units/230mL sodium chloride 0.45%)  1,000 Units/hr Intravenous Continuous Donne Hazel, MD 10 mL/hr  at 03/07/19 0347 1,000 Units/hr at 03/07/19 0347  . senna-docusate (Senokot-S) tablet 1 tablet  1 tablet Oral QHS PRN Shela Leff, MD      . sodium chloride flush (NS) 0.9 % injection 3 mL  3 mL Intravenous Once Shela Leff, MD         Discharge Medications: Please see discharge summary for a list of discharge medications.  Relevant Imaging Results:  Relevant Lab Results:   Additional Information SS#: [id number]  Pollie Friar, RN

## 2019-03-07 NOTE — Progress Notes (Signed)
PROGRESS NOTE    Sherry [name]  ZOX:[id number] DOB: [birth year] DOA: 03/05/2019 PCP: Darrow BussingKoirala, Dibas, MD    Brief Narrative:  85 y.o. female with medical history significant of COPD, hypertension, hyperlipidemia, CAD status post stent, A. fib not on anticoagulation presenting to the hospital via EMS for evaluation of worsening falls.  Daughter reported that patient has been having a fall almost every day for the past 2 weeks.  She had a fall 2 weeks ago and hit her head as she fell from a chair and bruised her head and eye.  She has continued to have falls every day.  She has been having worsening memory for the past few months to years.  She has been using a walker to ambulate after hip replacement surgery 5 years ago.  She requires some assistance with cooking but is able to bathe herself and take care of some of the other ADLs with minimal assistance at home.  At baseline, she is able to answer orientation questions correctly only at times.  She has had UTIs which have been treated.  Patient knows her name and that she is at a hospital.  Appears confused and not able to provide much history.  She has no complaints.  Denies any chest pain, shortness of breath, abdominal pain, or dysuria.  No other complaints.  ED Course: A. fib with RVR with rate up to 130s.  Not hypotensive.  Not febrile.  Not hypoxic.  No leukocytosis.  Lactic acid normal.  Hemoglobin 10.5, was 13.1 a year ago.  CBG 69 upon arrival to the ED.  Bicarb 17.  Anion gap 12.  BUN 28 creatinine 1.4.  Creatinine was 0.8 a year ago.  No significant LFT abnormalities.  CK not significantly elevated.  UA with negative nitrite, small amount of leukocytes, 0-5 WBCs, and many bacteria. Head CT with findings suspicious for acute/subacute nonhemorrhagic infarct in the medial left occipital lobe.  CT C-spine negative for acute finding. Patient received ceftriaxone, azithromycin, and 1 L normal saline bolus in the ED. Dr. Wilford CornerArora from neurology has been  consulted  Assessment & Plan:   Principal Problem:   Acute CVA (cerebrovascular accident) [facility](HCC) Active Problems:   Atrial fibrillation with rapid ventricular response (HCC)   CAP (community acquired pneumonia)   UTI (urinary tract infection)   Hypoglycemia  Subacute ischemic stroke, likely cardioembolic -Head CT with findings suspicious for acute/subacute nonhemorrhagic infarct in the medial left occipital lobe.  Noted to be in A. fib with RVR.  -Neurology consulted, appreciate recommendations -MRI of the brain and MRA neck reviewed. Findings of acute/subacute confluent L PCA territory infarct with petechial hemorrhage, otherwise neg for large vessel occlusion -2D echocardiogram performed and reviewed, normal LVEF -Hemoglobin A1c of 5.2 -LDL 49 -Continued on Aspirin 325 p.o. now and daily -Continue home Lipitor 40 mg daily -PT, OT, speech therapy. -now on heparin given below afib. Discussed case with on-call Cardiology. Given high risk for future stroke, recommendation for 2.5mg  eliquis bid for long term anticoagulation as benefit to anticoagulation would outweigh risk. -Discussed with neurology, plan to start oral anticoagulation at time of discharge  Possible community-acquired pneumonia Afebrile and no leukocytosis.  No lactic acidosis.  Not hypoxic.  No signs of sepsis. Chest x-ray with mild patchy lingular opacity, cannot exclude pneumonia.  -Continue ceftriaxone and azithromycin as tolerated -Afebrile at this time  Possible UTI  UA with negative nitrite, small amount of leukocytes, 0-5 WBCs, and many bacteria.  Afebrile and no leukocytosis.  No lactic acidosis.  No signs of sepsis. -Ceftriaxone started -Urine culture remains pending. Blood cx thus far neg  A. fib with RVR Rate up to 130s. CHA2DS2VASc now 6.  Not on anticoagulation at home.  Precipitating factor likely not being able to take her home Cardizem versus underlying infection (pneumonia +/- UTI). -HR improved  with cardizem gtt overnight. Will transition to PO cardizem -Heparin infusion for now. Pending PT/OT eval to determine fall risk, however given markedly high risk for future stroke, pt would likely benefit from anticoagulation. See above. Case was discussed with on-call Cardiologist. At this time, benefit to anticoagulation would outweigh risk -Plan eliquis near time of d/c  Hypoglycemia Likely secondary to decreased p.o. intake.  Hypoglycemic in the ED with CBG as low as 63.  Received D50. -Currently on d5 fluids. Stable at present  Altered mental status, frequent falls -Fall precautions -Continue to monitor for mental status changes -Conversing appropriately this AM  Normocytic anemia Hemoglobin 10.5, was 13.1 a year ago.  No signs of active bleeding. -Hemodynamically stable at this time -Iron level low at 15. Have ordered fereheme infusion  AKI BUN 28 creatinine 1.4.  Creatinine was 0.8 a year ago.  Likely prerenal secondary to dehydration. -continued on IV fluid hydration -Avoid nephrotoxic agents -repeat bmet in AM  Normal anion gap metabolic acidosis Bicarb 17, anion gap 12.  Possibly related to AKI. -continue on IV fluid hydration per above -Repeat BMP in a.m.  DVT prophylaxis: Heparin gtt Code Status: Full Family Communication: Pt in room, family not at bedside Disposition Plan: Uncertain at this time  Consultants:   Neurology  Discussed case with On-call Cardiologist  Procedures:     Antimicrobials: Anti-infectives (From admission, onward)   Start     Dose/Rate Route Frequency Ordered Stop   03/06/19 1900  cefTRIAXone (ROCEPHIN) 1 g in sodium chloride 0.9 % 100 mL IVPB     1 g 200 mL/hr over 30 Minutes Intravenous Every 24 hours 03/05/19 1944     03/06/19 1900  azithromycin (ZITHROMAX) 500 mg in sodium chloride 0.9 % 250 mL IVPB     500 mg 250 mL/hr over 60 Minutes Intravenous Every 24 hours 03/05/19 1944     03/05/19 1945  cefTRIAXone (ROCEPHIN) 1  g in sodium chloride 0.9 % 100 mL IVPB  Status:  Discontinued     1 g 200 mL/hr over 30 Minutes Intravenous  Once 03/05/19 1944 03/05/19 1947   03/05/19 1800  cefTRIAXone (ROCEPHIN) 1 g in sodium chloride 0.9 % 100 mL IVPB     1 g 200 mL/hr over 30 Minutes Intravenous  Once 03/05/19 1759 03/05/19 2005   03/05/19 1800  azithromycin (ZITHROMAX) 500 mg in sodium chloride 0.9 % 250 mL IVPB     500 mg 250 mL/hr over 60 Minutes Intravenous  Once 03/05/19 1759 03/05/19 2005      Subjective: No complaints at this time  Objective: Vitals:   03/07/19 0643 03/07/19 0858 03/07/19 1109 03/07/19 1519  BP: 113/73 (!) 120/59 (!) 103/55 (!) 101/55  Pulse: 81 (!) 59 (!) 57 71  Resp:  Temp:  (!) 97.2 F (36.2 C) 98 F (36.7 C) (!) 97.3 F (36.3 C)  TempSrc:  Axillary Oral Axillary  SpO2:  96% 95% 94%  Weight:      Height:        Intake/Output Summary (Last 24 hours) at 03/07/2019 1617 Last data filed at 03/07/2019 0901 Gross per 24 hour  Intake 955.5 ml  Output 150 ml  Net 805.5 ml   Filed Weights   03/05/19 2000  Weight: 47.6 kg    Examination: General exam: Awake, laying in bed, in nad Respiratory system: Normal respiratory effort, no wheezing Cardiovascular system: irregular rate, s1, s2 Gastrointestinal system: Soft, nondistended, positive BS Central nervous system: CN2-12 grossly intact, strength intact Extremities: Perfused, no clubbing Skin: Normal skin turgor, no notable skin lesions seen Psychiatry: Mood normal // no visual hallucinations   Data Reviewed: I have personally reviewed following labs and imaging studies  CBC: Recent Labs  Lab 03/05/19 1720 03/06/19 0529 03/07/19 0252  WBC 8.1 8.6 6.4  NEUTROABS 6.7  --   --   HGB 10.5* 9.3* 8.6*  HCT 33.6* 28.3* 26.3*  MCV 84.4 80.9 82.7  PLT 228 209 197   Basic Metabolic Panel: Recent Labs  Lab 03/05/19 1720 03/06/19 0529 03/07/19 0445  NA 134* 135 138  K 3.5 3.1* 3.8  CL 105 108 111  CO2 17* 18*  17*  GLUCOSE 69* 87 97  BUN 28* 19 22  CREATININE 1.45* 0.97 1.07*  CALCIUM 8.7* 8.1* 8.0*  MG  --  1.6* 2.0   GFR: Estimated Creatinine Clearance: 27.8 mL/min (A) (by C-G formula based on SCr of 1.07 mg/dL (H)). Liver Function Tests: Recent Labs  Lab 03/05/19 1720  AST 43*  ALT 16  ALKPHOS 58  BILITOT 0.9  PROT 6.0*  ALBUMIN 3.2*   No results for input(s): LIPASE, AMYLASE in the last 168 hours. No results for input(s): AMMONIA in the last 168 hours. Coagulation Profile: Recent Labs  Lab 03/05/19 1720  INR 1.2   Cardiac Enzymes: Recent Labs  Lab 03/05/19 1720  CKTOTAL 268*   BNP (last 3 results) No results for input(s): PROBNP in the last 8760 hours. HbA1C: Recent Labs    03/06/19 0529  HGBA1C 5.2   CBG: Recent Labs  Lab 03/06/19 1945 03/07/19 0003 03/07/19 0414 03/07/19 1110 03/07/19 1554  GLUCAP 113* 99 118* 129* 113*   Lipid Profile: Recent Labs    03/06/19 0529  CHOL 105  HDL 41  LDLCALC 49  TRIG 74  CHOLHDL 2.6   Thyroid Function Tests: No results for input(s): TSH, T4TOTAL, FREET4, T3FREE, THYROIDAB in the last 72 hours. Anemia Panel: Recent Labs    03/06/19 0529  VITAMINB12 471  FOLATE 9.8  FERRITIN 33  TIBC 321  IRON 15*  RETICCTPCT 1.1   Sepsis Labs: Recent Labs  Lab 03/05/19 1720  PROCALCITON 0.35  LATICACIDVEN 1.2    Recent Results (from the past 240 hour(s))  Culture, blood (Routine x 2)     Status: None (Preliminary result)   Collection Time: 03/05/19  5:15 PM   Specimen: BLOOD  Result Value Ref Range Status   Specimen Description BLOOD RIGHT ANTECUBITAL  Final   Special Requests   Final    BOTTLES DRAWN AEROBIC AND ANAEROBIC Blood Culture results may not be optimal due to an inadequate volume of blood received in culture bottles   Culture   Final    NO GROWTH 2 DAYS Performed at [facility], [address] [place], [place] [id number]    Report Status PENDING  Incomplete  Culture, blood (Routine x 2)      Status: None (Preliminary result)   Collection Time: 03/05/19  5:20 PM   Specimen: BLOOD LEFT FOREARM  Result Value Ref Range Status   Specimen Description BLOOD LEFT FOREARM  Final  Special Requests   Final    BOTTLES DRAWN AEROBIC AND ANAEROBIC Blood Culture results may not be optimal due to an inadequate volume of blood received in culture bottles   Culture   Final    NO GROWTH 2 DAYS Performed at [facility], [address], [place], [place] [address]    Report Status PENDING  Incomplete  Urine culture     Status: None   Collection Time: 03/05/19  5:20 PM   Specimen: Urine, Random  Result Value Ref Range Status   Specimen Description URINE, RANDOM  Final   Special Requests   Final    NONE Performed at [facility], [address] [place], [place] [address]    Culture   Final    Multiple bacterial morphotypes present, none predominant. Suggest appropriate recollection if clinically indicated.   Report Status 03/07/2019 FINAL  Final  SARS Coronavirus 2 (CEPHEID - Performed in [facility] hospital lab), Hosp Order     Status: None   Collection Time: 03/05/19  8:14 PM   Specimen: Nasopharyngeal Swab  Result Value Ref Range Status   SARS Coronavirus 2 NEGATIVE NEGATIVE Final    Comment: (NOTE) If result is NEGATIVE SARS-CoV-2 target nucleic acids are NOT DETECTED. The SARS-CoV-2 RNA is generally detectable in upper and lower  respiratory specimens during the acute phase of infection. The lowest  concentration of SARS-CoV-2 viral copies this assay can detect is 250  copies / mL. A negative result does not preclude SARS-CoV-2 infection  and should not be used as the sole basis for treatment or other  patient management decisions.  A negative result may occur with  improper specimen collection / handling, submission of specimen other  than nasopharyngeal swab, presence of viral mutation(s) within the  areas targeted by this assay, and inadequate number of viral  copies  (<250 copies / mL). A negative result must be combined with clinical  observations, patient history, and epidemiological information. If result is POSITIVE SARS-CoV-2 target nucleic acids are DETECTED. The SARS-CoV-2 RNA is generally detectable in upper and lower  respiratory specimens dur ing the acute phase of infection.  Positive  results are indicative of active infection with SARS-CoV-2.  Clinical  correlation with patient history and other diagnostic information is  necessary to determine patient infection status.  Positive results do  not rule out bacterial infection or co-infection with other viruses. If result is PRESUMPTIVE POSTIVE SARS-CoV-2 nucleic acids MAY BE PRESENT.   A presumptive positive result was obtained on the submitted specimen  and confirmed on repeat testing.  While 2019 novel coronavirus  (SARS-CoV-2) nucleic acids may be present in the submitted sample  additional confirmatory testing may be necessary for epidemiological  and / or clinical management purposes  to differentiate between  SARS-CoV-2 and other Sarbecovirus currently known to infect humans.  If clinically indicated additional testing with an alternate test  methodology [phone]) is advised. The SARS-CoV-2 RNA is generally  detectable in upper and lower respiratory sp ecimens during the acute  phase of infection. The expected result is Negative. Fact Sheet for Patients:  BoilerBrush.com.cy Fact Sheet for Healthcare Providers: https://pope.com/ This test is not yet approved or cleared by the [place] FDA and has been authorized for detection and/or diagnosis of SARS-CoV-2 by FDA under an Emergency Use Authorization (EUA).  This EUA will remain in effect (meaning this test can be used) for the duration of the COVID-19 declaration under Section 564(b)(1) of the  Act, 21 U.S.C. section 360bbb-3(b)(1), unless the authorization is terminated  or revoked sooner. Performed at [facility], [place] [address], [place], [place] [address]      Radiology Studies: Ct Head Wo Contrast  Result Date: 03/05/2019 CLINICAL DATA:  Frequent falls.  Altered mental status. EXAM: CT HEAD WITHOUT CONTRAST CT CERVICAL SPINE WITHOUT CONTRAST TECHNIQUE: Multidetector CT imaging of the head and cervical spine was performed following the standard protocol without intravenous contrast. Multiplanar CT image reconstructions of the cervical spine were also generated. COMPARISON:  None. FINDINGS: CT HEAD FINDINGS Brain: No evidence of parenchymal hemorrhage or extra-axial fluid collection. No mass lesion, mass effect, or midline shift. Loss of gray-white differentiation in medial left occipital lobe with associated mild sulcal effacement. Generalized cerebral volume loss. Nonspecific prominent subcortical and periventricular white matter hypodensity, most in keeping with chronic small vessel ischemic change. Cerebral ventricle sizes are concordant with the degree of cerebral volume loss. Vascular: No acute abnormality. Skull: No evidence of calvarial fracture. Sinuses/Orbits: The visualized paranasal sinuses are essentially clear. Other:  The mastoid air cells are unopacified. CT CERVICAL SPINE FINDINGS Alignment: Mild straightening of the cervical spine. No facet subluxation. Dens is well positioned between the lateral masses of C1. Minimal 2 mm anterolisthesis at C3-4, C4-5 and C7-T1. Skull base and vertebrae: No acute fracture. No primary bone lesion or focal pathologic process. Soft tissues and spinal canal: No prevertebral edema. No visible canal hematoma. Disc levels: Marked multilevel cervical degenerative disc disease, most prominent at C5-6. Advanced bilateral facet arthropathy. Moderate bilateral degenerative foraminal stenosis at C3-4. Severe degenerative foraminal stenosis on the right at C4-5. Mild to moderate degenerative foraminal stenosis on the left at  C5-6 and C6-7. Upper chest: No acute abnormality. Other: Visualized mastoid air cells appear clear. No discrete thyroid nodules. No pathologically enlarged cervical nodes. IMPRESSION: 1. Loss of gray-white differentiation in the medial left occipital lobe with associated mild sulcal effacement, suspicious for acute/subacute nonhemorrhagic infarct. 2. No acute intracranial hemorrhage. 3. Generalized cerebral volume loss and prominent chronic small vessel ischemic changes in the cerebral white matter. 4. No cervical spine fracture or facet subluxation. 5. Advanced degenerative changes throughout the cervical spine as detailed. Critical Value/emergent results were called by telephone at the time of interpretation on 03/05/2019 at 6:52 pm to Dr. Gareth Morgan , who verbally acknowledged these results. Electronically Signed   By: Ilona Sorrel M.D.   On: 03/05/2019 18:55   Ct Cervical Spine Wo Contrast  Result Date: 03/05/2019 CLINICAL DATA:  Frequent falls.  Altered mental status. EXAM: CT HEAD WITHOUT CONTRAST CT CERVICAL SPINE WITHOUT CONTRAST TECHNIQUE: Multidetector CT imaging of the head and cervical spine was performed following the standard protocol without intravenous contrast. Multiplanar CT image reconstructions of the cervical spine were also generated. COMPARISON:  None. FINDINGS: CT HEAD FINDINGS Brain: No evidence of parenchymal hemorrhage or extra-axial fluid collection. No mass lesion, mass effect, or midline shift. Loss of gray-white differentiation in medial left occipital lobe with associated mild sulcal effacement. Generalized cerebral volume loss. Nonspecific prominent subcortical and periventricular white matter hypodensity, most in keeping with chronic small vessel ischemic change. Cerebral ventricle sizes are concordant with the degree of cerebral volume loss. Vascular: No acute abnormality. Skull: No evidence of calvarial fracture. Sinuses/Orbits: The visualized paranasal sinuses are  essentially clear. Other:  The mastoid air cells are unopacified. CT CERVICAL SPINE FINDINGS Alignment: Mild straightening of the cervical spine. No facet subluxation. Dens is well positioned between the lateral masses  of C1. Minimal 2 mm anterolisthesis at C3-4, C4-5 and C7-T1. Skull base and vertebrae: No acute fracture. No primary bone lesion or focal pathologic process. Soft tissues and spinal canal: No prevertebral edema. No visible canal hematoma. Disc levels: Marked multilevel cervical degenerative disc disease, most prominent at C5-6. Advanced bilateral facet arthropathy. Moderate bilateral degenerative foraminal stenosis at C3-4. Severe degenerative foraminal stenosis on the right at C4-5. Mild to moderate degenerative foraminal stenosis on the left at C5-6 and C6-7. Upper chest: No acute abnormality. Other: Visualized mastoid air cells appear clear. No discrete thyroid nodules. No pathologically enlarged cervical nodes. IMPRESSION: 1. Loss of gray-white differentiation in the medial left occipital lobe with associated mild sulcal effacement, suspicious for acute/subacute nonhemorrhagic infarct. 2. No acute intracranial hemorrhage. 3. Generalized cerebral volume loss and prominent chronic small vessel ischemic changes in the cerebral white matter. 4. No cervical spine fracture or facet subluxation. 5. Advanced degenerative changes throughout the cervical spine as detailed. Critical Value/emergent results were called by telephone at the time of interpretation on 03/05/2019 at 6:52 pm to Dr. Alvira Monday , who verbally acknowledged these results. Electronically Signed   By: Delbert Phenix M.D.   On: 03/05/2019 18:55   Mr Angio Head Wo Contrast  Addendum Date: 03/06/2019   ADDENDUM REPORT: 03/06/2019 16:12 ADDENDUM: Study discussed by telephone with Dr. Rhona Leavens on 03/06/2019 at 1550 hours. Electronically Signed   By: Odessa Fleming M.D.   On: 03/06/2019 16:12   Result Date: 03/06/2019 CLINICAL DATA:  85 year old female  with confusion and evidence of acute to subacute left PCA infarct on head CT yesterday. EXAM: MRI HEAD WITHOUT CONTRAST MRA HEAD WITHOUT CONTRAST MRA NECK WITHOUT CONTRAST TECHNIQUE: Multiplanar, multiecho pulse sequences of the brain and surrounding structures were obtained without intravenous contrast. Angiographic images of the Circle of Willis were obtained using MRA technique without intravenous contrast. Angiographic images of the neck were obtained using MRA technique without intravenous contrast. Carotid stenosis measurements (when applicable) are obtained utilizing NASCET criteria, using the distal internal carotid diameter as the denominator. COMPARISON:  Head CT without contrast 03/05/2019. FINDINGS: Study is intermittently degraded by motion artifact despite repeated imaging attempts. MRI HEAD FINDINGS Brain: Confluent restricted diffusion in the left PCA territory corresponding to the hypodense area on CT yesterday. Associated hemorrhage on SWI (series 8, image 31) but no significant mass effect. A portion of the dorsal left thalamus is affected. Cytotoxic edema on T2 and FLAIR. No other restricted diffusion. No midline shift, mass effect, evidence of mass lesion, ventriculomegaly, extra-axial collection or acute intracranial hemorrhage. Cervicomedullary junction and pituitary are within normal limits. Confluent bilateral cerebral white matter T2 and FLAIR hyperintensity. Brainstem and cerebellum appear to remain normal for age. No chronic cerebral blood products identified. Vascular: Major intracranial vascular flow voids are preserved, see below. Skull and upper cervical spine: Cervical spine degraded by motion, grossly negative. Visible bone marrow signal appears normal. Sinuses/Orbits: Paranasal sinuses are clear. Orbits appear stable and negative. Other: Mastoid air cells are clear. Scalp and face soft tissues appear negative. MRA NECK FINDINGS Motion degraded time-of-flight images. There is  antegrade flow in the bilateral cervical carotid and vertebral arteries to the skull base. Detail of both carotid bifurcations is degraded, but there is no convincing high-grade carotid stenosis in the neck. Likewise vertebral detail is degraded. The left vertebral artery appears dominant. MRA HEAD FINDINGS Antegrade flow in the posterior circulation. Dominant left vertebral artery. No V4 segment stenosis. Both PICA appear patent. Patent vertebrobasilar junction  and basilar artery without stenosis. Patent SCA and PCA origins. There is no proximal left PCA occlusion, the P1 and P2 segments are patent. Distal PCA branch detail is limited. Antegrade flow in both ICA siphons with patent carotid termini. Both siphons appear irregular, but no hemodynamically significant stenosis is evident. The MCA and ACA origins are normal. The anterior communicating artery and visible ACA branches are within normal limits. Both MCA M1 segments and bifurcations are patent. No MCA branch occlusion is identified. IMPRESSION: 1. Acute to subacute confluent Left PCA territory infarct with petechial hemorrhage, but no malignant hemorrhagic transformation or significant mass effect. 2. No other acute intracranial abnormality. 3. Motion degraded head and neck MRA are negative for large vessel occlusion. And no intracranial hemodynamically significant stenosis is identified. Electronically Signed: By: Odessa Fleming M.D. On: 03/06/2019 15:31   Mr Angio Neck Wo Contrast  Addendum Date: 03/06/2019   ADDENDUM REPORT: 03/06/2019 16:12 ADDENDUM: Study discussed by telephone with Dr. Rhona Leavens on 03/06/2019 at 1550 hours. Electronically Signed   By: Odessa Fleming M.D.   On: 03/06/2019 16:12   Result Date: 03/06/2019 CLINICAL DATA:  85 year old female with confusion and evidence of acute to subacute left PCA infarct on head CT yesterday. EXAM: MRI HEAD WITHOUT CONTRAST MRA HEAD WITHOUT CONTRAST MRA NECK WITHOUT CONTRAST TECHNIQUE: Multiplanar, multiecho pulse  sequences of the brain and surrounding structures were obtained without intravenous contrast. Angiographic images of the Circle of Willis were obtained using MRA technique without intravenous contrast. Angiographic images of the neck were obtained using MRA technique without intravenous contrast. Carotid stenosis measurements (when applicable) are obtained utilizing NASCET criteria, using the distal internal carotid diameter as the denominator. COMPARISON:  Head CT without contrast 03/05/2019. FINDINGS: Study is intermittently degraded by motion artifact despite repeated imaging attempts. MRI HEAD FINDINGS Brain: Confluent restricted diffusion in the left PCA territory corresponding to the hypodense area on CT yesterday. Associated hemorrhage on SWI (series 8, image 31) but no significant mass effect. A portion of the dorsal left thalamus is affected. Cytotoxic edema on T2 and FLAIR. No other restricted diffusion. No midline shift, mass effect, evidence of mass lesion, ventriculomegaly, extra-axial collection or acute intracranial hemorrhage. Cervicomedullary junction and pituitary are within normal limits. Confluent bilateral cerebral white matter T2 and FLAIR hyperintensity. Brainstem and cerebellum appear to remain normal for age. No chronic cerebral blood products identified. Vascular: Major intracranial vascular flow voids are preserved, see below. Skull and upper cervical spine: Cervical spine degraded by motion, grossly negative. Visible bone marrow signal appears normal. Sinuses/Orbits: Paranasal sinuses are clear. Orbits appear stable and negative. Other: Mastoid air cells are clear. Scalp and face soft tissues appear negative. MRA NECK FINDINGS Motion degraded time-of-flight images. There is antegrade flow in the bilateral cervical carotid and vertebral arteries to the skull base. Detail of both carotid bifurcations is degraded, but there is no convincing high-grade carotid stenosis in the neck. Likewise  vertebral detail is degraded. The left vertebral artery appears dominant. MRA HEAD FINDINGS Antegrade flow in the posterior circulation. Dominant left vertebral artery. No V4 segment stenosis. Both PICA appear patent. Patent vertebrobasilar junction and basilar artery without stenosis. Patent SCA and PCA origins. There is no proximal left PCA occlusion, the P1 and P2 segments are patent. Distal PCA branch detail is limited. Antegrade flow in both ICA siphons with patent carotid termini. Both siphons appear irregular, but no hemodynamically significant stenosis is evident. The MCA and ACA origins are normal. The anterior communicating artery and visible  ACA branches are within normal limits. Both MCA M1 segments and bifurcations are patent. No MCA branch occlusion is identified. IMPRESSION: 1. Acute to subacute confluent Left PCA territory infarct with petechial hemorrhage, but no malignant hemorrhagic transformation or significant mass effect. 2. No other acute intracranial abnormality. 3. Motion degraded head and neck MRA are negative for large vessel occlusion. And no intracranial hemodynamically significant stenosis is identified. Electronically Signed: By: Odessa FlemingH  Hall M.D. On: 03/06/2019 15:31   Mr Brain Wo Contrast  Addendum Date: 03/06/2019   ADDENDUM REPORT: 03/06/2019 16:12 ADDENDUM: Study discussed by telephone with Dr. Rhona Leavenshiu on 03/06/2019 at 1550 hours. Electronically Signed   By: Odessa FlemingH  Hall M.D.   On: 03/06/2019 16:12   Result Date: 03/06/2019 CLINICAL DATA:  85 year old female with confusion and evidence of acute to subacute left PCA infarct on head CT yesterday. EXAM: MRI HEAD WITHOUT CONTRAST MRA HEAD WITHOUT CONTRAST MRA NECK WITHOUT CONTRAST TECHNIQUE: Multiplanar, multiecho pulse sequences of the brain and surrounding structures were obtained without intravenous contrast. Angiographic images of the Circle of Willis were obtained using MRA technique without intravenous contrast. Angiographic images of the  neck were obtained using MRA technique without intravenous contrast. Carotid stenosis measurements (when applicable) are obtained utilizing NASCET criteria, using the distal internal carotid diameter as the denominator. COMPARISON:  Head CT without contrast 03/05/2019. FINDINGS: Study is intermittently degraded by motion artifact despite repeated imaging attempts. MRI HEAD FINDINGS Brain: Confluent restricted diffusion in the left PCA territory corresponding to the hypodense area on CT yesterday. Associated hemorrhage on SWI (series 8, image 31) but no significant mass effect. A portion of the dorsal left thalamus is affected. Cytotoxic edema on T2 and FLAIR. No other restricted diffusion. No midline shift, mass effect, evidence of mass lesion, ventriculomegaly, extra-axial collection or acute intracranial hemorrhage. Cervicomedullary junction and pituitary are within normal limits. Confluent bilateral cerebral white matter T2 and FLAIR hyperintensity. Brainstem and cerebellum appear to remain normal for age. No chronic cerebral blood products identified. Vascular: Major intracranial vascular flow voids are preserved, see below. Skull and upper cervical spine: Cervical spine degraded by motion, grossly negative. Visible bone marrow signal appears normal. Sinuses/Orbits: Paranasal sinuses are clear. Orbits appear stable and negative. Other: Mastoid air cells are clear. Scalp and face soft tissues appear negative. MRA NECK FINDINGS Motion degraded time-of-flight images. There is antegrade flow in the bilateral cervical carotid and vertebral arteries to the skull base. Detail of both carotid bifurcations is degraded, but there is no convincing high-grade carotid stenosis in the neck. Likewise vertebral detail is degraded. The left vertebral artery appears dominant. MRA HEAD FINDINGS Antegrade flow in the posterior circulation. Dominant left vertebral artery. No V4 segment stenosis. Both PICA appear patent. Patent  vertebrobasilar junction and basilar artery without stenosis. Patent SCA and PCA origins. There is no proximal left PCA occlusion, the P1 and P2 segments are patent. Distal PCA branch detail is limited. Antegrade flow in both ICA siphons with patent carotid termini. Both siphons appear irregular, but no hemodynamically significant stenosis is evident. The MCA and ACA origins are normal. The anterior communicating artery and visible ACA branches are within normal limits. Both MCA M1 segments and bifurcations are patent. No MCA branch occlusion is identified. IMPRESSION: 1. Acute to subacute confluent Left PCA territory infarct with petechial hemorrhage, but no malignant hemorrhagic transformation or significant mass effect. 2. No other acute intracranial abnormality. 3. Motion degraded head and neck MRA are negative for large vessel occlusion. And no intracranial hemodynamically  significant stenosis is identified. Electronically Signed: By: Odessa Fleming M.D. On: 03/06/2019 15:31   Dg Chest Portable 1 View  Result Date: 03/05/2019 CLINICAL DATA:  Fever EXAM: PORTABLE CHEST 1 VIEW COMPARISON:  10/11/2017 chest radiograph. FINDINGS: Stable cardiomediastinal silhouette with mild cardiomegaly. No pneumothorax. No pleural effusion. Suggestion of a mild patchy lingular opacity. No pulmonary edema. IMPRESSION: Suggestion of a mild patchy lingular opacity, cannot exclude pneumonia. Chest radiograph follow-up advised. Electronically Signed   By: Delbert Phenix M.D.   On: 03/05/2019 17:47    Scheduled Meds:  aspirin  300 mg Rectal Daily   Or   aspirin  325 mg Oral Daily   atorvastatin  40 mg Oral q1800   diltiazem  90 mg Oral Q6H   [START ON 03/08/2019] ferrous sulfate  325 mg Oral Q breakfast   sodium chloride flush  3 mL Intravenous Once   Continuous Infusions:  azithromycin 500 mg (03/06/19 2007)   cefTRIAXone (ROCEPHIN)  IV 1 g (03/06/19 2146)   dextrose 5 % and 0.9% NaCl Stopped (03/07/19 0335)    diltiazem (CARDIZEM) infusion Stopped (03/06/19 1412)   heparin 1,000 Units/hr (03/07/19 0347)     LOS: 2 days   Rickey Barbara, MD [facility] Pager On Amion  If 7PM-7AM, please contact night-coverage 03/07/2019, 4:17 PM

## 2019-03-07 NOTE — Progress Notes (Signed)
STROKE TEAM PROGRESS NOTE    SUBJECTIVE (INTERVAL HISTORY) Patient is sitting up comfortably in bed.  She has been started on Eliquis for anticoagulation for her A. fib..    OBJECTIVE Vitals:   03/07/19 0643 03/07/19 0858 03/07/19 1109 03/07/19 1519  BP: 113/73 (!) 120/59 (!) 103/55 (!) 101/55  Pulse: 81 (!) 59 (!) 57 71  Resp:  20 16 20   Temp:  (!) 97.2 F (36.2 C) 98 F (36.7 C) (!) 97.3 F (36.3 C)  TempSrc:  Axillary Oral Axillary  SpO2:  96% 95% 94%  Weight:      Height:        CBC:  Recent Labs  Lab 03/05/19 1720 03/06/19 0529 03/07/19 0252  WBC 8.1 8.6 6.4  NEUTROABS 6.7  --   --   HGB 10.5* 9.3* 8.6*  HCT 33.6* 28.3* 26.3*  MCV 84.4 80.9 82.7  PLT 228 209 676    Basic Metabolic Panel:  Recent Labs  Lab 03/06/19 0529 03/07/19 0445  NA 135 138  K 3.1* 3.8  CL 108 111  CO2 18* 17*  GLUCOSE 87 97  BUN 19 22  CREATININE 0.97 1.07*  CALCIUM 8.1* 8.0*  MG 1.6* 2.0    Lipid Panel:     Component Value Date/Time   CHOL 105 03/06/2019 0529   TRIG 74 03/06/2019 0529   HDL 41 03/06/2019 0529   CHOLHDL 2.6 03/06/2019 0529   VLDL 15 03/06/2019 0529   LDLCALC 49 03/06/2019 0529   HgbA1c:  Lab Results  Component Value Date   HGBA1C 5.2 03/06/2019   Urine Drug Screen: No results found for: LABOPIA, COCAINSCRNUR, LABBENZ, AMPHETMU, THCU, LABBARB  Alcohol Level No results found for: ETH  IMAGING  Ct Head Wo Contrast 03/05/2019 IMPRESSION:  1. Loss of gray-white differentiation in the medial left occipital lobe with associated mild sulcal effacement, suspicious for acute/subacute nonhemorrhagic infarct.  2. No acute intracranial hemorrhage.  3. Generalized cerebral volume loss and prominent chronic small vessel ischemic changes in the cerebral white matter.   Ct Cervical Spine Wo Contrast 03/05/2019 1. No cervical spine fracture or facet subluxation.  2. Advanced degenerative changes throughout the cervical spine as detailed.   Dg Chest Portable 1  View 03/05/2019 IMPRESSION:  Suggestion of a mild patchy lingular opacity, cannot exclude pneumonia. Chest radiograph follow-up advised.    MRI Brain WO Contrast -shows acute left PCA territory infarct with  petechial hemorrhage.  MRI brain no large vessel stenosis or occlusion. MRA Head and Neck -motion degraded but no large vessel stenosis or occlusion in the neck of the brain   Transthoracic Echocardiogram  03/06/2019 IMPRESSIONS  1. The left ventricle has normal systolic function with an ejection fraction of 60-65%. The cavity size was normal. There is mildly increased left ventricular wall thickness. Left ventricular diastolic function could not be evaluated. Elevated mean left atrial pressure.  2. The right ventricle has normal systolic function. The cavity was normal. Right ventricular systolic pressure is mildly elevated.  3. Left atrial size was severely dilated.  4. Right atrial size was severely dilated.  5. The mitral valve is abnormal. Mild thickening of the mitral valve leaflet. There is severe mitral annular calcification present.  6. The tricuspid valve is grossly normal. Tricuspid valve regurgitation is moderate-severe.  7. The aortic valve is tricuspid. Mild thickening of the aortic valve. Aortic valve regurgitation is mild by color flow Doppler. No stenosis of the aortic valve.  8. The inferior vena cava was  dilated in size with <50% respiratory variability.  9. Mild apical hypokinesis with overall preserved LV function; mild LVH; severe biatrial enlargement; milld AI; moderate to severe TR; RVSP mildly elevated.   EKG - atrial fibrillation - rapid ventricular response 119 BPM (See cardiology reading for complete details)    PHYSICAL EXAM Blood pressure (!) 101/55, pulse 71, temperature (!) 97.3 F (36.3 C), temperature source Axillary, resp. rate 20, height 5\' 2"  (1.575 m), weight 47.6 kg, SpO2 94 %. Pleasant elderly Caucasian lady not in distress. . Afebrile. Head is  nontraumatic. Neck is supple without bruit.    Cardiac exam no murmur or gallop. Lungs are clear to auscultation. Distal pulses are well felt.  Neurological Exam :  Awake alert oriented to place and person.  Diminished attention, registration and recall.  Speech is fluent without aphasia or apraxia or dysarthria.  Extraocular movements are full range without nystagmus.  Dense right homonymous hemianopsia.  Face is symmetric without weakness tongue midline.  Motor system exam symmetric upper and lower extremity strength no focal weakness.  Moves all 4 extremities well against gravity.  Diminished right hemibody touch pinprick sensation.  Mild sensory inattention on the right.  Plantars downgoing.  Gait not tested.     ASSESSMENT/PLAN [name] is a 85 y.o. female with history of hypertension, frequent UTI's, CAD with stent placement, tobacco use, baseline mental status variability, hyperlipidemia, COPD, and atrial fibrillation (not anticoagulated) presenting with frequent falls. She did not receive IV t-PA due to late presentation.     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   MRI head -left PCA infarct with some petechial hemorrhage   MRA H&N -no large vessel stenosis or occlusion but motion degraded   CTA H&N - not performed  Carotid Doppler - not performed. MRA H&N - pending  2D Echo  - EF 60 - 65%. No cardiac source of emboli identified. Moderate to severe TR.  Severe biatrial enlargement.  Sars Corona Virus 2  - negative  LDL - 49  HgbA1c - 5.2  UDS - not performed  VTE prophylaxis - IV Heparin   Diet  - Heart healthy with thin liquids.  aspirin 81 mg daily and clopidogrel 75 mg daily prior to  admission, now on aspirin 325 mg daily IV Heparin added  Patient counseled to be compliant with her antithrombotic medications  Ongoing aggressive stroke risk factor management  Therapy recommendations:  pending  Disposition:  Pending  Hypertension  Blood pressure somewhat low at times.  Permissive hypertension (OK if < 220/120) but gradually normalize in 5-7 days . Long-term BP goal normotensive   Hyperlipidemia  Lipid lowering medication PTA:  Lipitor 40 mg daily  LDL 45, goal < 70  Current lipid lowering medication:  Lipitor 40 mg daily  Continue statin at discharge   Other Stroke Risk Factors  Advanced age  Cigarette smoker - advised to stop smoking  Atrial fibrillation - not anticoagulated  CAD - s/p stent placement  Other Active Problems  Anemia - Hb - 10.5->9.3  Hypokalemia - 3.1 - supplemented  Creatinine - 1.45->0.97  Possible pneumonia by CXR - Zithromax / Rocephin   Hospital day # 2    She presented with confusion and increasing falls likely due  to subacute left PCA infarct from atrial fibrillation and not on anticoagulation.  Continue IV heparin but we will need to  switch her to Eliquis prior to discharge.  Discussed with Dr. Rhona Leavenshiu.  Follow-up as an outpatient in the stroke clinic.  Stroke team will sign off.   Delia HeadyPramod Sethi, MD Medical Director [facility] Pager: [phone] 03/07/2019 5:06 PM   To contact Stroke Continuity provider, please refer to [email or web address]. After hours, contact General Neurology

## 2019-03-08 LAB — GLUCOSE, CAPILLARY
Glucose-Capillary: 120 mg/dL — ABNORMAL HIGH (ref 70–99)
Glucose-Capillary: 121 mg/dL — ABNORMAL HIGH (ref 70–99)
Glucose-Capillary: 87 mg/dL (ref 70–99)
Glucose-Capillary: 89 mg/dL (ref 70–99)
Glucose-Capillary: 93 mg/dL (ref 70–99)
Glucose-Capillary: 94 mg/dL (ref 70–99)

## 2019-03-08 LAB — HEPARIN LEVEL (UNFRACTIONATED): Heparin Unfractionated: 0.61 IU/mL (ref 0.30–0.70)

## 2019-03-08 LAB — CBC
HCT: 30 % — ABNORMAL LOW (ref 36.0–46.0)
Hemoglobin: 9.6 g/dL — ABNORMAL LOW (ref 12.0–15.0)
MCH: 27 pg (ref 26.0–34.0)
MCHC: 32 g/dL (ref 30.0–36.0)
MCV: 84.5 fL (ref 80.0–100.0)
Platelets: 206 10*3/uL (ref 150–400)
RBC: 3.55 MIL/uL — ABNORMAL LOW (ref 3.87–5.11)
RDW: 25.4 % — ABNORMAL HIGH (ref 11.5–15.5)
WBC: 6 10*3/uL (ref 4.0–10.5)
nRBC: 0 % (ref 0.0–0.2)

## 2019-03-08 MED ORDER — DILTIAZEM HCL 30 MG PO TABS
30.0000 mg | ORAL_TABLET | Freq: Four times a day (QID) | ORAL | Status: DC
  Administered 2019-03-08: 30 mg via ORAL
  Filled 2019-03-08: qty 1

## 2019-03-08 MED ORDER — LABETALOL HCL 5 MG/ML IV SOLN
5.0000 mg | INTRAVENOUS | Status: DC | PRN
  Administered 2019-03-08: 5 mg via INTRAVENOUS
  Filled 2019-03-08: qty 4

## 2019-03-08 MED ORDER — DILTIAZEM HCL 30 MG PO TABS: 60.0000 mg | ORAL_TABLET | Freq: Four times a day (QID) | ORAL | Status: DC

## 2019-03-08 MED ORDER — DILTIAZEM HCL 30 MG PO TABS
60.0000 mg | ORAL_TABLET | Freq: Four times a day (QID) | ORAL | Status: DC
  Administered 2019-03-08 – 2019-03-09 (×2): 60 mg via ORAL
  Filled 2019-03-08 (×3): qty 2

## 2019-03-08 MED ORDER — DEXTROSE-NACL 5-0.9 % IV SOLN
INTRAVENOUS | Status: DC
  Administered 2019-03-08 – 2019-03-09 (×2): via INTRAVENOUS

## 2019-03-08 NOTE — Plan of Care (Signed)
  Problem: Education: Goal: Knowledge of disease or condition will improve Outcome: Progressing Goal: Knowledge of secondary prevention will improve Outcome: Progressing Goal: Knowledge of patient specific risk factors addressed and post discharge goals established will improve Outcome: Progressing Goal: Individualized Educational Video(s) Outcome: Progressing   Problem: Coping: Goal: Will verbalize positive feelings about self Outcome: Progressing Goal: Will identify appropriate support needs Outcome: Progressing   Problem: Health Behavior/Discharge Planning: Goal: Ability to manage health-related needs will improve Outcome: Progressing   Jevante Hollibaugh, BSN, RN 

## 2019-03-08 NOTE — Care Management Important Message (Signed)
Important Message  Patient Details  Name: Sherry [name] MRN: [id number] Date of Birth: 1928/10/24   Medicare Important Message Given:  Yes  Due to illness patient not able to sign.  Signed copy left at patient bedside.   [name] 03/08/2019, 12:55 PM

## 2019-03-08 NOTE — Progress Notes (Signed)
[facility] for Heparin Indication: atrial fibrillation, acute CVA  Heparin Dosing Weight: 47.6 kg  Labs: Recent Labs    03/05/19 1720 03/06/19 0529  03/07/19 0252 03/07/19 0445 03/07/19 1153 03/07/19 2051 03/08/19 0610  HGB 10.5* 9.3*  --  8.6*  --   --   --  9.6*  HCT 33.6* 28.3*  --  26.3*  --   --   --  30.0*  PLT 228 209  --  197  --   --   --  206  LABPROT 14.8  --   --   --   --   --   --   --   INR 1.2  --   --   --   --   --   --   --   HEPARINUNFRC  --  <0.10*   < > 0.26*  --  0.46 0.67 0.61  CREATININE 1.45* 0.97  --   --  1.07*  --   --   --   CKTOTAL 268*  --   --   --   --   --   --   --    < > = values in this interval not displayed.    Assessment: 54 yof with hx of afib not on anticoagulation PTA presenting with falls, afib with RVR, subacute ischemic stroke. Pharmacy consulted to dose heparin per stroke protocol.   Heparin level 0.61  Goal of Therapy:  Heparin level 0.3-0.5 units/ml, no boluses Monitor platelets by anticoagulation protocol: Yes   Plan:  Decrease heparin to 850 units / hr Daily heparin level and CBC  Thank you Anette Guarneri, PharmD 03/08/2019, 12:51 PM

## 2019-03-08 NOTE — Progress Notes (Signed)
STROKE TEAM PROGRESS NOTE    SUBJECTIVE (INTERVAL HISTORY) Patient is sitting up comfortably in bed.  She has been started on Eliquis for anticoagulation for her A. fib..    OBJECTIVE Vitals:   03/08/19 0447 03/08/19 0553 03/08/19 0809 03/08/19 1148  BP: (!) 144/49 (!) 139/52 102/81 (!) 148/65  Pulse:  (!) 53 (!) 47 (!) 47  Resp:   17 18  Temp:   97.8 F (36.6 C) 98.2 F (36.8 C)  TempSrc:   Oral Oral  SpO2:   94% 95%  Weight:      Height:        CBC:  Recent Labs  Lab 03/05/19 1720  03/07/19 0252 03/08/19 0610  WBC 8.1   < > 6.4 6.0  NEUTROABS 6.7  --   --   --   HGB 10.5*   < > 8.6* 9.6*  HCT 33.6*   < > 26.3* 30.0*  MCV 84.4   < > 82.7 84.5  PLT 228   < > 197 206   < > = values in this interval not displayed.    Basic Metabolic Panel:  Recent Labs  Lab 03/06/19 0529 03/07/19 0445  NA 135 138  K 3.1* 3.8  CL 108 111  CO2 18* 17*  GLUCOSE 87 97  BUN 19 22  CREATININE 0.97 1.07*  CALCIUM 8.1* 8.0*  MG 1.6* 2.0    Lipid Panel:     Component Value Date/Time   CHOL 105 03/06/2019 0529   TRIG 74 03/06/2019 0529   HDL 41 03/06/2019 0529   CHOLHDL 2.6 03/06/2019 0529   VLDL 15 03/06/2019 0529   LDLCALC 49 03/06/2019 0529   HgbA1c:  Lab Results  Component Value Date   HGBA1C 5.2 03/06/2019   Urine Drug Screen: No results found for: LABOPIA, COCAINSCRNUR, LABBENZ, AMPHETMU, THCU, LABBARB  Alcohol Level No results found for: ETH  IMAGING  Ct Head Wo Contrast 03/05/2019 IMPRESSION:  1. Loss of gray-white differentiation in the medial left occipital lobe with associated mild sulcal effacement, suspicious for acute/subacute nonhemorrhagic infarct.  2. No acute intracranial hemorrhage.  3. Generalized cerebral volume loss and prominent chronic small vessel ischemic changes in the cerebral white matter.   Ct Cervical Spine Wo Contrast 03/05/2019 1. No cervical spine fracture or facet subluxation.  2. Advanced degenerative changes throughout the  cervical spine as detailed.   Dg Chest Portable 1 View 03/05/2019 IMPRESSION:  Suggestion of a mild patchy lingular opacity, cannot exclude pneumonia. Chest radiograph follow-up advised.    MRI Brain WO Contrast -shows acute left PCA territory infarct with  petechial hemorrhage.  MRI brain no large vessel stenosis or occlusion. MRA Head and Neck -motion degraded but no large vessel stenosis or occlusion in the neck of the brain   Transthoracic Echocardiogram  03/06/2019 IMPRESSIONS  1. The left ventricle has normal systolic function with an ejection fraction of 60-65%. The cavity size was normal. There is mildly increased left ventricular wall thickness. Left ventricular diastolic function could not be evaluated. Elevated mean left atrial pressure.  2. The right ventricle has normal systolic function. The cavity was normal. Right ventricular systolic pressure is mildly elevated.  3. Left atrial size was severely dilated.  4. Right atrial size was severely dilated.  5. The mitral valve is abnormal. Mild thickening of the mitral valve leaflet. There is severe mitral annular calcification present.  6. The tricuspid valve is grossly normal. Tricuspid valve regurgitation is moderate-severe.  7. The aortic  valve is tricuspid. Mild thickening of the aortic valve. Aortic valve regurgitation is mild by color flow Doppler. No stenosis of the aortic valve.  8. The inferior vena cava was dilated in size with <50% respiratory variability.  9. Mild apical hypokinesis with overall preserved LV function; mild LVH; severe biatrial enlargement; milld AI; moderate to severe TR; RVSP mildly elevated.   EKG - atrial fibrillation - rapid ventricular response 119 BPM (See cardiology reading for complete details)    PHYSICAL EXAM Blood pressure (!) 148/65, pulse (!) 47, temperature 98.2 F (36.8 C), temperature source Oral, resp. rate 18, height 5\' 2"  (1.575 m), weight 47.6 kg, SpO2 95 %. Pleasant elderly  Caucasian lady not in distress. . Afebrile. Head is nontraumatic. Neck is supple without bruit.    Cardiac exam no murmur or gallop. Lungs are clear to auscultation. Distal pulses are well felt.  Neurological Exam :  Awake alert oriented to place and person.  Diminished attention, registration and recall.  Speech is fluent without aphasia or apraxia or dysarthria.  Extraocular movements are full range without nystagmus.  Dense right homonymous hemianopsia.  Face is symmetric without weakness tongue midline.  Motor system exam symmetric upper and lower extremity strength no focal weakness.  Moves all 4 extremities well against gravity.  Diminished right hemibody touch pinprick sensation.  Mild sensory inattention on the right.  Plantars downgoing.  Gait not tested.     ASSESSMENT/PLAN Sherry [name] is a 85 y.o. female with history of hypertension, frequent UTI's, CAD with stent placement, tobacco use, baseline mental status variability, hyperlipidemia, COPD, and atrial fibrillation (not anticoagulated) presenting with frequent falls. She did not receive IV t-PA due to late presentation.     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   MRI head -left PCA infarct with some petechial hemorrhage   MRA H&N -no large vessel stenosis or occlusion but motion degraded   CTA H&N - not performed  Carotid Doppler - not performed. MRA H&N - pending  2D Echo  - EF 60 - 65%. No cardiac source of emboli identified. Moderate to severe TR.  Severe biatrial enlargement.  Sars Corona Virus 2  - negative  LDL - 49  HgbA1c - 5.2  UDS - not performed  VTE prophylaxis - IV Heparin   Diet  - Heart healthy with thin liquids.  aspirin 81  mg daily and clopidogrel 75 mg daily prior to admission, now on aspirin 325 mg daily IV Heparin added  Patient counseled to be compliant with her antithrombotic medications  Ongoing aggressive stroke risk factor management  Therapy recommendations:  snf  Disposition:  Pending  Hypertension  Blood pressure somewhat low at times.  Permissive hypertension (OK if < 220/120) but gradually normalize in 5-7 days . Long-term BP goal normotensive   Hyperlipidemia  Lipid lowering medication PTA:  Lipitor 40 mg daily  LDL 45, goal < 70  Current lipid lowering medication:  Lipitor 40 mg daily  Continue statin at discharge   Other Stroke Risk Factors  Advanced age  Cigarette smoker - advised to stop smoking  Atrial fibrillation - not anticoagulated  CAD - s/p stent placement  Other Active Problems  Anemia - Hb - 10.5->9.3  Hypokalemia - 3.1 - supplemented  Creatinine - 1.45->0.97  Possible pneumonia by CXR - Zithromax / Rocephin   Hospital day # 3    She presented with confusion and increasing falls likely due to subacute left PCA infarct from atrial fibrillation and not on anticoagulation.  Continue IV heparin but we will need to  switch her to Eliquis prior to discharge.  Discussed with Dr. Rhona Leavenshiu.  Follow-up as an outpatient in the stroke clinic.  Stroke team will sign off.   Delia HeadyPramod Rosalind Guido, MD Medical Director [facility] Pager: [phone] 03/08/2019 3:49 PM   To contact Stroke Continuity provider, please refer to [email or web address]. After hours, contact General Neurology

## 2019-03-08 NOTE — Progress Notes (Signed)
Physical Therapy Treatment Patient Details Name: Sherry [name] MRN: [id number] DOB: 29-Aug-1929 Today's Date: 03/08/2019    History of Present Illness Sherry [name] is a 85 y.o. female presenting to the hospital via EMS for evaluation of worsening falls. with medical history significant of COPD, hypertension, hyperlipidemia, CAD status post stent, A. fib not on anticoagulation. Negative CT head. C-Collar in room     PT Comments    Patient seen for mobility progression. Continue to progress as tolerated with anticipated d/c to SNF for further skilled PT services.     Follow Up Recommendations  SNF;Supervision/Assistance - 24 hour     Equipment Recommendations  None recommended by PT    Recommendations for Other Services       Precautions / Restrictions Precautions Precautions: Fall Precaution Comments: has c-collar in room, but negative for C spine fxs Restrictions Weight Bearing Restrictions: No    Mobility  Bed Mobility Overal bed mobility: Needs Assistance Bed Mobility: Supine to Sit     Supine to sit: Min assist     General bed mobility comments: assistance required to elevate trunk into sitting   Transfers Overall transfer level: Needs assistance Equipment used: Rolling walker (2 wheeled) Transfers: Sit to/from Stand Sit to Stand: Mod assist         General transfer comment: assistance required to power up into standing and to gain balance upon standing; cues for safe hadn placement   Ambulation/Gait Ambulation/Gait assistance: Min assist Gait Distance (Feet): 20 Feet Assistive device: Rolling walker (2 wheeled) Gait Pattern/deviations: Decreased stride length;Trunk flexed Gait velocity: decreased   General Gait Details: assist to steady and manage RW safely; cues for use of AD and upright posture   Stairs             Wheelchair Mobility    Modified Rankin (Stroke Patients Only) Modified Rankin (Stroke Patients Only) Pre-Morbid Rankin Score:  Moderate disability Modified Rankin: Moderately severe disability     Balance Overall balance assessment: Needs assistance Sitting-balance support: Bilateral upper extremity supported;Feet supported Sitting balance-Leahy Scale: Poor     Standing balance support: Bilateral upper extremity supported;During functional activity Standing balance-Leahy Scale: Poor                              Cognition Arousal/Alertness: Awake/alert Behavior During Therapy: WFL for tasks assessed/performed Overall Cognitive Status: History of cognitive impairments - at baseline                                        Exercises      General Comments        Pertinent Vitals/Pain Pain Assessment: Faces Faces Pain Scale: Hurts a little bit Pain Location: "back of my head" and hips  Pain Descriptors / Indicators: Aching;Sore Pain Intervention(s): Limited activity within patient's tolerance;Monitored during session;Repositioned    Home Living                      Prior Function            PT Goals (current goals can now be found in the care plan section) Acute Rehab PT Goals Patient Stated Goal: to go home Progress towards PT goals: Progressing toward goals    Frequency    Min 4X/week      PT Plan Current plan remains appropriate  Co-evaluation              AM-PAC PT "6 Clicks" Mobility   Outcome Measure  Help needed turning from your back to your side while in a flat bed without using bedrails?: A Little Help needed moving from lying on your back to sitting on the side of a flat bed without using bedrails?: A Little Help needed moving to and from a bed to a chair (including a wheelchair)?: A Little Help needed standing up from a chair using your arms (e.g., wheelchair or bedside chair)?: A Lot Help needed to walk in hospital room?: A Lot Help needed climbing 3-5 steps with a railing? : A Lot 6 Click Score: 15    End of Session  Equipment Utilized During Treatment: Gait belt Activity Tolerance: Patient tolerated treatment well Patient left: in chair;with call bell/phone within reach;with chair alarm set Nurse Communication: Mobility status PT Visit Diagnosis: Other abnormalities of gait and mobility (R26.89);Difficulty in walking, not elsewhere classified (R26.2)     Time: [id number] PT Time Calculation (min) (ACUTE ONLY): 34 min  Charges:  $Gait Training: 8-22 mins $Therapeutic Activity: 8-22 mins                     Erline LevineKellyn Makalyn Lennox, PTA Acute Rehabilitation Services Pager: [phone] Office: [phone]     Carolynne EdouardKellyn R Urho Rio 03/08/2019, 4:44 PM

## 2019-03-08 NOTE — Progress Notes (Signed)
PROGRESS NOTE    Sherry [name]  ZOX:[id number] DOB: June 05, 1929 DOA: 03/05/2019 PCP: Darrow BussingKoirala, Dibas, MD    Brief Narrative:  85 y.o. female with medical history significant of COPD, hypertension, hyperlipidemia, CAD status post stent, A. fib not on anticoagulation presenting to the hospital via EMS for evaluation of worsening falls.  Daughter reported that patient has been having a fall almost every day for the past 2 weeks.  She had a fall 2 weeks ago and hit her head as she fell from a chair and bruised her head and eye.  She has continued to have falls every day.  She has been having worsening memory for the past few months to years.  She has been using a walker to ambulate after hip replacement surgery 5 years ago.  She requires some assistance with cooking but is able to bathe herself and take care of some of the other ADLs with minimal assistance at home.  At baseline, she is able to answer orientation questions correctly only at times.  She has had UTIs which have been treated.  Patient knows her name and that she is at a hospital.  Appears confused and not able to provide much history.  She has no complaints.  Denies any chest pain, shortness of breath, abdominal pain, or dysuria.  No other complaints.  ED Course: A. fib with RVR with rate up to 130s.  Not hypotensive.  Not febrile.  Not hypoxic.  No leukocytosis.  Lactic acid normal.  Hemoglobin 10.5, was 13.1 a year ago.  CBG 69 upon arrival to the ED.  Bicarb 17.  Anion gap 12.  BUN 28 creatinine 1.4.  Creatinine was 0.8 a year ago.  No significant LFT abnormalities.  CK not significantly elevated.  UA with negative nitrite, small amount of leukocytes, 0-5 WBCs, and many bacteria. Head CT with findings suspicious for acute/subacute nonhemorrhagic infarct in the medial left occipital lobe.  CT C-spine negative for acute finding. Patient received ceftriaxone, azithromycin, and 1 L normal saline bolus in the ED. Dr. Wilford CornerArora from neurology has been  consulted  Assessment & Plan:   Principal Problem:   Acute CVA (cerebrovascular accident) [facility](HCC) Active Problems:   Atrial fibrillation with rapid ventricular response (HCC)   CAP (community acquired pneumonia)   UTI (urinary tract infection)   Hypoglycemia  Subacute ischemic stroke, likely cardioembolic -Head CT with findings suspicious for acute/subacute nonhemorrhagic infarct in the medial left occipital lobe.  Noted to be in A. fib with RVR.  -Neurology consulted, appreciate recommendations -MRI of the brain and MRA neck reviewed. Findings of acute/subacute confluent L PCA territory infarct with petechial hemorrhage, otherwise neg for large vessel occlusion -2D echocardiogram performed and reviewed, normal LVEF -Hemoglobin A1c of 5.2 -LDL 49 -Continued on Aspirin 325 p.o. now and daily -Continue home Lipitor 40 mg daily -PT, OT, speech therapy. -now on heparin given below afib. Discussed case with on-call Cardiology. Given high risk for future stroke, recommendation for 2.5mg  eliquis bid for long term anticoagulation as benefit to anticoagulation would outweigh risk. -Discussed with neurology, plan to start oral anticoagulation at time of discharge -Pending snf placement at this time  Possible community-acquired pneumonia Afebrile and no leukocytosis.  No lactic acidosis.  Not hypoxic.  No signs of sepsis. Chest x-ray with mild patchy lingular opacity, cannot exclude pneumonia.  -Continued on ceftriaxone and azithromycin as tolerated -Afebrile at this time  -Anticipate final day of abx on 7/7  Possible UTI  UA with negative nitrite,  small amount of leukocytes, 0-5 WBCs, and many bacteria.  Afebrile and no leukocytosis.  No lactic acidosis.  No signs of sepsis. -Ceftriaxone started -Urine culture remains pending. Blood cx thus far neg -Anticipate completing abx on 7/7  A. fib with RVR Rate up to 130s. CHA2DS2VASc now 6.  Not on anticoagulation at home.  Precipitating  factor likely not being able to take her home Cardizem versus underlying infection (pneumonia +/- UTI). -HR improved with cardizem gtt, now on PO cardizem -Heparin infusion for now. Pending PT/OT eval to determine fall risk, however given markedly high risk for future stroke, pt would likely benefit from anticoagulation. See above. Case was discussed with on-call Cardiologist. At this time, benefit to anticoagulation would outweigh risk -Plan eliquis near time of d/c -Pt bradycardic this AM. Will decrease cardizem to  from   Hypoglycemia Likely secondary to decreased p.o. intake.  Hypoglycemic in the ED with CBG as low as 63.  Received D50. -Currently on d5 fluids, will wean to off  Altered mental status, frequent falls -Fall precautions -Continue to monitor for mental status changes -Conversing appropriately this AM  Normocytic anemia Hemoglobin 10.5, was 13.1 a year ago.  No signs of active bleeding. -Hemodynamically stable at this time -Iron level low at 15. -Pt s/p fereheme infusion  AKI BUN 28 creatinine 1.4.  Creatinine was 0.8 a year ago.  Likely prerenal secondary to dehydration. -continued on IV fluid hydration -Avoid nephrotoxic agents -Repeat bmet in AM  Normal anion gap metabolic acidosis Bicarb 17, anion gap 12.  Possibly related to AKI. -improved with IV fluid hydration per above -Recheck bmet in AM  DVT prophylaxis: Heparin gtt Code Status: Full Family Communication: Pt in room, family not at bedside Disposition Plan: SNF possible on 7/8  Consultants:   Neurology  Discussed case with On-call Cardiologist  Procedures:     Antimicrobials: Anti-infectives (From admission, onward)   Start     Dose/Rate Route Frequency Ordered Stop   03/06/19 1900  cefTRIAXone (ROCEPHIN) 1 g in sodium chloride 0.9 % 100 mL IVPB     1 g 200 mL/hr over 30 Minutes Intravenous Every 24 hours 03/05/19 1944     03/06/19 1900  azithromycin (ZITHROMAX) 500 mg in  sodium chloride 0.9 % 250 mL IVPB     500 mg 250 mL/hr over 60 Minutes Intravenous Every 24 hours 03/05/19 1944     03/05/19 1945  cefTRIAXone (ROCEPHIN) 1 g in sodium chloride 0.9 % 100 mL IVPB  Status:  Discontinued     1 g 200 mL/hr over 30 Minutes Intravenous  Once 03/05/19 1944 03/05/19 1947   03/05/19 1800  cefTRIAXone (ROCEPHIN) 1 g in sodium chloride 0.9 % 100 mL IVPB     1 g 200 mL/hr over 30 Minutes Intravenous  Once 03/05/19 1759 03/05/19 2005   03/05/19 1800  azithromycin (ZITHROMAX) 500 mg in sodium chloride 0.9 % 250 mL IVPB     500 mg 250 mL/hr over 60 Minutes Intravenous  Once 03/05/19 1759 03/05/19 2005      Subjective: Without complaints this AM  Objective: Vitals:   03/08/19 0447 03/08/19 0553 03/08/19 0809 03/08/19 1148  BP: (!) 144/49 (!) 139/52 102/81 (!) 148/65  Pulse:  (!) 53 (!) 47 (!) 47  Resp:   17 18  Temp:   97.8 F (36.6 C) 98.2 F (36.8 C)  TempSrc:   Oral Oral  SpO2:   94% 95%  Weight:      Height:  Intake/Output Summary (Last 24 hours) at 03/08/2019 1417 Last data filed at 03/08/2019 0310 Gross per 24 hour  Intake 1415.31 ml  Output --  Net 1415.31 ml   Filed Weights   03/05/19 2000  Weight: 47.6 kg    Examination: General exam: Conversant, in no acute distress Respiratory system: normal chest rise, clear, no audible wheezing Cardiovascular system: bradycardic, s1-s2 Gastrointestinal system: Nondistended, nontender, pos BS Central nervous system: No seizures, no tremors Extremities: No cyanosis, no joint deformities Skin: No rashes, no pallor Psychiatry: Affect normal // no auditory hallucinations   Data Reviewed: I have personally reviewed following labs and imaging studies  CBC: Recent Labs  Lab 03/05/19 1720 03/06/19 0529 03/07/19 0252 03/08/19 0610  WBC 8.1 8.6 6.4 6.0  NEUTROABS 6.7  --   --   --   HGB 10.5* 9.3* 8.6* 9.6*  HCT 33.6* 28.3* 26.3* 30.0*  MCV 84.4 80.9 82.7 84.5  PLT 228 209 197 206   Basic  Metabolic Panel: Recent Labs  Lab 03/05/19 1720 03/06/19 0529 03/07/19 0445  NA 134* 135 138  K 3.5 3.1* 3.8  CL 105 108 111  CO2 17* 18* 17*  GLUCOSE 69* 87 97  BUN 28* 19 22  CREATININE 1.45* 0.97 1.07*  CALCIUM 8.7* 8.1* 8.0*  MG  --  1.6* 2.0   GFR: Estimated Creatinine Clearance: 27.8 mL/min (A) (by C-G formula based on SCr of 1.07 mg/dL (H)). Liver Function Tests: Recent Labs  Lab 03/05/19 1720  AST 43*  ALT 16  ALKPHOS 58  BILITOT 0.9  PROT 6.0*  ALBUMIN 3.2*   No results for input(s): LIPASE, AMYLASE in the last 168 hours. No results for input(s): AMMONIA in the last 168 hours. Coagulation Profile: Recent Labs  Lab 03/05/19 1720  INR 1.2   Cardiac Enzymes: Recent Labs  Lab 03/05/19 1720  CKTOTAL 268*   BNP (last 3 results) No results for input(s): PROBNP in the last 8760 hours. HbA1C: Recent Labs    03/06/19 0529  HGBA1C 5.2   CBG: Recent Labs  Lab 03/07/19 2025 03/08/19 0024 03/08/19 0353 03/08/19 0813 03/08/19 1144  GLUCAP 109* 94 93 89 87   Lipid Profile: Recent Labs    03/06/19 0529  CHOL 105  HDL 41  LDLCALC 49  TRIG 74  CHOLHDL 2.6   Thyroid Function Tests: No results for input(s): TSH, T4TOTAL, FREET4, T3FREE, THYROIDAB in the last 72 hours. Anemia Panel: Recent Labs    03/06/19 0529  VITAMINB12 471  FOLATE 9.8  FERRITIN 33  TIBC 321  IRON 15*  RETICCTPCT 1.1   Sepsis Labs: Recent Labs  Lab 03/05/19 1720  PROCALCITON 0.35  LATICACIDVEN 1.2    Recent Results (from the past 240 hour(s))  Culture, blood (Routine x 2)     Status: None (Preliminary result)   Collection Time: 03/05/19  5:15 PM   Specimen: BLOOD  Result Value Ref Range Status   Specimen Description BLOOD RIGHT ANTECUBITAL  Final   Special Requests   Final    BOTTLES DRAWN AEROBIC AND ANAEROBIC Blood Culture results may not be optimal due to an inadequate volume of blood received in culture bottles   Culture   Final    NO GROWTH 3  DAYS Performed at [facility], [address] [place], [place] [address]    Report Status PENDING  Incomplete  Culture, blood (Routine x 2)     Status: None (Preliminary result)   Collection Time: 03/05/19  5:20  PM   Specimen: BLOOD LEFT FOREARM  Result Value Ref Range Status   Specimen Description BLOOD LEFT FOREARM  Final   Special Requests   Final    BOTTLES DRAWN AEROBIC AND ANAEROBIC Blood Culture results may not be optimal due to an inadequate volume of blood received in culture bottles   Culture   Final    NO GROWTH 3 DAYS Performed at [facility], [place] [address] [place], [place] [address]    Report Status PENDING  Incomplete  Urine culture     Status: None   Collection Time: 03/05/19  5:20 PM   Specimen: Urine, Random  Result Value Ref Range Status   Specimen Description URINE, RANDOM  Final   Special Requests   Final    NONE Performed at [facility], [place] [address] [place], [place] [address]    Culture   Final    Multiple bacterial morphotypes present, none predominant. Suggest appropriate recollection if clinically indicated.   Report Status 03/07/2019 FINAL  Final  SARS Coronavirus 2 (CEPHEID - Performed in [place] hospital lab), Hosp Order     Status: None   Collection Time: 03/05/19  8:14 PM   Specimen: Nasopharyngeal Swab  Result Value Ref Range Status   SARS Coronavirus 2 NEGATIVE NEGATIVE Final    Comment: (NOTE) If result is NEGATIVE SARS-CoV-2 target nucleic acids are NOT DETECTED. The SARS-CoV-2 RNA is generally detectable in upper and lower  respiratory specimens during the acute phase of infection. The lowest  concentration of SARS-CoV-2 viral copies this assay can detect is 250  copies / mL. A negative result does not preclude SARS-CoV-2 infection  and should not be used as the sole basis for treatment or other  patient management decisions.  A negative result may occur with  improper specimen collection / handling,  submission of specimen other  than nasopharyngeal swab, presence of viral mutation(s) within the  areas targeted by this assay, and inadequate number of viral copies  (<250 copies / mL). A negative result must be combined with clinical  observations, patient history, and epidemiological information. If result is POSITIVE SARS-CoV-2 target nucleic acids are DETECTED. The SARS-CoV-2 RNA is generally detectable in upper and lower  respiratory specimens dur ing the acute phase of infection.  Positive  results are indicative of active infection with SARS-CoV-2.  Clinical  correlation with patient history and other diagnostic information is  necessary to determine patient infection status.  Positive results do  not rule out bacterial infection or co-infection with other viruses. If result is PRESUMPTIVE POSTIVE SARS-CoV-2 nucleic acids MAY BE PRESENT.   A presumptive positive result was obtained on the submitted specimen  and confirmed on repeat testing.  While 2019 novel coronavirus  (SARS-CoV-2) nucleic acids may be present in the submitted sample  additional confirmatory testing may be necessary for epidemiological  and / or clinical management purposes  to differentiate between  SARS-CoV-2 and other Sarbecovirus currently known to infect humans.  If clinically indicated additional testing with an alternate test  methodology [phone]) is advised. The SARS-CoV-2 RNA is generally  detectable in upper and lower respiratory sp ecimens during the acute  phase of infection. The expected result is Negative. Fact Sheet for Patients:  StrictlyIdeas.no Fact Sheet for Healthcare Providers: BankingDealers.co.za This test is not yet approved or cleared by the [place] FDA and has been authorized for detection and/or diagnosis of SARS-CoV-2 by FDA under an Emergency Use Authorization (EUA).  This EUA will remain in effect (meaning this test can be  used) for the duration of the COVID-19 declaration under Section 564(b)(1) of the Act, 21 U.S.C. section 360bbb-3(b)(1), unless the authorization is terminated or revoked sooner. Performed at [facility], [address], [place], [place] [address]      Radiology Studies: Mr Angio Head Wo Contrast  Addendum Date: 03/06/2019   ADDENDUM REPORT: 03/06/2019 16:12 ADDENDUM: Study discussed by telephone with Dr. Rhona Leavens on 03/06/2019 at 1550 hours. Electronically Signed   By: Odessa Fleming M.D.   On: 03/06/2019 16:12   Result Date: 03/06/2019 CLINICAL DATA:  85 year old female with confusion and evidence of acute to subacute left PCA infarct on head CT yesterday. EXAM: MRI HEAD WITHOUT CONTRAST MRA HEAD WITHOUT CONTRAST MRA NECK WITHOUT CONTRAST TECHNIQUE: Multiplanar, multiecho pulse sequences of the brain and surrounding structures were obtained without intravenous contrast. Angiographic images of the Circle of Willis were obtained using MRA technique without intravenous contrast. Angiographic images of the neck were obtained using MRA technique without intravenous contrast. Carotid stenosis measurements (when applicable) are obtained utilizing NASCET criteria, using the distal internal carotid diameter as the denominator. COMPARISON:  Head CT without contrast 03/05/2019. FINDINGS: Study is intermittently degraded by motion artifact despite repeated imaging attempts. MRI HEAD FINDINGS Brain: Confluent restricted diffusion in the left PCA territory corresponding to the hypodense area on CT yesterday. Associated hemorrhage on SWI (series 8, image 31) but no significant mass effect. A portion of the dorsal left thalamus is affected. Cytotoxic edema on T2 and FLAIR. No other restricted diffusion. No midline shift, mass effect, evidence of mass lesion, ventriculomegaly, extra-axial collection or acute intracranial hemorrhage. Cervicomedullary junction and pituitary are within normal limits. Confluent bilateral  cerebral white matter T2 and FLAIR hyperintensity. Brainstem and cerebellum appear to remain normal for age. No chronic cerebral blood products identified. Vascular: Major intracranial vascular flow voids are preserved, see below. Skull and upper cervical spine: Cervical spine degraded by motion, grossly negative. Visible bone marrow signal appears normal. Sinuses/Orbits: Paranasal sinuses are clear. Orbits appear stable and negative. Other: Mastoid air cells are clear. Scalp and face soft tissues appear negative. MRA NECK FINDINGS Motion degraded time-of-flight images. There is antegrade flow in the bilateral cervical carotid and vertebral arteries to the skull base. Detail of both carotid bifurcations is degraded, but there is no convincing high-grade carotid stenosis in the neck. Likewise vertebral detail is degraded. The left vertebral artery appears dominant. MRA HEAD FINDINGS Antegrade flow in the posterior circulation. Dominant left vertebral artery. No V4 segment stenosis. Both PICA appear patent. Patent vertebrobasilar junction and basilar artery without stenosis. Patent SCA and PCA origins. There is no proximal left PCA occlusion, the P1 and P2 segments are patent. Distal PCA branch detail is limited. Antegrade flow in both ICA siphons with patent carotid termini. Both siphons appear irregular, but no hemodynamically significant stenosis is evident. The MCA and ACA origins are normal. The anterior communicating artery and visible ACA branches are within normal limits. Both MCA M1 segments and bifurcations are patent. No MCA branch occlusion is identified. IMPRESSION: 1. Acute to subacute confluent Left PCA territory infarct with petechial hemorrhage, but no malignant hemorrhagic transformation or significant mass effect. 2. No other acute intracranial abnormality. 3. Motion degraded head and neck MRA are negative for large vessel occlusion. And no intracranial hemodynamically significant stenosis is  identified. Electronically Signed: By: Odessa Fleming M.D. On: 03/06/2019 15:31   Mr Angio Neck Wo Contrast  Addendum Date: 03/06/2019   ADDENDUM REPORT: 03/06/2019 16:12 ADDENDUM: Study discussed by telephone with Dr. Rhona Leavenshiu on 03/06/2019 at 1550 hours. Electronically Signed   By: Odessa FlemingH  Hall M.D.   On: 03/06/2019 16:12   Result Date: 03/06/2019 CLINICAL DATA:  85 year old female with confusion and evidence of acute to subacute left PCA infarct on head CT yesterday. EXAM: MRI HEAD WITHOUT CONTRAST MRA HEAD WITHOUT CONTRAST MRA NECK WITHOUT CONTRAST TECHNIQUE: Multiplanar, multiecho pulse sequences of the brain and surrounding structures were obtained without intravenous contrast. Angiographic images of the Circle of Willis were obtained using MRA technique without intravenous contrast. Angiographic images of the neck were obtained using MRA technique without intravenous contrast. Carotid stenosis measurements (when applicable) are obtained utilizing NASCET criteria, using the distal internal carotid diameter as the denominator. COMPARISON:  Head CT without contrast 03/05/2019. FINDINGS: Study is intermittently degraded by motion artifact despite repeated imaging attempts. MRI HEAD FINDINGS Brain: Confluent restricted diffusion in the left PCA territory corresponding to the hypodense area on CT yesterday. Associated hemorrhage on SWI (series 8, image 31) but no significant mass effect. A portion of the dorsal left thalamus is affected. Cytotoxic edema on T2 and FLAIR. No other restricted diffusion. No midline shift, mass effect, evidence of mass lesion, ventriculomegaly, extra-axial collection or acute intracranial hemorrhage. Cervicomedullary junction and pituitary are within normal limits. Confluent bilateral cerebral white matter T2 and FLAIR hyperintensity. Brainstem and cerebellum appear to remain normal for age. No chronic cerebral blood products identified. Vascular: Major intracranial vascular flow voids are preserved,  see below. Skull and upper cervical spine: Cervical spine degraded by motion, grossly negative. Visible bone marrow signal appears normal. Sinuses/Orbits: Paranasal sinuses are clear. Orbits appear stable and negative. Other: Mastoid air cells are clear. Scalp and face soft tissues appear negative. MRA NECK FINDINGS Motion degraded time-of-flight images. There is antegrade flow in the bilateral cervical carotid and vertebral arteries to the skull base. Detail of both carotid bifurcations is degraded, but there is no convincing high-grade carotid stenosis in the neck. Likewise vertebral detail is degraded. The left vertebral artery appears dominant. MRA HEAD FINDINGS Antegrade flow in the posterior circulation. Dominant left vertebral artery. No V4 segment stenosis. Both PICA appear patent. Patent vertebrobasilar junction and basilar artery without stenosis. Patent SCA and PCA origins. There is no proximal left PCA occlusion, the P1 and P2 segments are patent. Distal PCA branch detail is limited. Antegrade flow in both ICA siphons with patent carotid termini. Both siphons appear irregular, but no hemodynamically significant stenosis is evident. The MCA and ACA origins are normal. The anterior communicating artery and visible ACA branches are within normal limits. Both MCA M1 segments and bifurcations are patent. No MCA branch occlusion is identified. IMPRESSION: 1. Acute to subacute confluent Left PCA territory infarct with petechial hemorrhage, but no malignant hemorrhagic transformation or significant mass effect. 2. No other acute intracranial abnormality. 3. Motion degraded head and neck MRA are negative for large vessel occlusion. And no intracranial hemodynamically significant stenosis is identified. Electronically Signed: By: Odessa FlemingH  Hall M.D. On: 03/06/2019 15:31   Mr Brain Wo Contrast  Addendum Date: 03/06/2019   ADDENDUM REPORT: 03/06/2019 16:12 ADDENDUM: Study discussed by telephone with Dr. Rhona Leavenshiu on 03/06/2019  at 1550 hours. Electronically Signed   By: Odessa FlemingH  Hall M.D.   On: 03/06/2019 16:12   Result Date: 03/06/2019 CLINICAL DATA:  85 year old female with confusion and evidence of acute to subacute left PCA infarct on head CT yesterday. EXAM: MRI HEAD WITHOUT  CONTRAST MRA HEAD WITHOUT CONTRAST MRA NECK WITHOUT CONTRAST TECHNIQUE: Multiplanar, multiecho pulse sequences of the brain and surrounding structures were obtained without intravenous contrast. Angiographic images of the Circle of Willis were obtained using MRA technique without intravenous contrast. Angiographic images of the neck were obtained using MRA technique without intravenous contrast. Carotid stenosis measurements (when applicable) are obtained utilizing NASCET criteria, using the distal internal carotid diameter as the denominator. COMPARISON:  Head CT without contrast 03/05/2019. FINDINGS: Study is intermittently degraded by motion artifact despite repeated imaging attempts. MRI HEAD FINDINGS Brain: Confluent restricted diffusion in the left PCA territory corresponding to the hypodense area on CT yesterday. Associated hemorrhage on SWI (series 8, image 31) but no significant mass effect. A portion of the dorsal left thalamus is affected. Cytotoxic edema on T2 and FLAIR. No other restricted diffusion. No midline shift, mass effect, evidence of mass lesion, ventriculomegaly, extra-axial collection or acute intracranial hemorrhage. Cervicomedullary junction and pituitary are within normal limits. Confluent bilateral cerebral white matter T2 and FLAIR hyperintensity. Brainstem and cerebellum appear to remain normal for age. No chronic cerebral blood products identified. Vascular: Major intracranial vascular flow voids are preserved, see below. Skull and upper cervical spine: Cervical spine degraded by motion, grossly negative. Visible bone marrow signal appears normal. Sinuses/Orbits: Paranasal sinuses are clear. Orbits appear stable and negative. Other:  Mastoid air cells are clear. Scalp and face soft tissues appear negative. MRA NECK FINDINGS Motion degraded time-of-flight images. There is antegrade flow in the bilateral cervical carotid and vertebral arteries to the skull base. Detail of both carotid bifurcations is degraded, but there is no convincing high-grade carotid stenosis in the neck. Likewise vertebral detail is degraded. The left vertebral artery appears dominant. MRA HEAD FINDINGS Antegrade flow in the posterior circulation. Dominant left vertebral artery. No V4 segment stenosis. Both PICA appear patent. Patent vertebrobasilar junction and basilar artery without stenosis. Patent SCA and PCA origins. There is no proximal left PCA occlusion, the P1 and P2 segments are patent. Distal PCA branch detail is limited. Antegrade flow in both ICA siphons with patent carotid termini. Both siphons appear irregular, but no hemodynamically significant stenosis is evident. The MCA and ACA origins are normal. The anterior communicating artery and visible ACA branches are within normal limits. Both MCA M1 segments and bifurcations are patent. No MCA branch occlusion is identified. IMPRESSION: 1. Acute to subacute confluent Left PCA territory infarct with petechial hemorrhage, but no malignant hemorrhagic transformation or significant mass effect. 2. No other acute intracranial abnormality. 3. Motion degraded head and neck MRA are negative for large vessel occlusion. And no intracranial hemodynamically significant stenosis is identified. Electronically Signed: By: Odessa Fleming M.D. On: 03/06/2019 15:31    Scheduled Meds:  aspirin  300 mg Rectal Daily   Or   aspirin  325 mg Oral Daily   atorvastatin  40 mg Oral q1800   diltiazem  30 mg Oral Q6H   ferrous sulfate  325 mg Oral Q breakfast   sodium chloride flush  3 mL Intravenous Once   Continuous Infusions:  azithromycin 500 mg (03/07/19 1847)   cefTRIAXone (ROCEPHIN)  IV 1 g (03/07/19 2028)   dextrose  5 % and 0.9% NaCl Stopped (03/07/19 1900)   heparin 950 Units/hr (03/08/19 0701)     LOS: 3 days   Rickey Barbara, MD [facility] Pager On Amion  If 7PM-7AM, please contact night-coverage 03/08/2019, 2:17 PM

## 2019-03-09 DIAGNOSIS — Z720 Tobacco use: Secondary | ICD-10-CM

## 2019-03-09 DIAGNOSIS — I4891 Unspecified atrial fibrillation: Secondary | ICD-10-CM

## 2019-03-09 DIAGNOSIS — J189 Pneumonia, unspecified organism: Secondary | ICD-10-CM

## 2019-03-09 LAB — CBC
HCT: 28.8 % — ABNORMAL LOW (ref 36.0–46.0)
Hemoglobin: 9.3 g/dL — ABNORMAL LOW (ref 12.0–15.0)
MCH: 27 pg (ref 26.0–34.0)
MCHC: 32.3 g/dL (ref 30.0–36.0)
MCV: 83.7 fL (ref 80.0–100.0)
Platelets: 215 10*3/uL (ref 150–400)
RBC: 3.44 MIL/uL — ABNORMAL LOW (ref 3.87–5.11)
RDW: 25.3 % — ABNORMAL HIGH (ref 11.5–15.5)
WBC: 6 10*3/uL (ref 4.0–10.5)
nRBC: 0 % (ref 0.0–0.2)

## 2019-03-09 LAB — GLUCOSE, CAPILLARY
Glucose-Capillary: 124 mg/dL — ABNORMAL HIGH (ref 70–99)
Glucose-Capillary: 105 mg/dL — ABNORMAL HIGH (ref 70–99)
Glucose-Capillary: 90 mg/dL (ref 70–99)
Glucose-Capillary: 106 mg/dL — ABNORMAL HIGH (ref 70–99)
Glucose-Capillary: 95 mg/dL (ref 70–99)
Glucose-Capillary: 123 mg/dL — ABNORMAL HIGH (ref 70–99)

## 2019-03-09 LAB — BASIC METABOLIC PANEL
Anion gap: 8 (ref 5–15)
BUN: 13 mg/dL (ref 8–23)
CO2: 16 mmol/L — ABNORMAL LOW (ref 22–32)
Calcium: 8 mg/dL — ABNORMAL LOW (ref 8.9–10.3)
Chloride: 114 mmol/L — ABNORMAL HIGH (ref 98–111)
Creatinine, Ser: 0.87 mg/dL (ref 0.44–1.00)
GFR calc Af Amer: >60 mL/min (ref >60)
GFR calc non Af Amer: 60 mL/min — ABNORMAL LOW (ref >60)
Glucose, Bld: 97 mg/dL (ref 70–99)
Potassium: 3.3 mmol/L — ABNORMAL LOW (ref 3.5–5.1)
Sodium: 138 mmol/L (ref 135–145)

## 2019-03-09 LAB — MAGNESIUM: Magnesium: 1.7 mg/dL (ref 1.7–2.4)

## 2019-03-09 LAB — HEPARIN LEVEL (UNFRACTIONATED): Heparin Unfractionated: 0.38 IU/mL (ref 0.30–0.70)

## 2019-03-09 MED ORDER — MAGNESIUM SULFATE 2 GM/50ML IV SOLN
2.0000 g | Freq: Once | INTRAVENOUS | Status: AC
  Administered 2019-03-09: 2 g via INTRAVENOUS
  Filled 2019-03-09: qty 50

## 2019-03-09 MED ORDER — SODIUM CHLORIDE 0.9 % IV SOLN: 1.0000 g | INTRAVENOUS | Status: DC

## 2019-03-09 MED ORDER — AZITHROMYCIN 500 MG PO TABS
500.0000 mg | ORAL_TABLET | Freq: Every day | ORAL | Status: DC
  Filled 2019-03-09: qty 1

## 2019-03-09 MED ORDER — POTASSIUM CHLORIDE CRYS ER 20 MEQ PO TBCR
40.0000 meq | EXTENDED_RELEASE_TABLET | Freq: Once | ORAL | Status: AC
  Administered 2019-03-09: 40 meq via ORAL
  Filled 2019-03-09: qty 2

## 2019-03-09 NOTE — Progress Notes (Addendum)
Physical Therapy Treatment Patient Details Name: Sherry [name] MRN: [id number] DOB: 1929-07-26 Today's Date: 03/09/2019    History of Present Illness Sherry [name] is a 85 y.o. female presenting to the hospital via EMS for evaluation of worsening falls. with medical history significant of COPD, hypertension, hyperlipidemia, CAD status post stent, A. fib not on anticoagulation. Negative CT head. C-Collar in room     PT Comments    Patient seen for mobility progression. Pt requires mod A for bed mobility, mod/max A for functional transfers, and min A for short distance gait training. Pt demonstrates decreased activity tolerance and able to ambulate 12 ft this session before fatigued. 2-3L O2 via [place] donned throughout session and SpO2 85-87%. Continue to progress as tolerated with anticipated d/c to SNF for further skilled PT services.       Follow Up Recommendations  SNF;Supervision/Assistance - 24 hour     Equipment Recommendations  None recommended by PT    Recommendations for Other Services       Precautions / Restrictions Precautions Precautions: Fall Precaution Comments: has c-collar in room, but negative for C spine fxs Restrictions Weight Bearing Restrictions: No    Mobility  Bed Mobility Overal bed mobility: Needs Assistance Bed Mobility: Supine to Sit     Supine to sit: Mod assist     General bed mobility comments: cues for sequencing; assist to bring bilat LE and hips to EOB and to elevate trunk into sitting   Transfers Overall transfer level: Needs assistance Equipment used: Rolling walker (2 wheeled) Transfers: Sit to/from Stand Sit to Stand: Mod assist;Max assist         General transfer comment: pt stood X 2 trials; initial stand from EOB pt required max A to power up into standing and second trial from recliner mod A; cues for safe hand placement each trial  Ambulation/Gait Ambulation/Gait assistance: Min assist Gait Distance (Feet): 12 Feet Assistive  device: Rolling walker (2 wheeled) Gait Pattern/deviations: Decreased stride length;Trunk flexed;Step-through pattern;Narrow base of support Gait velocity: decreased   General Gait Details: assist for balance with cues for posture and safe use of AD; pt tends to maintain very flexed posture and keep RW far away   Stairs             Wheelchair Mobility    Modified Rankin (Stroke Patients Only) Modified Rankin (Stroke Patients Only) Pre-Morbid Rankin Score: Moderate disability Modified Rankin: Moderately severe disability     Balance Overall balance assessment: Needs assistance Sitting-balance support: Bilateral upper extremity supported;Feet supported Sitting balance-Leahy Scale: Poor     Standing balance support: Bilateral upper extremity supported;During functional activity Standing balance-Leahy Scale: Poor                              Cognition Arousal/Alertness: Awake/alert Behavior During Therapy: WFL for tasks assessed/performed Overall Cognitive Status: History of cognitive impairments - at baseline                                        Exercises      General Comments General comments (skin integrity, edema, etc.): cues for PLB; pt with SpO2 85-87% on 2-3L O2 via [place]; O2 up to 3L for gait due to desat at rest      Pertinent Vitals/Pain Pain Assessment: Faces Faces Pain Scale: Hurts little more Pain Location:  back, hips, "all over" Pain Descriptors / Indicators: Aching;Sore Pain Intervention(s): Limited activity within patient's tolerance;Monitored during session;Repositioned    Home Living                      Prior Function            PT Goals (current goals can now be found in the care plan section) Acute Rehab PT Goals Patient Stated Goal: to go home Progress towards PT goals: Progressing toward goals    Frequency    Min 3X/week      PT Plan Current plan remains appropriate    Co-evaluation               AM-PAC PT "6 Clicks" Mobility   Outcome Measure  Help needed turning from your back to your side while in a flat bed without using bedrails?: A Little Help needed moving from lying on your back to sitting on the side of a flat bed without using bedrails?: A Lot Help needed moving to and from a bed to a chair (including a wheelchair)?: A Lot Help needed standing up from a chair using your arms (e.g., wheelchair or bedside chair)?: A Lot Help needed to walk in hospital room?: A Lot Help needed climbing 3-5 steps with a railing? : Total 6 Click Score: 12    End of Session Equipment Utilized During Treatment: Gait belt Activity Tolerance: Patient limited by fatigue;Patient limited by pain Patient left: in chair;with call bell/phone within reach;with chair alarm set;with nursing/sitter in room;Other (comment)(NT present to take vitals end of session) Nurse Communication: Mobility status PT Visit Diagnosis: Other abnormalities of gait and mobility (R26.89);Difficulty in walking, not elsewhere classified (R26.2)     Time: 7096-2836 PT Time Calculation (min) (ACUTE ONLY): 34 min  Charges:  $Gait Training: 8-22 mins $Therapeutic Activity: 8-22 mins                     Earney Navy, PTA Acute Rehabilitation Services Pager: [phone] Office: [phone]     Darliss Cheney 03/09/2019, 1:16 PM

## 2019-03-09 NOTE — Progress Notes (Signed)
Per telemetry, pt had 4.66 second pause. Dr. Hilbert Bible informed.

## 2019-03-09 NOTE — Progress Notes (Signed)
Pt noted to have a 4 sec pause with elevated T wave per telemetry. Dr. Hilbert Bible informed.

## 2019-03-09 NOTE — Progress Notes (Signed)
[place] for Heparin Indication: atrial fibrillation, acute CVA  Heparin Dosing Weight: 47.6 kg  Labs: Recent Labs    03/07/19 0252 03/07/19 0445  03/07/19 2051 03/08/19 0610 03/09/19 0342  HGB 8.6*  --   --   --  9.6* 9.3*  HCT 26.3*  --   --   --  30.0* 28.8*  PLT 197  --   --   --  206 215  HEPARINUNFRC 0.26*  --    < > 0.67 0.61 0.38  CREATININE  --  1.07*  --   --   --  0.87   < > = values in this interval not displayed.    Assessment: 89 yof with hx of afib not on anticoagulation PTA presenting with falls, afib with RVR, subacute ischemic stroke. Pharmacy consulted to dose heparin per stroke protocol.   Heparin level therapeutic  Goal of Therapy:  Heparin level 0.3-0.5 units/ml, no boluses Monitor platelets by anticoagulation protocol: Yes   Plan:  Continue heparin at 850 units / hr Daily heparin level and CBC  Thank you Anette Guarneri, PharmD 03/09/2019, 8:56 AM

## 2019-03-09 NOTE — Plan of Care (Signed)
  Problem: Education: Goal: Knowledge of disease or condition will improve Outcome: Progressing Goal: Knowledge of secondary prevention will improve Outcome: Progressing Goal: Knowledge of patient specific risk factors addressed and post discharge goals established will improve Outcome: Progressing Goal: Individualized Educational Video(s) Outcome: Progressing   Problem: Coping: Goal: Will verbalize positive feelings about self Outcome: Progressing Goal: Will identify appropriate support needs Outcome: Progressing   Problem: Health Behavior/Discharge Planning: Goal: Ability to manage health-related needs will improve Outcome: Progressing   Problem: Education: Goal: Knowledge of General Education information will improve Description: Including pain rating scale, medication(s)/side effects and non-pharmacologic comfort measures Outcome: Progressing   Problem: Health Behavior/Discharge Planning: Goal: Ability to manage health-related needs will improve Outcome: Progressing   Problem: Clinical Measurements: Goal: Ability to maintain clinical measurements within normal limits will improve Outcome: Progressing Goal: Will remain free from infection Outcome: Progressing Goal: Diagnostic test results will improve Outcome: Progressing Goal: Respiratory complications will improve Outcome: Progressing Goal: Cardiovascular complication will be avoided Outcome: Progressing   Problem: Activity: Goal: Risk for activity intolerance will decrease Outcome: Progressing   Problem: Nutrition: Goal: Adequate nutrition will be maintained Outcome: Progressing   Problem: Coping: Goal: Level of anxiety will decrease Outcome: Progressing   Problem: Elimination: Goal: Will not experience complications related to bowel motility Outcome: Progressing Goal: Will not experience complications related to urinary retention Outcome: Progressing   Problem: Pain Managment: Goal: General  experience of comfort will improve Outcome: Progressing   Problem: Safety: Goal: Ability to remain free from injury will improve Outcome: Progressing   Problem: Skin Integrity: Goal: Risk for impaired skin integrity will decrease Outcome: Progressing   Ival Bible, BSN, RN

## 2019-03-09 NOTE — Progress Notes (Signed)
PROGRESS NOTE  Sherry [name] UJW:[id number] DOB: 19-Mar-1929 DOA: 03/05/2019 PCP: Darrow BussingKoirala, [place], MD  Brief History   85 year old woman PMH COPD, essential hypertension, hyperlipidemia, CAD status post stent, atrial fibrillation not on anticoagulation, presented via EMS for evaluation of fall.  Per chart the patient has had a fall at home most every day for the last 2 weeks prior to presentation.  2 weeks prior to presentation had a closed head injury.  Worsening memory for the last several months to years.  Has been using a walker to ambulate after hip replacement surgery 5 years prior.  PMH includes UTIs.  Multiple abnormalities found.  Admitted for atrial fibrillation with rapid ventricular response, possible pneumonia, possible UTI, CT head suspicious for acute/subacute nonhemorrhagic infarct medial left occipital lobe.  A & P  Subacute left PCA ischemic stroke with petechial hemorrhage, cardioembolic.  Initial exam did not reveal any focal motor deficit but did reveal a right homonymous hemianopsia. --MRI brain no large vessel stenosis or occlusion 2D echocardiogram with normal LVEF --Neurology recommended aspirin 325 mg daily, apixaban on discharge.    Plavix stopped. --Per speech therapy no needs identified --Physical therapy documented confused, impulsive, decreased safety awareness and poor memory as well as significant vision impairment and suspected depth perception deficits. --SNF recommended per therapy  Atrial fibrillation with rapid ventricular response.  Known atrial fibrillation not on anticoagulation as an outpatient.  On Plavix as an outpatient.  CHA2DS2-VASc 6. --Responded to Cardizem infusion and was successfully transitioned to oral therapy. --Dr. Rhona Leavenshiu discussed with cardiology, given the patient's high risk for stroke, it was felt that anticoagulation benefit outweighed risk and recommendation was for 2.5 mg apixaban twice daily; Dr. Rhona Leavenshiu also discussed with neurology who  concurred and recommend starting on discharge --4.66-second pause overnight, greater than 5 seconds on my review today (I was not notified) --Hold diltiazem temporarily.  Heart rate low 100s and patient is tolerating well.  Potassium low, continue repletion.  Check magnesium now. --Stop azithromycin  Acute encephalopathy thought secondary to stroke --Appears resolved.  Neurology suspects dementia.  Recurrent falls --Possibly secondary to recent stroke with new visual deficit --Therapy recommended SNF  AKI  --Resolved with IV fluids.    Possible community-acquired pneumonia considered on admission however not hypoxic, afebrile, no leukocytosis. --Treated with ceftriaxone and azithromycin --Wean off oxygen  Possible UTI considered on admission however culture was unrevealing. --Empirically treated with ceftriaxone, no further evaluation suggested.  Normocytic anemia --Appears stable, at baseline.  Follow-up as an outpatient if clinically indicated  Hyperlipidemia --Continue statin  Coronary artery disease status post stent --Plavix held per neurology as patient on aspirin and heparin  Suspected dementia per neurology --Appears stable  Hypoglycemia on admission, resolved.  No lows documented since 7/4 --Monitor.  Moderate to severe tricuspid regurgitation --Asymptomatic, follow-up with cardiology as an outpatient.   Will need SNF status post stroke.  Major issue at this point is uncontrolled heart rate with significant pauses.  Will consult cardiology to assist with management of rhythm.  Check EKG now.  Check magnesium.  Continue to replete potassium.  Fortunately patient hemodynamically stable and asymptomatic.  Zithromax stopped.  DVT prophylaxis: heparin Code Status: Full Family Communication: called daughter, no answer, will call later today Disposition Plan: SNF    Brendia Sacksaniel Endrit Gittins, MD  [facility] Direct contact: see [email or web address] (further directions at bottom  of note if needed) 7PM-7AM contact night coverage as at bottom of note 03/09/2019, 11:51 AM  LOS: 4 days   Consultants  .  Neurology  Procedures  . Echo IMPRESSIONS    1. The left ventricle has normal systolic function with an ejection fraction of 60-65%. The cavity size was normal. There is mildly increased left ventricular wall thickness. Left ventricular diastolic function could not be evaluated. Elevated mean left  atrial pressure.  2. The right ventricle has normal systolic function. The cavity was normal. Right ventricular systolic pressure is mildly elevated.  3. Left atrial size was severely dilated.  4. Right atrial size was severely dilated.  5. The mitral valve is abnormal. Mild thickening of the mitral valve leaflet. There is severe mitral annular calcification present.  6. The tricuspid valve is grossly normal. Tricuspid valve regurgitation is moderate-severe.  7. The aortic valve is tricuspid. Mild thickening of the aortic valve. Aortic valve regurgitation is mild by color flow Doppler. No stenosis of the aortic valve.  8. The inferior vena cava was dilated in size with <50% respiratory variability.  9. Mild apical hypokinesis with overall preserved LV function; mild LVH; severe biatrial enlargement; milld AI; moderate to severe TR; RVSP mildly elevated.  Antibiotics  . Azithromycin 7/4 > 7/7 . Ceftriaxone 7/4 > 7/7  Interval History/Subjective  --Documented 4.66-second pause. --Discussed with RN, no new issues noted --Patient reports hurting all over, some cough but no shortness of breath  Objective   Vitals:  Vitals:   03/09/19 0425 03/09/19 0751  BP: (!) 144/88 117/77  Pulse: 77 83  Resp: 18 18  Temp: 99 F (37.2 C) 98.7 F (37.1 C)  SpO2: 91% 91%    Exam:  Constitutional:  . Appears calm and comfortable sitting in chair Eyes:  . pupils and irises appear normal . Normal lids  ENMT:  . Slightly hard of hearing . Lips appear normal Respiratory:   . CTA bilaterally, no w/r/r.  . Respiratory effort normal.  Cardiovascular:  . Irregular, tachycardic, no murmur rub or gallop . No LE extremity edema   . Telemetry atrial fibrillation with rapid ventricular response variable rate 100s-120s.  Pause overnight 4.66 seconds.  Pause today greater than 5 seconds (I was not notified of this) Skin:  . Ecchymosis noted on chest, right side of the face Psychiatric:  . Mental status o Mood, affect appropriate o Orientation to person, hospital of some kind, not month or year  I have personally reviewed the following:   Today's Data  . Potassium 3.3, remainder BMP unremarkable . Hemoglobin stable at 9.3, remainder CBC unremarkable  Lab Data  . SARS-CoV-2 negative  Micro Data  . Blood cultures no growth 4 days . Urine culture unrevealing, multiple morphotypes, none predominant  Imaging  . CT head abnormality noted.  CT cervical spine no acute issues noted . MRI brain showed acute to subacute left PCA territory infarct with petechial hemorrhage, no malignant hemorrhagic transformation or significant mass-effect.  No evidence of large vessel occlusion.  Cardiology: Cardiology Data  . EKG showed atrial fibrillation with rapid ventricular response  Other Data   Scheduled Meds: . aspirin  300 mg Rectal Daily   Or  . aspirin  325 mg Oral Daily  . atorvastatin  40 mg Oral q1800  . azithromycin  500 mg Oral QHS  . diltiazem  60 mg Oral Q6H  . ferrous sulfate  325 mg Oral Q breakfast  . sodium chloride flush  3 mL Intravenous Once   Continuous Infusions: . cefTRIAXone (ROCEPHIN)  IV    . heparin 850 Units/hr (03/08/19 1400)    Principal Problem:  Acute CVA (cerebrovascular accident) ([place]) Active Problems:   Atrial fibrillation with rapid ventricular response ([place])   CAP (community acquired pneumonia)   UTI (urinary tract infection)   Hypoglycemia   LOS: 4 days   How to contact the [facility] Attending or Consulting provider [place]  or covering provider during after hours [place], for this patient?  1. Check the care team in [facility] and look for a) attending/consulting [facility] provider listed and b) the [facility] team listed 2. Log into [email or web address] and use [place]'s universal password to access. If you do not have the password, please contact the hospital operator. 3. Locate the [facility] provider you are looking for under [facility] and page to a number that you can be directly reached. 4. If you still have difficulty reaching the provider, please page the [facility] (Director on Call) for the Hospitalists listed on amion for assistance.

## 2019-03-09 NOTE — Consult Note (Signed)
Cardiology Consultation:   Patient ID: Sherry [name] MRN: [id number]; DOB: 09/26/1928  Admit date: 03/05/2019 Date of Consult: 03/09/2019  Primary Care Provider: Lujean Amel, MD Primary Cardiologist: No primary care provider on file.  Primary Electrophysiologist:  None    Patient Profile:   Sherry [name] is a 85 y.o. female with a hx of COPD, HTN, HLD, CAD, recurrent UTIs, some degree of what sounds like baseline dementia with waxing/waning mental status and memory deficits, and AFib, not on a/c who is being seen today for the evaluation of possible  Tachy-brady at the request of Dr. Sarajane Jews.  History of Present Illness:   [name] was hospitalized in feb with febrile illness (Corona virus), at that time presented with AFib RVR, managed with dilt gtt and by notes looks like had spontaneous conversion to SR, was felt a fall risk and not place on a/c (not seen by cardiology).  This appears to be her first presentation of AFib.   She presents to [facility], admitted 03/05/2019 with sudden up-tick of falls at home in the last couple weeks to daily occurrence, and a finding of stroke.  She was also again noted in fast AFib. She has subsequently with neurology looks like planned to be started on Eliquis for a/c.  Neurology team has signed off case.  Initially she was treated with dilt gtt for rate control.  She was suspect as well to have UTI, though culture unrevealing and treated empirically, also some concern for pneumonia, also treated with antibiotics empirically.  No fever or leukocytosis is noted.  Today IM note on telemetry a >5 second pause with PO dilt on board, and was discontinued, we are called to weigh in on possible tachy-brady.    Diltiazem 60mg  Q6, no other nodal blocking agents  BP stable LABS K+ 3.3 (IM is addressing) Mag 1.7 BUN/Creat 13/0.87 WBC 6.0 H/H 9.3/28.8 Plts 215   Heart Pathway Score:     Past Medical History:  Diagnosis Date   COPD (chronic obstructive  pulmonary disease) (HCC)    Hypercholesteremia    Hypertension     Past Surgical History:  Procedure Laterality Date   CORONARY ANGIOPLASTY WITH STENT PLACEMENT     HIP SURGERY       Home Medications:  Prior to Admission medications   Medication Sig Start Date End Date Taking? Authorizing Provider  acetaminophen (TYLENOL) 500 MG tablet Take 2 tablets (1,000 mg total) by mouth every 8 (eight) hours as needed for mild pain, fever or headache. Patient taking differently: Take 1,000 mg by mouth every 4 (four) hours as needed (for pain).  10/13/17  Yes Geradine Girt, DO  aspirin 81 MG chewable tablet Chew 81 mg by mouth daily. 12/05/13  Yes [provider]  atorvastatin (LIPITOR) 40 MG tablet Take 40 mg by mouth daily.  12/22/16  Yes [provider]  clopidogrel (PLAVIX) 75 MG tablet Take 75 mg by mouth daily. 10/05/17  Yes [provider]  diltiazem (CARDIZEM CD) 180 MG 24 hr capsule Take 1 capsule (180 mg total) by mouth daily. 10/13/17  Yes Vann, Jessica U, DO  hydrOXYzine (ATARAX/VISTARIL) 50 MG tablet Take 50 mg by mouth daily.  01/20/19  Yes [provider]  telmisartan (MICARDIS) 40 MG tablet Take 40 mg by mouth daily. 12/31/18  Yes [provider]  traMADol (ULTRAM) 50 MG tablet Take 50 mg by mouth daily as needed (for pain).   Yes [provider]  cephALEXin (KEFLEX) 500 MG  capsule Take 1 capsule (500 mg total) by mouth 2 (two) times daily. Patient not taking: Reported on 03/05/2019 10/13/17   Joseph ArtVann, Jessica U, DO  ipratropium-albuterol (DUONEB) 0.5-2.5 (3) MG/3ML SOLN Take 3 mLs by nebulization every 4 (four) hours as needed (sob/wheezing). Patient not taking: Reported on 03/05/2019 10/13/17   Joseph ArtVann, Jessica U, DO    Inpatient Medications: Scheduled Meds:  aspirin  300 mg Rectal Daily   Or   aspirin  325 mg Oral Daily   atorvastatin  40 mg Oral q1800   ferrous sulfate  325 mg Oral Q breakfast   potassium chloride  40 mEq Oral  Once   sodium chloride flush  3 mL Intravenous Once   Continuous Infusions:  heparin 850 Units/hr (03/09/19 1224)   magnesium sulfate bolus IVPB     PRN Meds: acetaminophen **OR** acetaminophen (TYLENOL) oral liquid 160 mg/5 mL **OR** acetaminophen, senna-docusate  Allergies:   No Known Allergies  Social History:   Social History   Socioeconomic History   Marital status: Widowed    Spouse name: Not on file   Number of children: Not on file   Years of education: Not on file   Highest education level: Not on file  Occupational History   Not on file  Social Needs   Financial resource strain: Not on file   Food insecurity    Worry: Not on file    Inability: Not on file   Transportation needs    Medical: Not on file    Non-medical: Not on file  Tobacco Use   Smoking status: Current Every Day Smoker    Packs/day: 1.00    Types: Cigarettes   Smokeless tobacco: Never Used  Substance and Sexual Activity   Alcohol use: No    Frequency: Never   Drug use: No   Sexual activity: Not on file  Lifestyle   Physical activity    Days per week: Not on file    Minutes per session: Not on file   Stress: Not on file  Relationships   Social connections    Talks on phone: Not on file    Gets together: Not on file    Attends religious service: Not on file    Active member of club or organization: Not on file    Attends meetings of clubs or organizations: Not on file    Relationship status: Not on file   Intimate partner violence    Fear of current or ex partner: Not on file    Emotionally abused: Not on file    Physically abused: Not on file    Forced sexual activity: Not on file  Other Topics Concern   Not on file  Social History Narrative   Not on file    Family History:   Family History  Problem Relation Age of Onset   Heart attack Mother      ROS:  Please see the history of present illness.  All other ROS reviewed and negative.     Physical  Exam/Data:   Vitals:   03/08/19 2311 03/09/19 0425 03/09/19 0751 03/09/19 1208  BP: 120/61 (!) 144/88 117/77 139/84  Pulse: (!) 55 77 83 (!) 118  Resp: 15 18 18 18   Temp: 99 F (37.2 C) 99 F (37.2 C) 98.7 F (37.1 C) 98.3 F (36.8 C)  TempSrc: Oral Oral Oral Oral  SpO2: 90% 91% 91% 91%  Weight:      Height:  Intake/Output Summary (Last 24 hours) at 03/09/2019 1443 Last data filed at 03/09/2019 1300 Gross per 24 hour  Intake 1470 ml  Output 500 ml  Net 970 ml   Last 3 Weights 03/05/2019 10/13/2017 10/12/2017  Weight (lbs) 105 lb 110 lb 3.2 oz 112 lb 6.4 oz  Weight (kg) 47.628 kg 49.986 kg 50.984 kg     Body mass index is 19.2 kg/m.  General:  Chronically ill appearing, no acute distress HEENT: normal Lymph: no adenopathy Neck: 6 cm JVD Endocrine:  No thryomegaly Vascular: No carotid bruits; FA pulses 2+ bilaterally without bruits  Cardiac:  normal S1, S2; IRIRR; no murmur  Lungs:  Scattered rales, but no increased work of breathing. Abd: soft, nontender, no hepatomegaly  Ext: no edema Musculoskeletal:  No deformities, BUE and BLE strength normal and equal Skin: warm and dry  Neuro:  CNs 2-12 intact, no focal abnormalities noted Psych:  Normal affect   EKG:  The EKG was personally reviewed and demonstrates:    AFib 119bpm, no acute ischemic changes appreciated AFib  116bpm, no acute ischemic changes appreciated  Telemetry:  Telemetry was personally reviewed and demonstrates:  Atrial fib with a RVR, CVR and slow VR    Relevant CV Studies:  03/06/2019 IMPRESSIONS 1. The left ventricle has normal systolic function with an ejection fraction of 60-65%. The cavity size was normal. There is mildly increased left ventricular wall thickness. Left ventricular diastolic function could not be evaluated. Elevated mean left atrial pressure. 2. The right ventricle has normal systolic function. The cavity was normal. Right ventricular systolic pressure is mildly  elevated. 3. Left atrial size was severely dilated. 4. Right atrial size was severely dilated. 5. The mitral valve is abnormal. Mild thickening of the mitral valve leaflet. There is severe mitral annular calcification present. 6. The tricuspid valve is grossly normal. Tricuspid valve regurgitation is moderate-severe. 7. The aortic valve is tricuspid. Mild thickening of the aortic valve. Aortic valve regurgitation is mild by color flow Doppler. No stenosis of the aortic valve. 8. The inferior vena cava was dilated in size with <50% respiratory variability. 9. Mild apical hypokinesis with overall preserved LV function; mild LVH; severe biatrial enlargement; milld AI; moderate to severe TR; RVSP mildly elevated.  Laboratory Data:  High Sensitivity Troponin:  No results for input(s): TROPONINIHS in the last 720 hours.   Cardiac EnzymesNo results for input(s): TROPONINI in the last 168 hours. No results for input(s): TROPIPOC in the last 168 hours.  Chemistry Recent Labs  Lab 03/06/19 0529 03/07/19 0445 03/09/19 0342  NA 135 138 138  K 3.1* 3.8 3.3*  CL 108 111 114*  CO2 18* 17* 16*  GLUCOSE 87 97 97  BUN 19 22 13   CREATININE 0.97 1.07* 0.87  CALCIUM 8.1* 8.0* 8.0*  GFRNONAA 53* 47* 60*  GFRAA >60 54* >60  ANIONGAP 9 10 8     Recent Labs  Lab 03/05/19 1720  PROT 6.0*  ALBUMIN 3.2*  AST 43*  ALT 16  ALKPHOS 58  BILITOT 0.9   Hematology Recent Labs  Lab 03/07/19 0252 03/08/19 0610 03/09/19 0342  WBC 6.4 6.0 6.0  RBC 3.18* 3.55* 3.44*  HGB 8.6* 9.6* 9.3*  HCT 26.3* 30.0* 28.8*  MCV 82.7 84.5 83.7  MCH 27.0 27.0 27.0  MCHC 32.7 32.0 32.3  RDW 24.9* 25.4* 25.3*  PLT 197 206 215   BNPNo results for input(s): BNP, PROBNP in the last 168 hours.  DDimer No results for input(s): DDIMER in  the last 168 hours.   Radiology/Studies:   Ct Head Wo Contrast Result Date: 03/05/2019 CLINICAL DATA:  Frequent falls.  Altered mental status. EXAM: CT HEAD WITHOUT CONTRAST CT  CERVICAL SPINE WITHOUT CONTRAST TECHNIQUE: Multidetector CT imaging of the head and cervical spine was performed following the standard protocol without intravenous contrast. Multiplanar CT image reconstructions of the cervical spine were also generated. COMPARISON:  None. FINDINGS: CT HEAD FINDINGS Brain: No evidence of parenchymal hemorrhage or extra-axial fluid collection. No mass lesion, mass effect, or midline shift. Loss of gray-white differentiation in medial left occipital lobe with associated mild sulcal effacement. Generalized cerebral volume loss. Nonspecific prominent subcortical and periventricular white matter hypodensity, most in keeping with chronic small vessel ischemic change. Cerebral ventricle sizes are concordant with the degree of cerebral volume loss. Vascular: No acute abnormality. Skull: No evidence of calvarial fracture. Sinuses/Orbits: The visualized paranasal sinuses are essentially clear. Other:  The mastoid air cells are unopacified. CT CERVICAL SPINE FINDINGS Alignment: Mild straightening of the cervical spine. No facet subluxation. Dens is well positioned between the lateral masses of C1. Minimal 2 mm anterolisthesis at C3-4, C4-5 and C7-T1. Skull base and vertebrae: No acute fracture. No primary bone lesion or focal pathologic process. Soft tissues and spinal canal: No prevertebral edema. No visible canal hematoma. Disc levels: Marked multilevel cervical degenerative disc disease, most prominent at C5-6. Advanced bilateral facet arthropathy. Moderate bilateral degenerative foraminal stenosis at C3-4. Severe degenerative foraminal stenosis on the right at C4-5. Mild to moderate degenerative foraminal stenosis on the left at C5-6 and C6-7. Upper chest: No acute abnormality. Other: Visualized mastoid air cells appear clear. No discrete thyroid nodules. No pathologically enlarged cervical nodes. IMPRESSION: 1. Loss of gray-white differentiation in the medial left occipital lobe with  associated mild sulcal effacement, suspicious for acute/subacute nonhemorrhagic infarct. 2. No acute intracranial hemorrhage. 3. Generalized cerebral volume loss and prominent chronic small vessel ischemic changes in the cerebral white matter. 4. No cervical spine fracture or facet subluxation. 5. Advanced degenerative changes throughout the cervical spine as detailed. Critical Value/emergent results were called by telephone at the time of interpretation on 03/05/2019 at 6:52 pm to Dr. Alvira Monday , who verbally acknowledged these results. Electronically Signed   By: Delbert Phenix M.D.   On: 03/05/2019 18:55     Mr Angio Head Wo Contrast Addendum Date: 03/06/2019   ADDENDUM REPORT: 03/06/2019 16:12 ADDENDUM: Study discussed by telephone with Dr. Rhona Leavens on 03/06/2019 at 1550 hours. Electronically Signed   By: Odessa Fleming M.D.   On: 03/06/2019 16:12  Result Date: 03/06/2019 CLINICAL DATA:  86 year old female with confusion and evidence of acute to subacute left PCA infarct on head CT yesterday. EXAM: MRI HEAD WITHOUT CONTRAST MRA HEAD WITHOUT CONTRAST MRA NECK WITHOUT CONTRAST TECHNIQUE: Multiplanar, multiecho pulse sequences of the brain and surrounding structures were obtained without intravenous contrast. Angiographic images of the Circle of Willis were obtained using MRA technique without intravenous contrast. Angiographic images of the neck were obtained using MRA technique without intravenous contrast. Carotid stenosis measurements (when applicable) are obtained utilizing NASCET criteria, using the distal internal carotid diameter as the denominator. COMPARISON:  Head CT without contrast 03/05/2019. FINDINGS: Study is intermittently degraded by motion artifact despite repeated imaging attempts. MRI HEAD FINDINGS Brain: Confluent restricted diffusion in the left PCA territory corresponding to the hypodense area on CT yesterday. Associated hemorrhage on SWI (series 8, image 31) but no significant mass effect. A  portion of the dorsal left thalamus is  affected. Cytotoxic edema on T2 and FLAIR. No other restricted diffusion. No midline shift, mass effect, evidence of mass lesion, ventriculomegaly, extra-axial collection or acute intracranial hemorrhage. Cervicomedullary junction and pituitary are within normal limits. Confluent bilateral cerebral white matter T2 and FLAIR hyperintensity. Brainstem and cerebellum appear to remain normal for age. No chronic cerebral blood products identified. Vascular: Major intracranial vascular flow voids are preserved, see below. Skull and upper cervical spine: Cervical spine degraded by motion, grossly negative. Visible bone marrow signal appears normal. Sinuses/Orbits: Paranasal sinuses are clear. Orbits appear stable and negative. Other: Mastoid air cells are clear. Scalp and face soft tissues appear negative. MRA NECK FINDINGS Motion degraded time-of-flight images. There is antegrade flow in the bilateral cervical carotid and vertebral arteries to the skull base. Detail of both carotid bifurcations is degraded, but there is no convincing high-grade carotid stenosis in the neck. Likewise vertebral detail is degraded. The left vertebral artery appears dominant. MRA HEAD FINDINGS Antegrade flow in the posterior circulation. Dominant left vertebral artery. No V4 segment stenosis. Both PICA appear patent. Patent vertebrobasilar junction and basilar artery without stenosis. Patent SCA and PCA origins. There is no proximal left PCA occlusion, the P1 and P2 segments are patent. Distal PCA branch detail is limited. Antegrade flow in both ICA siphons with patent carotid termini. Both siphons appear irregular, but no hemodynamically significant stenosis is evident. The MCA and ACA origins are normal. The anterior communicating artery and visible ACA branches are within normal limits. Both MCA M1 segments and bifurcations are patent. No MCA branch occlusion is identified. IMPRESSION: 1. Acute to  subacute confluent Left PCA territory infarct with petechial hemorrhage, but no malignant hemorrhagic transformation or significant mass effect. 2. No other acute intracranial abnormality. 3. Motion degraded head and neck MRA are negative for large vessel occlusion. And no intracranial hemodynamically significant stenosis is identified. Electronically Signed: By: Odessa FlemingH  Hall M.D. On: 03/06/2019 15:31     Dg Chest Portable 1 View Result Date: 03/05/2019 CLINICAL DATA:  Fever EXAM: PORTABLE CHEST 1 VIEW COMPARISON:  10/11/2017 chest radiograph. FINDINGS: Stable cardiomediastinal silhouette with mild cardiomegaly. No pneumothorax. No pleural effusion. Suggestion of a mild patchy lingular opacity. No pulmonary edema. IMPRESSION: Suggestion of a mild patchy lingular opacity, cannot exclude pneumonia. Chest radiograph follow-up advised. Electronically Signed   By: Delbert PhenixJason A Poff M.D.   On: 03/05/2019 17:47    Assessment and Plan:   1. Atrial fib with a RVR/CVR/Slow VR - the patient has had nocturnal bradycardia and brief pauses while awake, none particularly long. I would suggest she have her dose of cardizem be switched to 180 mg daily.  2. Ongoing tobacco abuse - I encouraged her to stop smoking  [facility] will sign off.   Medication Recommendations:  As above Other recommendations (labs, testing, etc):  none Follow up as an outpatient:  none  For questions or updates, please contact [facility] Please consult [email or web address] for contact info under    Leonia ReevesGregg Teala Daffron,M.D.

## 2019-03-10 DIAGNOSIS — R001 Bradycardia, unspecified: Secondary | ICD-10-CM

## 2019-03-10 LAB — BASIC METABOLIC PANEL
Anion gap: 7 (ref 5–15)
BUN: 10 mg/dL (ref 8–23)
CO2: 18 mmol/L — ABNORMAL LOW (ref 22–32)
Calcium: 8.5 mg/dL — ABNORMAL LOW (ref 8.9–10.3)
Chloride: 114 mmol/L — ABNORMAL HIGH (ref 98–111)
Creatinine, Ser: 0.84 mg/dL (ref 0.44–1.00)
GFR calc Af Amer: >60 mL/min (ref >60)
GFR calc non Af Amer: >60 mL/min (ref >60)
Glucose, Bld: 91 mg/dL (ref 70–99)
Potassium: 4.8 mmol/L (ref 3.5–5.1)
Sodium: 139 mmol/L (ref 135–145)

## 2019-03-10 LAB — CULTURE, BLOOD (ROUTINE X 2)
Culture: NO GROWTH
Culture: NO GROWTH

## 2019-03-10 LAB — CBC
HCT: 28.8 % — ABNORMAL LOW (ref 36.0–46.0)
Hemoglobin: 9.1 g/dL — ABNORMAL LOW (ref 12.0–15.0)
MCH: 26.8 pg (ref 26.0–34.0)
MCHC: 31.6 g/dL (ref 30.0–36.0)
MCV: 85 fL (ref 80.0–100.0)
Platelets: 217 10*3/uL (ref 150–400)
RBC: 3.39 MIL/uL — ABNORMAL LOW (ref 3.87–5.11)
RDW: 26 % — ABNORMAL HIGH (ref 11.5–15.5)
WBC: 5.9 10*3/uL (ref 4.0–10.5)
nRBC: 0.3 % — ABNORMAL HIGH (ref 0.0–0.2)

## 2019-03-10 LAB — GLUCOSE, CAPILLARY
Glucose-Capillary: 107 mg/dL — ABNORMAL HIGH (ref 70–99)
Glucose-Capillary: 123 mg/dL — ABNORMAL HIGH (ref 70–99)
Glucose-Capillary: 93 mg/dL (ref 70–99)
Glucose-Capillary: 96 mg/dL (ref 70–99)
Glucose-Capillary: 99 mg/dL (ref 70–99)
Glucose-Capillary: 99 mg/dL (ref 70–99)

## 2019-03-10 LAB — NOVEL CORONAVIRUS, NAA (HOSP ORDER, SEND-OUT TO REF LAB; TAT 18-24 HRS): SARS-CoV-2, NAA: NOT DETECTED

## 2019-03-10 LAB — MAGNESIUM: Magnesium: 2.2 mg/dL (ref 1.7–2.4)

## 2019-03-10 LAB — HEPARIN LEVEL (UNFRACTIONATED): Heparin Unfractionated: 0.31 IU/mL (ref 0.30–0.70)

## 2019-03-10 NOTE — Progress Notes (Signed)
Per telemetry, pt may have had 2nd degree heart block noted at 0702. Dr. Sarajane Jews informed.

## 2019-03-10 NOTE — Progress Notes (Signed)
Occupational Therapy Treatment Patient Details Name: Sherry [name] MRN: [id number] DOB: February 24, 1929 Today's Date: 03/10/2019    History of present illness Sherry [name] is a 85 y.o. female presenting to the hospital via EMS for evaluation of worsening falls. with medical history significant of COPD, hypertension, hyperlipidemia, CAD status post stent, A. fib not on anticoagulation. Negative CT head. C-Collar in room    OT comments  Pt progressing well with modA for bed mobility and transfers using RW for stability. Pt continues to be set-upA for grooming seated. Pt requires encouragement to perform tasks. Visual scanning intact, but pt reports almost no vision in R eye with L eye closed. Pt continues to benefit from continued OT skilled services for ADL, mobility and visual perception. OT following.    Follow Up Recommendations  SNF;Supervision/Assistance - 24 hour    Equipment Recommendations  3 in 1 bedside commode    Recommendations for Other Services      Precautions / Restrictions Precautions Precautions: Fall Precaution Comments: has c-collar in room, but negative for C spine fxs Restrictions Weight Bearing Restrictions: No       Mobility Bed Mobility Overal bed mobility: Needs Assistance Bed Mobility: Supine to Sit     Supine to sit: Mod assist     General bed mobility comments: cues for sequencing; assist to bring bilat LE and hips to EOB and to elevate trunk into sitting   Transfers Overall transfer level: Needs assistance Equipment used: Rolling walker (2 wheeled) Transfers: Sit to/from UGI CorporationStand;Stand Pivot Transfers Sit to Stand: Mod assist Stand pivot transfers: Mod assist       General transfer comment: Pt sit to stand attempts x2 times and finally on 3rd, pt requiring minA for power up    Balance Overall balance assessment: Needs assistance Sitting-balance support: Bilateral upper extremity supported;Feet supported Sitting balance-Leahy Scale: Fair      Standing balance support: Bilateral upper extremity supported;During functional activity Standing balance-Leahy Scale: Poor Standing balance comment: requires RW for stability                           ADL either performed or assessed with clinical judgement   ADL Overall ADL's : Needs assistance/impaired     Grooming: Set up;Sitting Grooming Details (indicate cue type and reason): Pt not enthused to be performing grooming in chair, but pt willing to participate with set-upA                             Functional mobility during ADLs: Minimal assistance;Rolling walker;Cueing for safety;Cueing for sequencing General ADL Comments: set-upA for grooming in sitting no further ADL achieved. Pt transferred to recliner with minA and RW for stability with slow steps.     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   Perception     Praxis      Cognition Arousal/Alertness: Awake/alert Behavior During Therapy: WFL for tasks assessed/performed Overall Cognitive Status: History of cognitive impairments - at baseline                                 General Comments: Pt followed commands.        Exercises     Shoulder Instructions       General Comments O2 sats  87% on RA; >90% on 2L O2.    Pertinent Vitals/ Pain       Pain Assessment: Faces Faces Pain Scale: Hurts a little bit Pain Location: back, hips, "all over" Pain Descriptors / Indicators: Aching;Sore Pain Intervention(s): Monitored during session  Home Living                                          Prior Functioning/Environment              Frequency  Min 2X/week        Progress Toward Goals  OT Goals(current goals can now be found in the care plan section)  Progress towards OT goals: Progressing toward goals  Acute Rehab OT Goals Patient Stated Goal:  to go home OT Goal Formulation: With patient Time For Goal Achievement: 03/20/19 Potential to Achieve Goals: Good ADL Goals Pt Will Perform Grooming: with supervision;standing Pt Will Perform Lower Body Dressing: with supervision;sit to/from stand Pt Will Perform Toileting - Clothing Manipulation and hygiene: with supervision;sit to/from stand;sitting/lateral leans Additional ADL Goal #1: Pt will be supervisionA level for OOB ADL with fair balance and least restrictive AD.  Plan Discharge plan remains appropriate    Co-evaluation                 AM-PAC OT "6 Clicks" Daily Activity     Outcome Measure   Help from another person eating meals?: A Little Help from another person taking care of personal grooming?: A Little Help from another person toileting, which includes using toliet, bedpan, or urinal?: A Lot Help from another person bathing (including washing, rinsing, drying)?: A Lot Help from another person to put on and taking off regular upper body clothing?: A Little Help from another person to put on and taking off regular lower body clothing?: A Lot 6 Click Score: 15    End of Session Equipment Utilized During Treatment: Gait belt;Rolling walker  OT Visit Diagnosis: Unsteadiness on feet (R26.81);Muscle weakness (generalized) (M62.81);Repeated falls (R29.6)   Activity Tolerance Patient tolerated treatment well   Patient Left in chair;with call bell/phone within reach;with chair alarm set   Nurse Communication Mobility status        Time: 1406-1430 OT Time Calculation (min): 24 min  Charges: OT General Charges $OT Visit: 1 Visit OT Treatments $Self Care/Home Management : 8-22 mins $Therapeutic Activity: 8-22 mins  Darryl Nestle) Marsa Aris OTR/L Acute Rehabilitation Services Pager: [phone] Office: [phone]    Audie Pinto 03/10/2019, 3:16 PM

## 2019-03-10 NOTE — Progress Notes (Addendum)
PROGRESS NOTE  Sherry [name] FUX:[id number] DOB: 1929/02/08 DOA: 03/05/2019 PCP: Darrow BussingKoirala, Dibas, MD  Brief History   85 year old woman PMH COPD, essential hypertension, hyperlipidemia, CAD status post stent, atrial fibrillation not on anticoagulation, presented via EMS for evaluation of fall.  Per chart the patient has had a fall at home most every day for the last 2 weeks prior to presentation.  2 weeks prior to presentation had a closed head injury.  Worsening memory for the last several months to years.  Has been using a walker to ambulate after hip replacement surgery 5 years prior.  PMH includes UTIs.  Multiple abnormalities found.  Admitted for atrial fibrillation with rapid ventricular response, possible pneumonia, possible UTI, CT head suspicious for acute/subacute nonhemorrhagic infarct medial left occipital lobe.  A & P  Subacute left PCA ischemic stroke with petechial hemorrhage, cardioembolic.  Initial exam did not reveal any focal motor deficit but did reveal a right homonymous hemianopsia. MRI brain no large vessel stenosis or occlusion 2D echocardiogram with normal LVEF.  --Continue as per neurology: Aspirin 325 mg daily, apixaban on discharge.    Plavix stopped. --Per speech therapy no needs identified --Physical therapy documented confused, impulsive, decreased safety awareness and poor memory as well as significant vision impairment and suspected depth perception deficits. --SNF recommended per therapy --No new issues  Atrial fibrillation with rapid ventricular response, slow ventricular response.  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  Known atrial fibrillation not on anticoagulation as an outpatient.  On Plavix as an outpatient.  CHA2DS2-VASc 6.  Initially responded to Cardizem infusion and was successfully transitioned to oral therapy. Dr. Rhona Leavenshiu discussed with cardiology, given the patient's high risk for stroke, it was felt that anticoagulation benefit outweighed risk and  recommendation was for 2.5 mg apixaban twice daily; Dr. Rhona Leavenshiu also discussed with neurology who concurred and recommend starting on discharge --Now with rapid ventricular response since about 2 AM this morning.  No ischemic changes on EKG.  Patient is asymptomatic.  Normotensive. --Start diltiazem infusion --I placed cardiology consult twice yesterday and confirmed receipt, but do not see patient was seen. Consult placed again this AM.  Acute encephalopathy thought secondary to stroke --Resolved..  Neurology suspects dementia.  Recurrent falls --Possibly secondary to recent stroke with new visual deficit --Therapy recommends SNF  AKI  --Resolved with IV fluids.    Possible community-acquired pneumonia considered on admission however not hypoxic, afebrile, no leukocytosis. --Treated with ceftriaxone and azithromycin (completed) --Wean off oxygen, down to 1 L  Possible UTI considered on admission however culture was unrevealing. --Empirically treated with ceftriaxone, no further evaluation suggested.  Normocytic anemia --Remains stable.  Follow-up as an outpatient if clinically indicated  Hyperlipidemia --Continue statin  Coronary artery disease status post stent --Plavix held per neurology as patient on aspirin and heparin  Suspected dementia per neurology --Appears stable  Hypoglycemia on admission, resolved.  No lows documented since 7/4 --Monitor.  Moderate to severe tricuspid regurgitation --Asymptomatic, follow-up with cardiology as an outpatient.   SNF when stable, only issue preventing discharges tacky bradycardia.  Was bradycardic all day yesterday, not tachycardic again.  Start diltiazem.  Cardiology evaluation today.    Attempted again to contact daughter last evening, no answer.  Will call again today to discuss current condition as well as anticoagulation.   Note that I accidentally addended the note 7/9 with these revisions, which represent the 7/10 note.   DVT prophylaxis: heparin Code Status: Full Family Communication: Did not answer phone yesterday Disposition Plan: SNF  [name], MD  [facility] Direct contact: see [email or web address] (further directions at bottom of note if needed) 7PM-7AM contact night coverage as at bottom of note 03/10/2019, 9:00 AM  LOS: 5 days   Consultants  . Neurology  Procedures  . Echo IMPRESSIONS    1. The left ventricle has normal systolic function with an ejection fraction of 60-65%. The cavity size was normal. There is mildly increased left ventricular wall thickness. Left ventricular diastolic function could not be evaluated. Elevated mean left  atrial pressure.  2. The right ventricle has normal systolic function. The cavity was normal. Right ventricular systolic pressure is mildly elevated.  3. Left atrial size was severely dilated.  4. Right atrial size was severely dilated.  5. The mitral valve is abnormal. Mild thickening of the mitral valve leaflet. There is severe mitral annular calcification present.  6. The tricuspid valve is grossly normal. Tricuspid valve regurgitation is moderate-severe.  7. The aortic valve is tricuspid. Mild thickening of the aortic valve. Aortic valve regurgitation is mild by color flow Doppler. No stenosis of the aortic valve.  8. The inferior vena cava was dilated in size with <50% respiratory variability.  9. Mild apical hypokinesis with overall preserved LV function; mild LVH; severe biatrial enlargement; milld AI; moderate to severe TR; RVSP mildly elevated.  Antibiotics  . Azithromycin 7/4 > 7/7 . Ceftriaxone 7/4 > 7/7  Interval History/Subjective  Tachycardic overnight, no intervention made This morning the patient feels well, she has no chest pain or shortness of breath and has no complaints.  She slept fairly well.  No nausea or vomiting.  Objective   Vitals:  Vitals:   03/10/19 0443 03/10/19 0808  BP: (!) 158/64 (!) 149/68  Pulse: (!) 49 (!)  51  Resp: 18 18  Temp: 98.4 F (36.9 C) 98.2 F (36.8 C)  SpO2: 91% 95%    Exam:  Constitutional:   . Appears calm and comfortable lying in bed Eyes:  . pupils and irises appear normal ENMT:  . grossly normal hearing  . Lips appear normal Respiratory:  . CTA bilaterally, no w/r/r.  . Respiratory effort normal.  Cardiovascular:  . RRR, no m/r/g . No LE extremity edema   Abdomen:  . Soft, nontender, nondistended Musculoskeletal:  . RLE, LLE   . strength and tone appear grossly normal Psychiatric:  . Mental status o Mood, affect appropriate . Answers questions appropriately.   I have personally reviewed the following:   Today's Data  . EKG shows atrial fibrillation with rapid ventricular response . Hemoglobin stable at 9.8  Lab Data  . SARS-CoV-2 negative  Micro Data  . Blood cultures no growth 4 days . Urine culture unrevealing, multiple morphotypes, none predominant  Imaging  . CT head abnormality noted.  CT cervical spine no acute issues noted . MRI brain showed acute to subacute left PCA territory infarct with petechial hemorrhage, no malignant hemorrhagic transformation or significant mass-effect.  No evidence of large vessel occlusion.  Cardiology: Cardiology Data  . EKG showed atrial fibrillation with rapid ventricular response  Other Data   Scheduled Meds: . aspirin  300 mg Rectal Daily   Or  . aspirin  325 mg Oral Daily  . atorvastatin  40 mg Oral q1800  . ferrous sulfate  325 mg Oral Q breakfast  . sodium chloride flush  3 mL Intravenous Once   Continuous Infusions: . heparin 850 Units/hr (03/09/19 1224)    Principal Problem:   Acute CVA (cerebrovascular accident) (  [place]) Active Problems:   Atrial fibrillation with rapid ventricular response (HCC)   CAP (community acquired pneumonia)   Hypoglycemia   LOS: 5 days   How to contact the [facility] Attending or Consulting provider [place] or covering provider during after hours [place], for this  patient?  1. Check the care team in [facility] and look for a) attending/consulting TRH provider listed and b) the [facility] team listed 2. Log into [email or web address] and use [place]'s universal password to access. If you do not have the password, please contact the hospital operator. 3. Locate the [facility] provider you are looking for under [facility] and page to a number that you can be directly reached. 4. If you still have difficulty reaching the provider, please page the [facility] (Director on Call) for the Hospitalists listed on amion for assistance.

## 2019-03-10 NOTE — Progress Notes (Signed)
ANTICOAGULATION CONSULT NOTE  Pharmacy Consult for Heparin Indication: atrial fibrillation, acute CVA  Heparin Dosing Weight: 47.6 kg  Labs: Recent Labs    03/08/19 0610 03/09/19 0342 03/10/19 0418  HGB 9.6* 9.3* 9.1*  HCT 30.0* 28.8* 28.8*  PLT 206 215 217  HEPARINUNFRC 0.61 0.38 0.31  CREATININE  --  0.87 0.84    Assessment: 77 yof with hx of afib not on anticoagulation PTA presenting with falls, afib with RVR, subacute ischemic stroke. Pharmacy consulted to dose heparin per stroke protocol.   Heparin level therapeutic  Goal of Therapy:  Heparin level 0.3-0.5 units/ml, no boluses Monitor platelets by anticoagulation protocol: Yes   Plan:  Continue heparin at 850 units / hr Daily heparin level and CBC  Thank you Acey Lav, PharmD  PGY1 Pharmacy Resident [facility] [phone] 03/10/2019, 9:48 AM

## 2019-03-11 LAB — CBC
HCT: 31.1 % — ABNORMAL LOW (ref 36.0–46.0)
Hemoglobin: 9.8 g/dL — ABNORMAL LOW (ref 12.0–15.0)
MCH: 27.4 pg (ref 26.0–34.0)
MCHC: 31.5 g/dL (ref 30.0–36.0)
MCV: 86.9 fL (ref 80.0–100.0)
Platelets: 238 10*3/uL (ref 150–400)
RBC: 3.58 MIL/uL — ABNORMAL LOW (ref 3.87–5.11)
RDW: 26.8 % — ABNORMAL HIGH (ref 11.5–15.5)
WBC: 7 10*3/uL (ref 4.0–10.5)
nRBC: 0.3 % — ABNORMAL HIGH (ref 0.0–0.2)

## 2019-03-11 LAB — GLUCOSE, CAPILLARY
Glucose-Capillary: 144 mg/dL — ABNORMAL HIGH (ref 70–99)
Glucose-Capillary: 131 mg/dL — ABNORMAL HIGH (ref 70–99)
Glucose-Capillary: 128 mg/dL — ABNORMAL HIGH (ref 70–99)
Glucose-Capillary: 109 mg/dL — ABNORMAL HIGH (ref 70–99)

## 2019-03-11 LAB — HEPARIN LEVEL (UNFRACTIONATED): Heparin Unfractionated: 0.34 IU/mL (ref 0.30–0.70)

## 2019-03-11 MED ORDER — DILTIAZEM LOAD VIA INFUSION
10.0000 mg | Freq: Once | INTRAVENOUS | Status: AC
  Administered 2019-03-11: 10 mg via INTRAVENOUS
  Filled 2019-03-11: qty 10

## 2019-03-11 MED ORDER — DILTIAZEM HCL-DEXTROSE 100-5 MG/100ML-% IV SOLN (PREMIX)
5.0000 mg/h | INTRAVENOUS | Status: DC
  Administered 2019-03-11: 10 mg/h via INTRAVENOUS
  Filled 2019-03-11: qty 100

## 2019-03-11 MED ORDER — DILTIAZEM HCL ER COATED BEADS 180 MG PO CP24
180.0000 mg | ORAL_CAPSULE | Freq: Every day | ORAL | Status: DC
  Administered 2019-03-12: 180 mg via ORAL
  Filled 2019-03-11: qty 1

## 2019-03-11 MED ORDER — SODIUM CHLORIDE 0.9 % IV BOLUS
500.0000 mL | Freq: Once | INTRAVENOUS | Status: AC
  Administered 2019-03-11: 500 mL via INTRAVENOUS

## 2019-03-11 NOTE — Progress Notes (Signed)
Pt's HR is fluctuating from 115 to 160's. MD on call has been notified, and new order placed. Will continue monitoring the patient.

## 2019-03-11 NOTE — Progress Notes (Signed)
NP paged about patient heart rate of 160. Awaiting response.

## 2019-03-11 NOTE — Progress Notes (Addendum)
PROGRESS NOTE  Sherry [name] ZOX:[id number] DOB: May 20, 1929 DOA: 03/05/2019 PCP: Darrow BussingKoirala, Dibas, MD  Brief History   85 year old woman PMH COPD, essential hypertension, hyperlipidemia, CAD status post stent, atrial fibrillation not on anticoagulation, presented via EMS for evaluation of fall.  Per chart the patient has had a fall at home most every day for the last 2 weeks prior to presentation.  2 weeks prior to presentation had a closed head injury.  Worsening memory for the last several months to years.  Has been using a walker to ambulate after hip replacement surgery 5 years prior.  PMH includes UTIs.  Multiple abnormalities found.  Admitted for atrial fibrillation with rapid ventricular response, possible pneumonia, possible UTI, CT head suspicious for acute/subacute nonhemorrhagic infarct medial left occipital lobe.  A & P  Subacute left PCA ischemic stroke with petechial hemorrhage, cardioembolic.  Initial exam did not reveal any focal motor deficit but did reveal a right homonymous hemianopsia. MRI brain no large vessel stenosis or occlusion 2D echocardiogram with normal LVEF.  --Continue as per neurology: Aspirin 325 mg daily, apixaban on discharge.    Plavix stopped. --Per speech therapy no needs identified --Physical therapy documented confused, impulsive, decreased safety awareness and poor memory as well as significant vision impairment and suspected depth perception deficits. --SNF recommended per therapy --No new issues  Atrial fibrillation with rapid ventricular response, slow ventricular response.  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  Known atrial fibrillation not on anticoagulation as an outpatient.  On Plavix as an outpatient.  CHA2DS2-VASc 6.  Initially responded to Cardizem infusion and was successfully transitioned to oral therapy. Dr. Rhona Leavenshiu discussed with cardiology, given the patient's high risk for stroke, it was felt that anticoagulation benefit outweighed risk and  recommendation was for 2.5 mg apixaban twice daily; Dr. Rhona Leavenshiu also discussed with neurology who concurred and recommend starting on discharge --Now with rapid ventricular response since about 2 AM this morning.  No ischemic changes on EKG.  Patient is asymptomatic.  Normotensive. --Start diltiazem infusion --I placed cardiology consult twice yesterday and confirmed receipt, but do not see patient was seen. Consult placed again this AM.  Acute encephalopathy thought secondary to stroke --Resolved..  Neurology suspects dementia.  Recurrent falls --Possibly secondary to recent stroke with new visual deficit --Therapy recommends SNF  AKI  --Resolved with IV fluids.    Possible community-acquired pneumonia considered on admission however not hypoxic, afebrile, no leukocytosis. --Treated with ceftriaxone and azithromycin (completed) --Wean off oxygen, down to 1 L  Possible UTI considered on admission however culture was unrevealing. --Empirically treated with ceftriaxone, no further evaluation suggested.  Normocytic anemia --Remains stable.  Follow-up as an outpatient if clinically indicated  Hyperlipidemia --Continue statin  Coronary artery disease status post stent --Plavix held per neurology as patient on aspirin and heparin  Suspected dementia per neurology --Appears stable  Hypoglycemia on admission, resolved.  No lows documented since 7/4 --Monitor.  Moderate to severe tricuspid regurgitation --Asymptomatic, follow-up with cardiology as an outpatient.   SNF when stable, only issue preventing discharges tacky bradycardia.  Was bradycardic all day yesterday, not tachycardic again.  Start diltiazem.  Cardiology evaluation today.    Attempted again to contact daughter last evening, no answer.  Will call again today to discuss current condition as well as anticoagulation.  DVT prophylaxis: heparin Code Status: Full Family Communication: I spoke with the patient's daughter at  length, reviewed hospitalization, diagnoses and current issues, primarily in regard to rhythm.  We also discussed the risk/benefit of  anticoagulation, especially in light of the patient's known high fall risk and I did also shared with her the exact quote of physical therapy's risk assessment. Webb Silversmith understands the risks and benefits and wishes to proceed with anticoagulation on discharge. Disposition Plan: SNF    Murray Hodgkins, MD  [facility] Direct contact: see [email or web address] (further directions at bottom of note if needed) 7PM-7AM contact night coverage as at bottom of note 03/11/2019, 5:03 PM  LOS: 6 days   Consultants  . Neurology  Procedures  . Echo IMPRESSIONS    1. The left ventricle has normal systolic function with an ejection fraction of 60-65%. The cavity size was normal. There is mildly increased left ventricular wall thickness. Left ventricular diastolic function could not be evaluated. Elevated mean left  atrial pressure.  2. The right ventricle has normal systolic function. The cavity was normal. Right ventricular systolic pressure is mildly elevated.  3. Left atrial size was severely dilated.  4. Right atrial size was severely dilated.  5. The mitral valve is abnormal. Mild thickening of the mitral valve leaflet. There is severe mitral annular calcification present.  6. The tricuspid valve is grossly normal. Tricuspid valve regurgitation is moderate-severe.  7. The aortic valve is tricuspid. Mild thickening of the aortic valve. Aortic valve regurgitation is mild by color flow Doppler. No stenosis of the aortic valve.  8. The inferior vena cava was dilated in size with <50% respiratory variability.  9. Mild apical hypokinesis with overall preserved LV function; mild LVH; severe biatrial enlargement; milld AI; moderate to severe TR; RVSP mildly elevated.  Antibiotics  . Azithromycin 7/4 > 7/7 . Ceftriaxone 7/4 > 7/7  Interval History/Subjective  Tachycardic  overnight, no intervention made This morning the patient feels well, she has no chest pain or shortness of breath and has no complaints.  She slept fairly well.  No nausea or vomiting.  Objective   Vitals:  Vitals:   03/11/19 1150 03/11/19 1644  BP: 110/70 (!) 146/121  Pulse: (!) 110 (!) 152  Resp: 18 14  Temp: 98.8 F (37.1 C) 98 F (36.7 C)  SpO2: 90% 95%    Exam:  Constitutional:   . Appears calm and comfortable lying in bed Eyes:  . pupils and irises appear normal ENMT:  . grossly normal hearing  . Lips appear normal Respiratory:  . CTA bilaterally, no w/r/r.  . Respiratory effort normal.  Cardiovascular:  . RRR, no m/r/g . No LE extremity edema   Abdomen:  . Soft, nontender, nondistended Musculoskeletal:  . RLE, LLE   . strength and tone appear grossly normal Psychiatric:  . Mental status o Mood, affect appropriate . Answers questions appropriately.   I have personally reviewed the following:   Today's Data  . EKG shows atrial fibrillation with rapid ventricular response . Hemoglobin stable at 9.8  Lab Data  . SARS-CoV-2 negative  Micro Data  . Blood cultures no growth 4 days . Urine culture unrevealing, multiple morphotypes, none predominant  Imaging  . CT head abnormality noted.  CT cervical spine no acute issues noted . MRI brain showed acute to subacute left PCA territory infarct with petechial hemorrhage, no malignant hemorrhagic transformation or significant mass-effect.  No evidence of large vessel occlusion.  Cardiology: Cardiology Data  . EKG showed atrial fibrillation with rapid ventricular response  Other Data   Scheduled Meds: . aspirin  300 mg Rectal Daily   Or  . aspirin  325 mg Oral Daily  . atorvastatin  40 mg Oral q1800  . diltiazem  180 mg Oral Daily  . ferrous sulfate  325 mg Oral Q breakfast  . sodium chloride flush  3 mL Intravenous Once   Continuous Infusions: . heparin 850 Units/hr (03/10/19 1708)    Principal  Problem:   Acute CVA (cerebrovascular accident) (HCC) Active Problems:   Atrial fibrillation with rapid ventricular response (HCC)   CAP (community acquired pneumonia)   Hypoglycemia   LOS: 6 days   How to contact the [facility] Attending or Consulting provider 7A - 7P or covering provider during after hours 7P -7A, for this patient?  1. Check the care team in [facility] and look for a) attending/consulting TRH provider listed and b) the [facility] team listed 2. Log into [email or web address] and use [place]'s universal password to access. If you do not have the password, please contact the hospital operator. 3. Locate the [facility] provider you are looking for under [facility] and page to a number that you can be directly reached. 4. If you still have difficulty reaching the provider, please page the [facility] (Director on Call) for the Hospitalists listed on amion for assistance.

## 2019-03-11 NOTE — Care Management Important Message (Signed)
Important Message  Patient Details  Name: Sherry [name] MRN: [id number] Date of Birth: 1928/12/12   Medicare Important Message Given:  Yes     Orbie Pyo 03/11/2019, 3:28 PM

## 2019-03-11 NOTE — TOC Progression Note (Signed)
Transition of Care [facility]) - Progression Note    Patient Details  Name: Sherry [name] MRN: [id number] Date of Birth: [birth year]  Transition of Care [facility]) CM/SW Contact  Pollie Friar, RN Phone Number: 03/11/2019, 4:09 PM  Clinical Narrative:    [name] has authorization for a SNF rehab stay. If patient continues to need to be in the hospital into next week pt may need new auth prior to d/c. TOC following.   Expected Discharge Plan: [name] Barriers to Discharge: Continued Medical Work up  Expected Discharge Plan and Services Expected Discharge Plan: [name] In-house Referral: Clinical Social Work Discharge Planning Services: CM Consult Post Acute Care Choice: [place] Living arrangements for the past 2 months: Mobile Home                                       Social Determinants of Health (SDOH) Interventions    Readmission Risk Interventions No flowsheet data found.

## 2019-03-11 NOTE — Progress Notes (Addendum)
Progress Note  Patient Name: Sherry [name] Date of Encounter: 03/11/2019  Primary Cardiologist: none  Subjective   She is oriented to self, knows she is in the hospital (not which one), not to time, situation. She c/o of achy bones, denies CP or SOB  Inpatient Medications    Scheduled Meds: . aspirin  300 mg Rectal Daily   Or  . aspirin  325 mg Oral Daily  . atorvastatin  40 mg Oral q1800  . ferrous sulfate  325 mg Oral Q breakfast  . sodium chloride flush  3 mL Intravenous Once   Continuous Infusions: . diltiazem (CARDIZEM) infusion 10 mg/hr (03/11/19 1036)  . heparin 850 Units/hr (03/10/19 1708)   PRN Meds: acetaminophen **OR** acetaminophen (TYLENOL) oral liquid 160 mg/5 mL **OR** acetaminophen, senna-docusate   Vital Signs    Vitals:   03/11/19 1055 03/11/19 1111 03/11/19 1122 03/11/19 1150  BP: 140/82   110/70  Pulse: (!) 130 (!) 140 (!) 114 (!) 110  Resp:  20 18 18   Temp: 98.6 F (37 C) 98.6 F (37 C) 98.6 F (37 C) 98.8 F (37.1 C)  TempSrc: Oral Oral Oral Oral  SpO2: 98% 90% 90% 90%  Weight:      Height:        Intake/Output Summary (Last 24 hours) at 03/11/2019 1347 Last data filed at 03/11/2019 0512 Gross per 24 hour  Intake 368.43 ml  Output 900 ml  Net -531.57 ml   Last 3 Weights 03/05/2019 10/13/2017 10/12/2017  Weight (lbs) 105 lb 110 lb 3.2 oz 112 lb 6.4 oz  Weight (kg) 47.628 kg 49.986 kg 50.984 kg      Telemetry    AFib 120's currently - Personally Reviewed  ECG    Today is Afib 131, no ischemic changes appreciated 03/10/2019 Junctional rhythm, 50bpm, QRS 84ms, no STT changes  10/12/2018 SR68bpm, no ST/T changes   Personally Reviewed  Physical Exam   GEN: No acute distress.   Neck: No JVD Cardiac: irreg-irreg, tachycardic, no murmurs, rubs, or gallops.  Respiratory: CTA b/l. GI: Soft, nontender, non-distended  MS: No edema; advance/perhaps appropriate atrophy for advanged age Neuro:  no clear focal gross motor deficits  Oriented to self only Knows she is in the hospital, though not which one or why) Tells me is is [id number] Tells me Danae OrleansBush is president   Psych: very pleasent  Labs    High Sensitivity Troponin:  No results for input(s): TROPONINIHS in the last 720 hours.    Cardiac EnzymesNo results for input(s): TROPONINI in the last 168 hours. No results for input(s): TROPIPOC in the last 168 hours.   Chemistry Recent Labs  Lab 03/05/19 1720  03/07/19 0445 03/09/19 0342 03/10/19 0418  NA 134*   < > 138 138 139  K 3.5   < > 3.8 3.3* 4.8  CL 105   < > 111 114* 114*  CO2 17*   < > 17* 16* 18*  GLUCOSE 69*   < > 97 97 91  BUN 28*   < > 22 13 10   CREATININE 1.45*   < > 1.07* 0.87 0.84  CALCIUM 8.7*   < > 8.0* 8.0* 8.5*  PROT 6.0*  --   --   --   --   ALBUMIN 3.2*  --   --   --   --   AST 43*  --   --   --   --   ALT 16  --   --   --   --  ALKPHOS 58  --   --   --   --   BILITOT 0.9  --   --   --   --   GFRNONAA 32*   < > 47* 60* >60  GFRAA 37*   < > 54* >60 >60  ANIONGAP 12   < > 10 8 7    < > = values in this interval not displayed.     Hematology Recent Labs  Lab 03/09/19 0342 03/10/19 0418 03/11/19 0316  WBC 6.0 5.9 7.0  RBC 3.44* 3.39* 3.58*  HGB 9.3* 9.1* 9.8*  HCT 28.8* 28.8* 31.1*  MCV 83.7 85.0 86.9  MCH 27.0 26.8 27.4  MCHC 32.3 31.6 31.5  RDW 25.3* 26.0* 26.8*  PLT 215 217 238    BNPNo results for input(s): BNP, PROBNP in the last 168 hours.   DDimer No results for input(s): DDIMER in the last 168 hours.   Radiology    No results found.  Cardiac Studies   03/06/2019: TTE IMPRESSIONS 1. The left ventricle has normal systolic function with an ejection fraction of 60-65%. The cavity size was normal. There is mildly increased left ventricular wall thickness. Left ventricular diastolic function could not be evaluated. Elevated mean left atrial pressure. 2. The right ventricle has normal systolic function. The cavity was normal. Right ventricular systolic pressure is  mildly elevated. 3. Left atrial size was severely dilated. 4. Right atrial size was severely dilated. 5. The mitral valve is abnormal. Mild thickening of the mitral valve leaflet. There is severe mitral annular calcification present. 6. The tricuspid valve is grossly normal. Tricuspid valve regurgitation is moderate-severe. 7. The aortic valve is tricuspid. Mild thickening of the aortic valve. Aortic valve regurgitation is mild by color flow Doppler. No stenosis of the aortic valve. 8. The inferior vena cava was dilated in size with <50% respiratory variability. 9. Mild apical hypokinesis with overall preserved LV function; mild LVH; severe biatrial enlargement; milld AI; moderate to severe TR; RVSP mildly elevated.  Patient Profile     85 y.o. female hx of COPD, HTN, HLD, CAD, recurrent UTIs, some degree of what sounds like baseline dementia with waxing/waning mental status and memory deficits, and AFib, not previously on a/c.  Ad[facility]/2019 with sudden up-tick of falls at home in the last couple weeks to daily occurrence, and a finding of stroke.  She was also again noted in fast AFib. She has subsequently with neurology looks like planned to be started on Eliquis for a/c.  Neurology team has signed off case.  Initially she was treated with dilt gtt for rate control.  She was suspect as well to have UTI, though culture unrevealing and treated empirically, also some concern for pneumonia, also treated with antibiotics empirically.  No fever or leukocytosis is noted.  Today IM note on telemetry a >5 second pause with PO dilt on board, and was discontinued, we are called to weigh in on possible tachy-brady.    EP saw the patient 03/08/18, no PPM indicated with nocturnal bradycardia, her dilt restarted at 180mg  daily.  Assessment & Plan    EP is asked to revisit with AFib RVR, was started on dilt gtt this AM  1. Afib, paroxysmal     CHA2DS2Vasc is 7, heparin gtt here (note  neurology mentioned Eliquis)     OAC agent and timing is deferred to neurology/attg services  Echo describes her as having severe bi-atrial enlargement, (though LA measured at 4mm)will make longterm rhythm control  strategies difficult/less likely to be successful  Longer pauses (4-5 seconds) have been nocturnal only She has had day-time/awake bradycardia 40's, at times appears sinus/low atrial, others perhaps is a junctional rhythm, with escape rate high 40's, narrow complex  She has tachy-brady, though unclear how symptomatic if at all she is with brady or tachy. BP has been stable She did not get any dilt yesterday She is oriented only to self  D/w Dr. Ladona Ridgelaylor, will see her later this afternoon, no pacer at this juncture  Convert dilt gtt to PO and follow  For questions or updates, please contact [facility] Please consult [email or web address] for contact info under   Signed, Sheilah PigeonRenee Lynn Ursuy, PA-C  03/11/2019, 1:47 PM    EP Attending  Patient seen and examined. Agree with above. The patient continues to have atrial fib with a RVR and nocturnal pauses for which she is asymptomatic. She is pleasantly demented and has no specific complaints except that she feels bad all over. She is disoriented as documented above. She needs oral long acting diltiazem as I have recommended. She has no symptoms and her long pauses are at nightime. Her combination of dementia and failure to thrive and lack of symptoms make PPM insertion contra-indicated. She is a very poor candidate at best for PPM. Consider discontinuing telemetry.  Leonia ReevesGregg Ameir Faria,M.D.

## 2019-03-11 NOTE — Progress Notes (Signed)
Physical Therapy Treatment Patient Details Name: Sherry [name] MRN: [id number] DOB: 1929-05-21 Today's Date: 03/11/2019    History of Present Illness Sherry [name] is a 85 y.o. female presenting to the hospital via EMS for evaluation of worsening falls. with medical history significant of COPD, hypertension, hyperlipidemia, CAD status post stent, A. fib not on anticoagulation. Negative CT head. C-Collar in room     PT Comments    Patient seen for mobility progression. Pt continues to c/o pain "all over" and is deconditioned. Pt fatigued after transfer OOB. Continue to progress as tolerated with anticipated d/c to SNF for further skilled PT services.     Follow Up Recommendations  SNF;Supervision/Assistance - 24 hour     Equipment Recommendations  None recommended by PT    Recommendations for Other Services       Precautions / Restrictions Precautions Precautions: Fall    Mobility  Bed Mobility Overal bed mobility: Needs Assistance Bed Mobility: Supine to Sit     Supine to sit: Mod assist     General bed mobility comments: assist to elevate trunk into sitting and scoot hips to EOB   Transfers Overall transfer level: Needs assistance Equipment used: Rolling walker (2 wheeled) Transfers: Sit to/from [employer]GI CorporationStand;Stand Pivot Transfers Sit to Stand: Mod assist Stand pivot transfers: Mod assist;Min assist       General transfer comment: assist to power up into standing and then for balance when taking pivotal steps to recliner   Ambulation/Gait             General Gait Details: gait deferred for safety; pt fatigued after stand pivot    Stairs             Wheelchair Mobility    Modified Rankin (Stroke Patients Only) Modified Rankin (Stroke Patients Only) Pre-Morbid Rankin Score: Moderate disability Modified Rankin: Moderately severe disability     Balance Overall balance assessment: Needs assistance Sitting-balance support: Bilateral upper extremity  supported;Feet supported Sitting balance-Leahy Scale: Fair     Standing balance support: Bilateral upper extremity supported;During functional activity Standing balance-Leahy Scale: Poor                              Cognition Arousal/Alertness: Awake/alert Behavior During Therapy: WFL for tasks assessed/performed Overall Cognitive Status: History of cognitive impairments - at baseline                                        Exercises      General Comments        Pertinent Vitals/Pain Pain Assessment: Faces Faces Pain Scale: Hurts little more Pain Location: back, hips, "all over" Pain Descriptors / Indicators: Aching;Sore Pain Intervention(s): Limited activity within patient's tolerance;Monitored during session;Repositioned    Home Living                      Prior Function            PT Goals (current goals can now be found in the care plan section) Acute Rehab PT Goals Patient Stated Goal: to go home Progress towards PT goals: Progressing toward goals    Frequency    Min 3X/week      PT Plan Current plan remains appropriate    Co-evaluation              AM-PAC  PT "6 Clicks" Mobility   Outcome Measure  Help needed turning from your back to your side while in a flat bed without using bedrails?: A Lot Help needed moving from lying on your back to sitting on the side of a flat bed without using bedrails?: A Lot Help needed moving to and from a bed to a chair (including a wheelchair)?: A Lot Help needed standing up from a chair using your arms (e.g., wheelchair or bedside chair)?: A Lot Help needed to walk in hospital room?: A Lot Help needed climbing 3-5 steps with a railing? : Total 6 Click Score: 11    End of Session Equipment Utilized During Treatment: Gait belt Activity Tolerance: Patient limited by fatigue;Patient limited by pain Patient left: in chair;with call bell/phone within reach;with chair alarm  set Nurse Communication: Mobility status PT Visit Diagnosis: Other abnormalities of gait and mobility (R26.89);Difficulty in walking, not elsewhere classified (R26.2)     Time: 4383-8184 PT Time Calculation (min) (ACUTE ONLY): 35 min  Charges:  $Gait Training: 8-22 mins $Therapeutic Activity: 8-22 mins                     Earney Navy, PTA Acute Rehabilitation Services Pager: [phone] Office: [phone]     Darliss Cheney 03/11/2019, 4:43 PM

## 2019-03-11 NOTE — Progress Notes (Signed)
ANTICOAGULATION CONSULT NOTE  Pharmacy Consult for Heparin Indication: atrial fibrillation, acute CVA  Heparin Dosing Weight: 47.6 kg  Labs: Recent Labs    03/09/19 0342 03/10/19 0418 03/11/19 0316  HGB 9.3* 9.1* 9.8*  HCT 28.8* 28.8* 31.1*  PLT 215 217 238  HEPARINUNFRC 0.38 0.31 0.34  CREATININE 0.87 0.84  --     Assessment: 49 yof with hx of afib not on anticoagulation PTA presenting with falls, afib with RVR, subacute ischemic stroke. Pharmacy consulted to dose heparin per stroke protocol.   Heparin level therapeutic (0.34)  Goal of Therapy:  Heparin level 0.3-0.5 units/ml, no boluses Monitor platelets by anticoagulation protocol: Yes   Plan:  Continue heparin at 850 units / hr Daily heparin level and CBC  Thank you  Acey Lav, PharmD  PGY1 Pharmacy Resident [facility] [phone] 03/11/2019, 8:08 AM

## 2019-03-11 NOTE — Progress Notes (Signed)
PROGRESS NOTE  Sherry [name] ZOX:[id number] DOB: [birth year] DOA: 03/05/2019 PCP: Darrow BussingKoirala, Dibas, MD  Brief History   85 year old woman PMH COPD, essential hypertension, hyperlipidemia, CAD status post stent, atrial fibrillation not on anticoagulation, presented via EMS for evaluation of fall.  Per chart the patient has had a fall at home most every day for the last 2 weeks prior to presentation.  2 weeks prior to presentation had a closed head injury.  Worsening memory for the last several months to years.  Has been using a walker to ambulate after hip replacement surgery 5 years prior.  PMH includes UTIs.  Multiple abnormalities found.  Admitted for atrial fibrillation with rapid ventricular response, possible pneumonia, possible UTI, CT head suspicious for acute/subacute nonhemorrhagic infarct medial left occipital lobe.  A & P  Subacute left PCA ischemic stroke with petechial hemorrhage, cardioembolic.  Initial exam did not reveal any focal motor deficit but did reveal a right homonymous hemianopsia. MRI brain no large vessel stenosis or occlusion 2D echocardiogram with normal LVEF.  --Neurology recommended aspirin 325 mg daily, apixaban on discharge.    Plavix stopped. --Per speech therapy no needs identified --Physical therapy documented confused, impulsive, decreased safety awareness and poor memory as well as significant vision impairment and suspected depth perception deficits. --SNF recommended per therapy --Will discuss with daughter Thurston Holenne today  Atrial fibrillation with rapid ventricular response, slow ventricular response.  Known atrial fibrillation not on anticoagulation as an outpatient.  On Plavix as an outpatient.  CHA2DS2-VASc 6.  Initially responded to Cardizem infusion and was successfully transitioned to oral therapy. Dr. Rhona Leavenshiu discussed with cardiology, given the patient's high risk for stroke, it was felt that anticoagulation benefit outweighed risk and recommendation was for  2.5 mg apixaban twice daily; Dr. Rhona Leavenshiu also discussed with neurology who concurred and recommend starting on discharge - diltiazem on hold, cardiology note reviewed, it should be noted that the patient had a 5-second pause during the day while awake.  Cardiology suggested changing Cardizem dose to 180 mg daily (previously receiving 60 mg every 6).  Cardiology signed off. --Patient not currently on any rate control and remains bradycardic, last dose of rate control agent greater than 24 hours, therefore will not restart diltiazem (discontinued yesterday, last dose now 24 hours ago 7/8 at 0525).  Will ask cardiology for further recommendations given now with slow ventricular response.  EKG appears to be junctional rhythm.  Acute encephalopathy thought secondary to stroke --Resolved.  Neurology suspects dementia.  Recurrent falls --Possibly secondary to recent stroke with new visual deficit --Therapy recommends SNF  AKI  --Resolved with IV fluids.    Possible community-acquired pneumonia considered on admission however not hypoxic, afebrile, no leukocytosis. --Treated with ceftriaxone and azithromycin (completed) --Wean off oxygen  Possible UTI considered on admission however culture was unrevealing. --Empirically treated with ceftriaxone, no further evaluation suggested.  Normocytic anemia --Remained stable.  Follow-up as an outpatient if clinically indicated  Hyperlipidemia --Continue statin  Coronary artery disease status post stent --Plavix held per neurology as patient on aspirin and heparin  Suspected dementia per neurology --Appears stable  Hypoglycemia on admission, resolved.  No lows documented since 7/4 --Monitor.  Moderate to severe tricuspid regurgitation --Asymptomatic, follow-up with cardiology as an outpatient.   SNF needed when stable.  Only issue at this point is tacky/bradycardia.  Currently asymptomatic bradycardia, appears to have a junctional  rhythm off any rate control agent.  Electrolytes within normal limits.  Will ask cardiology to re-eval for any further  recommendations and will not start Cardizem at this time  Will discuss risk/benefit of anticoagulation with daughter today   Note: this is a copy of the note I filed 7/9 for my visit 7/9. I subsequently addended this note 7/10 by accident.   DVT prophylaxis: heparin Code Status: Full Family Communication: Attempted yesterday, daughter did not answer.  Will call today. Disposition Plan: SNF    Brendia Sacksaniel , MD         [facility] Direct contact: see [email or web address] (further directions at bottom of note if needed) 7PM-7AM contact night coverage as at bottom of note 03/10/2019, 9:00 AM  LOS: 5 days   Consultants   Neurology  Procedures   Echo IMPRESSIONS   1. The left ventricle has normal systolic function with an ejection fraction of 60-65%. The cavity size was normal. There is mildly increased left ventricular wall thickness. Left ventricular diastolic function could not be evaluated. Elevated mean left atrial pressure. 2. The right ventricle has normal systolic function. The cavity was normal. Right ventricular systolic pressure is mildly elevated. 3. Left atrial size was severely dilated. 4. Right atrial size was severely dilated. 5. The mitral valve is abnormal. Mild thickening of the mitral valve leaflet. There is severe mitral annular calcification present. 6. The tricuspid valve is grossly normal. Tricuspid valve regurgitation is moderate-severe. 7. The aortic valve is tricuspid. Mild thickening of the aortic valve. Aortic valve regurgitation is mild by color flow Doppler. No stenosis of the aortic valve. 8. The inferior vena cava was dilated in size with <50% respiratory variability. 9. Mild apical hypokinesis with overall preserved LV function; mild LVH; severe biatrial enlargement; milld AI; moderate to severe TR; RVSP mildly  elevated.  Antibiotics   Azithromycin 7/4 > 7/7  Ceftriaxone 7/4 > 7/7  Interval History/Subjective  Sinus bradycardia this morning 40-50s.  3.97-second pause charted overnight.  Question of second-degree block charted overnight, type not indicated in charting. Cardiology recommended no intervention, continue Cardizem and signed off same day Patient eating breakfast, denies chest pain, denies shortness of breath, no complaints.  She does not normally wear oxygen.  Objective   Vitals:      Vitals:   03/10/19 0443 03/10/19 0808  BP: (!) 158/64 (!) 149/68  Pulse: (!) 49 (!) 51  Resp: 18 18  Temp: 98.4 F (36.9 C) 98.2 F (36.8 C)  SpO2: 91% 95%    Exam:  Constitutional:    Appears calm and comfortable eating pancakes ENMT:   grossly normal hearing   Lips appear normal Respiratory:   CTA bilaterally, no w/r/r.   Respiratory effort normal. Cardiovascular:   RRR, no m/r/g  No LE extremity edema    Telemetry sinus bradycardia, marked sinus bradycardia with rate 40-50s.  Pause 3.97 seconds overnight. Musculoskeletal:   Digits/nails BUE: no clubbing, cyanosis, petechiae, infection  RUE, LUE, RLE, LLE    Grossly normal tone, strength in all extremities Psychiatric:   Mental status ? Mood, affect appropriate  judgment and insight appear intact   I have personally reviewed the following:   Today's Data   CBG stable  Magnesium 2.2, potassium 4.8  Hemoglobin stable at 9.1  Lab Data   SARS-CoV-2 negative  Micro Data   Blood cultures no growth 4 days  Urine culture unrevealing, multiple morphotypes, none predominant  Imaging   CT head abnormality noted.  CT cervical spine no acute issues noted  MRI brain showed acute to subacute left PCA territory infarct with petechial hemorrhage, no malignant hemorrhagic  transformation or significant mass-effect.  No evidence of large vessel occlusion.  Cardiology: Cardiology Data   EKG  showed atrial fibrillation with rapid ventricular response  Other Data   Scheduled Meds: . aspirin  300 mg Rectal Daily   Or  . aspirin  325 mg Oral Daily  . atorvastatin  40 mg Oral q1800  . ferrous sulfate  325 mg Oral Q breakfast  . sodium chloride flush  3 mL Intravenous Once   Continuous Infusions: . heparin 850 Units/hr (03/09/19 1224)    Principal Problem:   Acute CVA (cerebrovascular accident) ([place]) Active Problems:   Atrial fibrillation with rapid ventricular response ([place])   CAP (community acquired pneumonia)   Hypoglycemia   LOS: 5 days   How to contact the [facility] Attending or Consulting provider [place] or covering provider during after hours [place], for this patient? 1. Check the care team in [facility] and look for a) attending/consulting TRH provider listed and b) the [facility] team listed 2. Log into [email or web address] and use [place]'s universal password to access. If you do not have the password, please contact the hospital operator. 3. Locate the [facility] provider you are looking for under [facility] and page to a number that you can be directly reached. 4. If you still have difficulty reaching the provider, please page the [facility] (Director on Call) for the Hospitalists listed on amion for assistance.

## 2019-03-11 NOTE — Progress Notes (Signed)
MD on call has been notified of elevated HR via Amion. Waiting for response

## 2019-03-11 NOTE — Progress Notes (Signed)
Paged MD on call again for HR in the 160's, 170's, still waiting for a response

## 2019-03-11 NOTE — Progress Notes (Signed)
Paged MD Sarajane Jews about heart rate 140 to 170

## 2019-03-12 DIAGNOSIS — I639 Cerebral infarction, unspecified: Secondary | ICD-10-CM

## 2019-03-12 LAB — CBC
HCT: 31.4 % — ABNORMAL LOW (ref 36.0–46.0)
Hemoglobin: 10 g/dL — ABNORMAL LOW (ref 12.0–15.0)
MCH: 27.6 pg (ref 26.0–34.0)
MCHC: 31.8 g/dL (ref 30.0–36.0)
MCV: 86.7 fL (ref 80.0–100.0)
Platelets: 262 10*3/uL (ref 150–400)
RBC: 3.62 MIL/uL — ABNORMAL LOW (ref 3.87–5.11)
RDW: 27.1 % — ABNORMAL HIGH (ref 11.5–15.5)
WBC: 6.1 10*3/uL (ref 4.0–10.5)
nRBC: 0 % (ref 0.0–0.2)

## 2019-03-12 LAB — GLUCOSE, CAPILLARY
Glucose-Capillary: 80 mg/dL (ref 70–99)
Glucose-Capillary: 75 mg/dL (ref 70–99)
Glucose-Capillary: 108 mg/dL — ABNORMAL HIGH (ref 70–99)
Glucose-Capillary: 123 mg/dL — ABNORMAL HIGH (ref 70–99)
Glucose-Capillary: 106 mg/dL — ABNORMAL HIGH (ref 70–99)

## 2019-03-12 LAB — HEPARIN LEVEL (UNFRACTIONATED): Heparin Unfractionated: 0.28 IU/mL — ABNORMAL LOW (ref 0.30–0.70)

## 2019-03-12 MED ORDER — APIXABAN 2.5 MG PO TABS
2.5000 mg | ORAL_TABLET | Freq: Two times a day (BID) | ORAL | Status: DC
  Administered 2019-03-12 – 2019-03-15 (×6): 2.5 mg via ORAL
  Filled 2019-03-12 (×6): qty 1

## 2019-03-12 MED ORDER — DILTIAZEM HCL ER COATED BEADS 240 MG PO CP24
240.0000 mg | ORAL_CAPSULE | Freq: Every day | ORAL | Status: DC
  Administered 2019-03-13 – 2019-03-15 (×3): 240 mg via ORAL
  Filled 2019-03-12 (×3): qty 1

## 2019-03-12 MED ORDER — DILTIAZEM HCL 25 MG/5ML IV SOLN
15.0000 mg | Freq: Once | INTRAVENOUS | Status: AC
  Administered 2019-03-12: 15 mg via INTRAVENOUS
  Filled 2019-03-12: qty 5

## 2019-03-12 MED ORDER — DILTIAZEM HCL 30 MG PO TABS
60.0000 mg | ORAL_TABLET | Freq: Once | ORAL | Status: AC
  Administered 2019-03-12: 60 mg via ORAL
  Filled 2019-03-12: qty 2

## 2019-03-12 MED ORDER — DILTIAZEM LOAD VIA INFUSION: 15.0000 mg | Freq: Once | INTRAVENOUS | Status: DC

## 2019-03-12 NOTE — Progress Notes (Signed)
NP paged about patient heart rate of 170, awaiting response

## 2019-03-12 NOTE — Progress Notes (Signed)
PROGRESS NOTE  Sherry [name] [place] ZOX:[id number] DOB: 01-15-1929 DOA: 03/05/2019 PCP: Darrow BussingKoirala, Dibas, MD  Brief History   85 year old woman PMH COPD, essential hypertension, hyperlipidemia, CAD status post stent, atrial fibrillation not on anticoagulation, presented via EMS for evaluation of fall.  Per chart the patient has had a fall at home most every day for the last 2 weeks prior to presentation.  2 weeks prior to presentation had a closed head injury.  Worsening memory for the last several months to years.  Has been using a walker to ambulate after hip replacement surgery 5 years prior.  PMH includes UTIs.  Multiple abnormalities found.  Admitted for atrial fibrillation with rapid ventricular response, possible pneumonia, possible UTI, CT head suspicious for acute/subacute nonhemorrhagic infarct medial left occipital lobe.  A & P  Subacute left PCA ischemic stroke with petechial hemorrhage, cardioembolic.  Initial exam did not reveal any focal motor deficit but did reveal a right homonymous hemianopsia. MRI brain no large vessel stenosis or occlusion 2D echocardiogram with normal LVEF.  --Continue as per neurology: Aspirin 325 mg daily, apixaban on discharge.    Plavix stopped. --Per speech therapy no needs identified --Physical therapy documented confused, impulsive, decreased safety awareness and poor memory as well as significant vision impairment and suspected depth perception deficits.  I discussed this in detail with the daughter, she is aware of the risk/benefit of anticoagulation, especially in the setting of high fall risk, however she wishes to proceed with anticoagulation. --SNF recommended per therapy --No new issues  Atrial fibrillation with rapid ventricular response, slow ventricular response.  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  Known atrial fibrillation not on anticoagulation as an outpatient.  On Plavix as an outpatient.  CHA2DS2-VASc 6.  Initially responded to Cardizem infusion  and was successfully transitioned to oral therapy. Dr. Rhona Leavenshiu discussed with cardiology, given the patient's high risk for stroke, it was felt that anticoagulation benefit outweighed risk and recommendation was for 2.5 mg apixaban twice daily; Dr. Rhona Leavenshiu also discussed with neurology who concurred and recommend starting on discharge --Seen by electrophysiology.  Recommendation was for diltiazem and stop telemetry.  No pacemaker recommended. --Start apixaban, stop aspirin  Acute encephalopathy thought secondary to stroke --Resolved. Neurology suspects dementia.  Recurrent falls --Possibly secondary to recent stroke with new visual deficit --Therapy recommended SNF  AKI  --Resolved with IV fluids.    Possible community-acquired pneumonia considered on admission however not hypoxic, afebrile, no leukocytosis. --Treated with ceftriaxone and azithromycin (completed) --Wean oxygen  Possible UTI considered on admission however culture was unrevealing. --Empirically treated with ceftriaxone, no further evaluation suggested.  Normocytic anemia --Remains stable.  Follow-up as an outpatient if clinically indicated  Hyperlipidemia --Continue statin  Coronary artery disease status post stent --Plavix held per neurology as patient on aspirin and heparin  Suspected dementia per neurology --Appears stable  Hypoglycemia on admission, resolved.  No lows documented since 7/4 --Monitor.  Moderate to severe tricuspid regurgitation --Asymptomatic, follow-up with cardiology as an outpatient.   Will give one-time dose of short acting Cardizem and increase long-acting dose tomorrow.  She is still labile and tachycardic and will continue telemetry until heart rate is better controlled.  Update COVID testing  DVT prophylaxis: Apixaban Code Status: Full Family Communication: Discussed with daughter by telephone, she is in agreement with treatment plan, no pacemaker, hopeful for discharge to skilled  nursing facility Monday, she is in agreement. Disposition Plan: SNF    Brendia Sacksaniel Goodrich, MD  [facility] Direct contact: see [email or web address] (further directions  at bottom of note if needed) 7PM-7AM contact night coverage as at bottom of note 03/12/2019, 3:27 PM  LOS: 7 days   Consultants  . Neurology  Procedures  . Echo IMPRESSIONS    1. The left ventricle has normal systolic function with an ejection fraction of 60-65%. The cavity size was normal. There is mildly increased left ventricular wall thickness. Left ventricular diastolic function could not be evaluated. Elevated mean left  atrial pressure.  2. The right ventricle has normal systolic function. The cavity was normal. Right ventricular systolic pressure is mildly elevated.  3. Left atrial size was severely dilated.  4. Right atrial size was severely dilated.  5. The mitral valve is abnormal. Mild thickening of the mitral valve leaflet. There is severe mitral annular calcification present.  6. The tricuspid valve is grossly normal. Tricuspid valve regurgitation is moderate-severe.  7. The aortic valve is tricuspid. Mild thickening of the aortic valve. Aortic valve regurgitation is mild by color flow Doppler. No stenosis of the aortic valve.  8. The inferior vena cava was dilated in size with <50% respiratory variability.  9. Mild apical hypokinesis with overall preserved LV function; mild LVH; severe biatrial enlargement; milld AI; moderate to severe TR; RVSP mildly elevated.  Antibiotics  . Azithromycin 7/4 > 7/7 . Ceftriaxone 7/4 > 7/7  Interval History/Subjective  Frustrated by recent stroke.  No chest pain or shortness of breath.  Objective   Vitals:  Vitals:   03/12/19 0739 03/12/19 1155  BP: (!) 146/79 (!) 130/99  Pulse: 66 91  Resp:  15  Temp: 98.1 F (36.7 C) 98 F (36.7 C)  SpO2: 95% 95%    Exam:  Constitutional:   . Appears calm and comfortable eating lunch Respiratory:  . CTA bilaterally, no  w/r/r.  . Respiratory effort normal.  Cardiovascular:  . Irregular, tachycardic, no m/r/g . Telemetry shows atrial fibrillation with heart rate 110-140s . No LE extremity edema   Psychiatric:  . Mental status o Mood, affect appropriate  I have personally reviewed the following:   Today's Data  . Hemoglobin stable at 10.0.  Lab Data  . SARS-CoV-2 negative  Micro Data  . Blood cultures no growth 4 days . Urine culture unrevealing, multiple morphotypes, none predominant  Imaging  . CT head abnormality noted.  CT cervical spine no acute issues noted . MRI brain showed acute to subacute left PCA territory infarct with petechial hemorrhage, no malignant hemorrhagic transformation or significant mass-effect.  No evidence of large vessel occlusion.  Cardiology: Cardiology Data  . EKG showed atrial fibrillation with rapid ventricular response  Other Data   Scheduled Meds: . aspirin  300 mg Rectal Daily   Or  . aspirin  325 mg Oral Daily  . atorvastatin  40 mg Oral q1800  . [START ON 03/13/2019] diltiazem  240 mg Oral Daily  . diltiazem  60 mg Oral Once  . ferrous sulfate  325 mg Oral Q breakfast  . sodium chloride flush  3 mL Intravenous Once   Continuous Infusions: . heparin 950 Units/hr (03/12/19 0755)    Principal Problem:   Acute CVA (cerebrovascular accident) (HCC) Active Problems:   Atrial fibrillation with rapid ventricular response (HCC)   CAP (community acquired pneumonia)   Hypoglycemia   LOS: 7 days   How to contact the [facility] Attending or Consulting provider 7A - 7P or covering provider during after hours 7P -7A, for this patient?  1. Check the care team in [facility] and look  for a) attending/consulting [place] provider listed and b) the [facility] team listed 2. Log into [email or web address] and use [place]'s universal password to access. If you do not have the password, please contact the hospital operator. 3. Locate the [facility] provider you are looking for under [facility]  and page to a number that you can be directly reached. 4. If you still have difficulty reaching the provider, please page the [facility] (Director on Call) for the Hospitalists listed on amion for assistance.

## 2019-03-12 NOTE — Progress Notes (Signed)
ANTICOAGULATION CONSULT NOTE  Pharmacy Consult for Heparin>> apixaban Indication: atrial fibrillation, acute CVA  Heparin Dosing Weight: 47.6 kg  Labs: Recent Labs    03/10/19 0418 03/11/19 0316 03/12/19 0427  HGB 9.1* 9.8* 10.0*  HCT 28.8* 31.1* 31.4*  PLT 217 238 262  HEPARINUNFRC 0.31 0.34 0.28*  CREATININE 0.84  --   --     Assessment: 59 yof with hx of afib not on anticoagulation PTA presenting with falls, afib with RVR, subacute ischemic stroke. Pharmacy dosing heparin and now starting apixaban -hg= 10, SCr= 0.84, wt= 47kg    Goal of Therapy:  Heparin level 0.3-0.5 units/ml, no boluses Monitor platelets by anticoagulation protocol: Yes   Plan:  -Start apixaban 2.5mg  po bid at 10pm and stop heparin -consider decreasing ASA to 81mg /day or may be able to d/c ASA -Will follow patient progress  Hildred Laser, PharmD Clinical Pharmacist **Pharmacist phone directory can now be found on [email or web address] ([email or web address]).  Listed under [place].

## 2019-03-12 NOTE — Progress Notes (Signed)
ANTICOAGULATION CONSULT NOTE  Pharmacy Consult for Heparin>> apixaban Indication: atrial fibrillation, acute CVA  Heparin Dosing Weight: 47.6 kg  Labs: Recent Labs    03/10/19 0418 03/11/19 0316 03/12/19 0427  HGB 9.1* 9.8* 10.0*  HCT 28.8* 31.1* 31.4*  PLT 217 238 262  HEPARINUNFRC 0.31 0.34 0.28*  CREATININE 0.84  --   --     Assessment: 41 yof with hx of afib not on anticoagulation PTA presenting with falls, afib with RVR, subacute ischemic stroke. Pharmacy dosing heparin and now starting apixaban -hg= 10, SCr= 0.84, wt= 47kg    Goal of Therapy:  Heparin level 0.3-0.5 units/ml, no boluses Monitor platelets by anticoagulation protocol: Yes   Plan:  -Start apixaban 2.5mg  po bid at 10pm and stop heparin -Will follow patient progress  Hildred Laser, PharmD Clinical Pharmacist **Pharmacist phone directory can now be found on [email or web address] ([email or web address]).  Listed under [place].

## 2019-03-12 NOTE — Progress Notes (Signed)
ANTICOAGULATION CONSULT NOTE  Pharmacy Consult for Heparin Indication: atrial fibrillation, acute CVA  Heparin Dosing Weight: 47.6 kg  Labs: Recent Labs    03/10/19 0418 03/11/19 0316 03/12/19 0427  HGB 9.1* 9.8* 10.0*  HCT 28.8* 31.1* 31.4*  PLT 217 238 262  HEPARINUNFRC 0.31 0.34 0.28*  CREATININE 0.84  --   --     Assessment: 60 yof with hx of afib not on anticoagulation PTA presenting with falls, afib with RVR, subacute ischemic stroke.  On heparin gtt  Heparin level slightly low at 0.28  Cbc stable  Goal of Therapy:  Heparin level 0.3-0.5 units/ml, no boluses Monitor platelets by anticoagulation protocol: Yes   Plan:  Increase heparin to 950 units/hr 1600 HL Daily hep lvl cbc F/u plans to convert to oral [facility]  Levester Fresh, PharmD, BCPS, BCCCP Clinical Pharmacist [phone]  Please check AMION for all [place] numbers  03/12/2019 7:39 AM

## 2019-03-12 NOTE — Discharge Instructions (Signed)

## 2019-03-13 LAB — CBC
HCT: 32.2 % — ABNORMAL LOW (ref 36.0–46.0)
Hemoglobin: 10.1 g/dL — ABNORMAL LOW (ref 12.0–15.0)
MCH: 27.8 pg (ref 26.0–34.0)
MCHC: 31.4 g/dL (ref 30.0–36.0)
MCV: 88.7 fL (ref 80.0–100.0)
Platelets: 308 10*3/uL (ref 150–400)
RBC: 3.63 MIL/uL — ABNORMAL LOW (ref 3.87–5.11)
RDW: 27.4 % — ABNORMAL HIGH (ref 11.5–15.5)
WBC: 5.7 10*3/uL (ref 4.0–10.5)
nRBC: 0 % (ref 0.0–0.2)

## 2019-03-13 LAB — GLUCOSE, CAPILLARY
Glucose-Capillary: 86 mg/dL (ref 70–99)
Glucose-Capillary: 94 mg/dL (ref 70–99)
Glucose-Capillary: 91 mg/dL (ref 70–99)
Glucose-Capillary: 93 mg/dL (ref 70–99)
Glucose-Capillary: 123 mg/dL — ABNORMAL HIGH (ref 70–99)

## 2019-03-13 MED ORDER — IRBESARTAN 150 MG PO TABS
150.0000 mg | ORAL_TABLET | Freq: Every day | ORAL | Status: DC
  Administered 2019-03-13 – 2019-03-15 (×3): 150 mg via ORAL
  Filled 2019-03-13 (×3): qty 1

## 2019-03-13 MED ORDER — ASPIRIN 81 MG PO CHEW
81.0000 mg | CHEWABLE_TABLET | Freq: Every day | ORAL | Status: DC
  Administered 2019-03-13 – 2019-03-15 (×3): 81 mg via ORAL
  Filled 2019-03-13 (×3): qty 1

## 2019-03-13 NOTE — Progress Notes (Signed)
PROGRESS NOTE  Sherry [name] ZOX:[id number] DOB: 1929/06/28 DOA: 03/05/2019 PCP: Sherry BussingKoirala, Dibas, MD  Brief History   85 year old woman PMH COPD, essential hypertension, hyperlipidemia, CAD status post stent, atrial fibrillation not on anticoagulation, presented via EMS for evaluation of fall.  Per chart the patient has had a fall at home most every day for the last 2 weeks prior to presentation.  2 weeks prior to presentation had a closed head injury.  Worsening memory for the last several months to years.  Has been using a walker to ambulate after hip replacement surgery 5 years prior.  PMH includes UTIs.  Multiple abnormalities found.  Admitted for atrial fibrillation with rapid ventricular response, possible pneumonia, possible UTI, CT head suspicious for acute/subacute nonhemorrhagic infarct medial left occipital lobe.  A & P  Subacute left PCA ischemic stroke with petechial hemorrhage, cardioembolic.  Initial exam did not reveal any focal motor deficit but did reveal a right homonymous hemianopsia. MRI brain no large vessel stenosis or occlusion 2D echocardiogram with normal LVEF.  --No new issues.  Continue apixaban on discharge.  Plavix was stopped.  New speech therapy needs identified.  Patient is noted to have decreased safety awareness and vision impairment and the risk of fall is high, however after detailed discussion with daughter she elected to continue anticoagulation as recommended. --[facility]  Atrial fibrillation with rapid ventricular response, slow ventricular response.  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  Known atrial fibrillation not on anticoagulation as an outpatient.  On Plavix as an outpatient.  CHA2DS2-VASc 6.  Initially responded to Cardizem infusion and was successfully transitioned to oral therapy. Dr. Rhona [name] discussed with cardiology, given the patient's high risk for stroke, it was felt that anticoagulation benefit outweighed risk and recommendation  was for 2.5 mg apixaban twice daily; Dr. Rhona [name] also discussed with neurology who concurred and recommend starting on discharge --Seen by electrophysiology.  Recommendation was for diltiazem and stop telemetry.  No pacemaker recommended. --Appears stable.  Stop telemetry.  Continue apixaban.  Acute encephalopathy thought secondary to stroke --Resolved. Neurology suspects dementia.  Recurrent falls --Possibly secondary to recent stroke with new visual deficit --Plan for SNF  AKI  --Resolved with IV fluids.    Possible community-acquired pneumonia considered on admission however not hypoxic, afebrile, no leukocytosis. --Treated with ceftriaxone and azithromycin (completed) --Wean oxygen today  Possible UTI considered on admission however culture was unrevealing. --Empirically treated with ceftriaxone, no further evaluation suggested.  Normocytic anemia --Remains stable.  Follow-up as an outpatient if clinically indicated  Hyperlipidemia --Continue statin  Coronary artery disease status post stent --Plavix stopped by neurology as the patient is now on apixaban  Suspected dementia per neurology --Appears stable  Hypoglycemia on admission, resolved.  No lows documented since 7/4 --Monitor.  Moderate to severe tricuspid regurgitation --Asymptomatic, follow-up with cardiology as an outpatient.   Continue current management.  DVT prophylaxis: Apixaban Code Status: Full Family Communication: Called daughter, no answer.  Will call again this afternoon. Disposition Plan: SNF    Sherry Sacksaniel Nechama Escutia, MD  [facility] Direct contact: see [email or web address] (further directions at bottom of note if needed) 7PM-7AM contact night coverage as at bottom of note 03/13/2019, 2:58 PM  LOS: 8 days   Consultants   Neurology  Procedures   Echo IMPRESSIONS    1. The left ventricle has normal systolic function with an ejection fraction of 60-65%. The cavity size was normal. There is  mildly increased left ventricular wall thickness. Left ventricular diastolic function could  not be evaluated. Elevated mean left  atrial pressure.  2. The right ventricle has normal systolic function. The cavity was normal. Right ventricular systolic pressure is mildly elevated.  3. Left atrial size was severely dilated.  4. Right atrial size was severely dilated.  5. The mitral valve is abnormal. Mild thickening of the mitral valve leaflet. There is severe mitral annular calcification present.  6. The tricuspid valve is grossly normal. Tricuspid valve regurgitation is moderate-severe.  7. The aortic valve is tricuspid. Mild thickening of the aortic valve. Aortic valve regurgitation is mild by color flow Doppler. No stenosis of the aortic valve.  8. The inferior vena cava was dilated in size with <50% respiratory variability.  9. Mild apical hypokinesis with overall preserved LV function; mild LVH; severe biatrial enlargement; milld AI; moderate to severe TR; RVSP mildly elevated.  Antibiotics   Azithromycin 7/4 > 7/7  Ceftriaxone 7/4 > 7/7  Interval History/Subjective  Follow-up atrial fibrillation with rapid ventricular response.  Feels poorly.  Objective   Vitals:  Vitals:   03/13/19 0358 03/13/19 0827  BP: (!) 154/80 (!) 144/87  Pulse: (!) 103 71  Resp: 14 16  Temp: 98.2 F (36.8 C) 97.6 F (36.4 C)  SpO2: 93% 94%    Exam:  Constitutional:    Appears calm and comfortable Respiratory:   CTA bilaterally, no w/r/r.   Respiratory effort normal.  Cardiovascular:   RRR, no m/r/g  No LE extremity edema   Telemetry atrial fibrillation Psychiatric:   Mental status o Mood, affect appropriate  I have personally reviewed the following:   Today's Data   Hemoglobin stable, 10.1.  Remainder CBC unremarkable.  SARS-CoV-2 pending  Lab Data   SARS-CoV-2 negative on admission  Micro Data   Blood cultures no growth 4 days  Urine culture unrevealing, multiple  morphotypes, none predominant  Imaging   CT head abnormality noted.  CT cervical spine no acute issues noted  MRI brain showed acute to subacute left PCA territory infarct with petechial hemorrhage, no malignant hemorrhagic transformation or significant mass-effect.  No evidence of large vessel occlusion.  Cardiology: Cardiology Data   EKG showed atrial fibrillation with rapid ventricular response  Other Data   Scheduled Meds:  apixaban  2.5 mg Oral BID   aspirin  81 mg Oral Daily   atorvastatin  40 mg Oral q1800   diltiazem  240 mg Oral Daily   ferrous sulfate  325 mg Oral Q breakfast   sodium chloride flush  3 mL Intravenous Once   Continuous Infusions:   Principal Problem:   Acute CVA (cerebrovascular accident) ([place]) Active Problems:   Atrial fibrillation with rapid ventricular response ([place])   CAP (community acquired pneumonia)   Hypoglycemia   LOS: 8 days   How to contact the [facility] Attending or Consulting provider [place] or covering provider during after hours [place], for this patient?  1. Check the care team in [facility] and look for a) attending/consulting [facility] provider listed and b) the [facility] team listed 2. Log into [email or web address] and use [place]'s universal password to access. If you do not have the password, please contact the hospital operator. 3. Locate the [facility] provider you are looking for under [facility] and page to a number that you can be directly reached. 4. If you still have difficulty reaching the provider, please page the [facility] (Director on Call) for the Hospitalists listed on amion for assistance.

## 2019-03-14 ENCOUNTER — Inpatient Hospital Stay (HOSPITAL_COMMUNITY): Payer: Medicare HMO

## 2019-03-14 DIAGNOSIS — I5031 Acute diastolic (congestive) heart failure: Secondary | ICD-10-CM

## 2019-03-14 LAB — NOVEL CORONAVIRUS, NAA (HOSP ORDER, SEND-OUT TO REF LAB; TAT 18-24 HRS): SARS-CoV-2, NAA: NOT DETECTED

## 2019-03-14 LAB — GLUCOSE, CAPILLARY
Glucose-Capillary: 101 mg/dL — ABNORMAL HIGH (ref 70–99)
Glucose-Capillary: 155 mg/dL — ABNORMAL HIGH (ref 70–99)
Glucose-Capillary: 76 mg/dL (ref 70–99)
Glucose-Capillary: 118 mg/dL — ABNORMAL HIGH (ref 70–99)

## 2019-03-14 MED ORDER — FUROSEMIDE 10 MG/ML IJ SOLN
20.0000 mg | Freq: Once | INTRAMUSCULAR | Status: AC
  Administered 2019-03-14: 20 mg via INTRAVENOUS
  Filled 2019-03-14: qty 4

## 2019-03-14 NOTE — Progress Notes (Signed)
Physical Therapy Treatment Patient Details Name: Sherry [name] MRN: [id number] DOB: December 23, 1928 Today's Date: 03/14/2019    History of Present Illness Sherry [name] is a 85 y.o. female presenting to the hospital via EMS for evaluation of worsening falls. with medical history significant of COPD, hypertension, hyperlipidemia, CAD status post stent, A. fib not on anticoagulation. Negative CT head. C-Collar in room     PT Comments    Patient seen for mobility progression. Pt requires min A for bed mobility and min/mod A for functional transfer training.  Continue to progress as tolerated with anticipated d/c to SNF for further skilled PT services.    Follow Up Recommendations  SNF;Supervision/Assistance - 24 hour     Equipment Recommendations  None recommended by PT    Recommendations for Other Services       Precautions / Restrictions Precautions Precautions: Fall Precaution Comments: has c-collar in room, but negative for C spine fxs Restrictions Weight Bearing Restrictions: No    Mobility  Bed Mobility Overal bed mobility: Needs Assistance Bed Mobility: Supine to Sit     Supine to sit: Min assist     General bed mobility comments: increased time required; use of rail and assist to elevate trunk into sitting  Transfers Overall transfer level: Needs assistance Equipment used: Rolling walker (2 wheeled) Transfers: Sit to/from [employer] CorporationStand;Stand Pivot Transfers Sit to Stand: Mod assist Stand pivot transfers: Min assist       General transfer comment: cues for safe hand placement and assist required to power up into standing and steady and guide RW while pivoting to recliner   Ambulation/Gait             General Gait Details: pt declined ambualting due to fatigue and back pain   Stairs             Wheelchair Mobility    Modified Rankin (Stroke Patients Only) Modified Rankin (Stroke Patients Only) Pre-Morbid Rankin Score: Moderate disability Modified  Rankin: Moderately severe disability     Balance Overall balance assessment: Needs assistance Sitting-balance support: Bilateral upper extremity supported;Feet supported Sitting balance-Leahy Scale: Poor     Standing balance support: Bilateral upper extremity supported;During functional activity Standing balance-Leahy Scale: Poor                              Cognition Arousal/Alertness: Awake/alert Behavior During Therapy: WFL for tasks assessed/performed Overall Cognitive Status: History of cognitive impairments - at baseline                                        Exercises      General Comments        Pertinent Vitals/Pain Pain Assessment: Faces Faces Pain Scale: Hurts little more Pain Location: back, hips, "all over" Pain Descriptors / Indicators: Aching;Sore Pain Intervention(s): Limited activity within patient's tolerance;Monitored during session;Repositioned    Home Living                      Prior Function            PT Goals (current goals can now be found in the care plan section) Acute Rehab PT Goals Patient Stated Goal: to go home Progress towards PT goals: Progressing toward goals    Frequency    Min 3X/week      PT Plan Current  plan remains appropriate    Co-evaluation              AM-PAC PT "6 Clicks" Mobility   Outcome Measure  Help needed turning from your back to your side while in a flat bed without using bedrails?: A Lot Help needed moving from lying on your back to sitting on the side of a flat bed without using bedrails?: A Lot Help needed moving to and from a bed to a chair (including a wheelchair)?: A Lot Help needed standing up from a chair using your arms (e.g., wheelchair or bedside chair)?: A Lot Help needed to walk in hospital room?: A Lot Help needed climbing 3-5 steps with a railing? : Total 6 Click Score: 11    End of Session Equipment Utilized During Treatment: Gait  belt Activity Tolerance: Patient limited by fatigue;Patient limited by pain Patient left: in chair;with call bell/phone within reach;with chair alarm set Nurse Communication: Mobility status PT Visit Diagnosis: Other abnormalities of gait and mobility (R26.89);Difficulty in walking, not elsewhere classified (R26.2)     Time: 4034-7425 PT Time Calculation (min) (ACUTE ONLY): 35 min  Charges:  $Gait Training: 8-22 mins $Therapeutic Activity: 8-22 mins                     Earney Navy, PTA Acute Rehabilitation Services Pager: [phone] Office: [phone]     Darliss Cheney 03/14/2019, 4:31 PM

## 2019-03-14 NOTE — Progress Notes (Signed)
PROGRESS NOTE  Sherry [name] [id number] DOB: December 12, 1928 DOA: 03/05/2019 PCP: Darrow BussingKoirala, Dibas, MD  Brief History   85 year old woman PMH COPD, essential hypertension, hyperlipidemia, CAD status post stent, atrial fibrillation not on anticoagulation, presented via EMS for evaluation of fall.  Per chart the patient has had a fall at home most every day for the last 2 weeks prior to presentation.  2 weeks prior to presentation had a closed head injury.  Worsening memory for the last several months to years.  Has been using a walker to ambulate after hip replacement surgery 5 years prior.  PMH includes UTIs.  Multiple abnormalities found.  Admitted for atrial fibrillation with rapid ventricular response, possible pneumonia, possible UTI, CT head suspicious for acute/subacute nonhemorrhagic infarct medial left occipital lobe.  A & P  Subacute left PCA ischemic stroke with petechial hemorrhage, cardioembolic.  Initial exam did not reveal any focal motor deficit but did reveal a right homonymous hemianopsia. MRI brain no large vessel stenosis or occlusion 2D echocardiogram with normal LVEF.  --No new issues.  Continue apixaban.  Plavix stopped.  At high risk for falls but daughter accepts risk with anticoagulation.  Acute diastolic CHF, seen on chest x-ray, correlates with exam and mild hypoxia.  Probably secondary to rapid heart rate shows +1.8 L. --Relatively asymptomatic.  Will treat with diuresis today and wean oxygen as tolerated. --Can likely go to skilled nursing facility tomorrow  Atrial fibrillation with rapid ventricular response, slow ventricular response.  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  Known atrial fibrillation not on anticoagulation as an outpatient.  On Plavix as an outpatient.  CHA2DS2-VASc 6.  Initially responded to Cardizem infusion and was successfully transitioned to oral therapy. Dr. Rhona Leavenshiu discussed with cardiology, given the patient's high risk for stroke, it was felt that  anticoagulation benefit outweighed risk and recommendation was for 2.5 mg apixaban twice daily; Dr. Rhona Leavenshiu also discussed with neurology who concurred and recommend starting on discharge --Seen by electrophysiology.  Recommendation was for diltiazem and stop telemetry.  No pacemaker recommended. --Remained stable.  Continue apixaban.  Acute encephalopathy thought secondary to stroke --Resolved. Neurology suspects dementia.  Recurrent falls --Possibly secondary to recent stroke with new visual deficit --Plan for SNF  AKI  --Resolved with IV fluids.    Possible community-acquired pneumonia considered on admission however not hypoxic, afebrile, no leukocytosis. --Treated with ceftriaxone and azithromycin (completed)  Possible UTI considered on admission however culture was unrevealing. --Empirically treated with ceftriaxone, no further evaluation suggested.  Normocytic anemia --Stable.  Follow-up as an outpatient if clinically indicated  Hyperlipidemia --Continue statin  Coronary artery disease status post stent --Plavix stopped by neurology as the patient is now on apixaban  Suspected dementia per neurology --Appears stable  Hypoglycemia on admission, resolved.  No lows documented since 7/4 --Monitor.  Moderate to severe tricuspid regurgitation --Asymptomatic, follow-up with cardiology as an outpatient.   Treat mild CHF with aggressive diuresis.  BMP in a.m.  Anticipate transfer to skilled nursing facility tomorrow if authorization available  DVT prophylaxis: Apixaban Code Status: Full Family Communication: I spoke with daughter yesterday evening and I will update again today. Disposition Plan: SNF    Brendia Sacksaniel Chyanne Kohut, MD  [facility] Direct contact: see [email or web address] (further directions at bottom of note if needed) 7PM-7AM contact night coverage as at bottom of note 03/14/2019, 2:49 PM  LOS: 9 days   Consultants   Neurology  Procedures    Echo IMPRESSIONS    1. The left ventricle has normal systolic function  with an ejection fraction of 60-65%. The cavity size was normal. There is mildly increased left ventricular wall thickness. Left ventricular diastolic function could not be evaluated. Elevated mean left  atrial pressure.  2. The right ventricle has normal systolic function. The cavity was normal. Right ventricular systolic pressure is mildly elevated.  3. Left atrial size was severely dilated.  4. Right atrial size was severely dilated.  5. The mitral valve is abnormal. Mild thickening of the mitral valve leaflet. There is severe mitral annular calcification present.  6. The tricuspid valve is grossly normal. Tricuspid valve regurgitation is moderate-severe.  7. The aortic valve is tricuspid. Mild thickening of the aortic valve. Aortic valve regurgitation is mild by color flow Doppler. No stenosis of the aortic valve.  8. The inferior vena cava was dilated in size with <50% respiratory variability.  9. Mild apical hypokinesis with overall preserved LV function; mild LVH; severe biatrial enlargement; milld AI; moderate to severe TR; RVSP mildly elevated.  Antibiotics   Azithromycin 7/4 > 7/7  Ceftriaxone 7/4 > 7/7  Interval History/Subjective  Overall feels okay.  Objective   Vitals:  Vitals:   03/14/19 0900 03/14/19 1100  BP: (!) 149/86 (!) 133/118  Pulse: 100 (!) 107  Resp: 15 16  Temp: (!) 97.5 F (36.4 C) 98.4 F (36.9 C)  SpO2: 96% 98%    Exam:  Constitutional:    Appears calm and comfortable Respiratory:   Inspiratory crackles on the right.  Respiratory effort grossly unremarkable Cardiovascular:   RRR, no m/r/g  No LE extremity edema   Psychiatric:   Mental status o Mood, affect appropriate  I have personally reviewed the following:   Today's Data   Urine output 1700.  +1.8 L since admission.  Lab Data   SARS-CoV-2 negative on admission  Micro Data   Blood cultures  no growth 4 days  Urine culture unrevealing, multiple morphotypes, none predominant  Imaging   CT head abnormality noted.  CT cervical spine no acute issues noted  MRI brain showed acute to subacute left PCA territory infarct with petechial hemorrhage, no malignant hemorrhagic transformation or significant mass-effect.  No evidence of large vessel occlusion.  Cardiology: Cardiology Data   EKG showed atrial fibrillation with rapid ventricular response  Other Data   Scheduled Meds:  apixaban  2.5 mg Oral BID   aspirin  81 mg Oral Daily   atorvastatin  40 mg Oral q1800   diltiazem  240 mg Oral Daily   ferrous sulfate  325 mg Oral Q breakfast   irbesartan  150 mg Oral Daily   sodium chloride flush  3 mL Intravenous Once   Continuous Infusions:   Principal Problem:   Acute CVA (cerebrovascular accident) (HCC) Active Problems:   Atrial fibrillation with rapid ventricular response (HCC)   CAP (community acquired pneumonia)   Hypoglycemia   LOS: 9 days   How to contact the [facility] Attending or Consulting provider 7A - 7P or covering provider during after hours 7P -7A, for this patient?  1. Check the care team in [facility] and look for a) attending/consulting [facility] provider listed and b) the [facility] team listed 2. Log into [email or web address] and use [facility] universal password to access. If you do not have the password, please contact the hospital operator. 3. Locate the [facility] provider you are looking for under [facility] and page to a number that you can be directly reached. 4. If you still have difficulty reaching the provider, please page the  DOC (Director on Call) for the Hospitalists listed on amion for assistance.

## 2019-03-15 ENCOUNTER — Encounter (HOSPITAL_COMMUNITY): Payer: Self-pay | Admitting: Family Medicine

## 2019-03-15 DIAGNOSIS — R1312 Dysphagia, oropharyngeal phase: Secondary | ICD-10-CM | POA: Diagnosis not present

## 2019-03-15 DIAGNOSIS — R278 Other lack of coordination: Secondary | ICD-10-CM | POA: Diagnosis not present

## 2019-03-15 DIAGNOSIS — I480 Paroxysmal atrial fibrillation: Secondary | ICD-10-CM | POA: Diagnosis not present

## 2019-03-15 DIAGNOSIS — I639 Cerebral infarction, unspecified: Secondary | ICD-10-CM | POA: Diagnosis not present

## 2019-03-15 DIAGNOSIS — I5031 Acute diastolic (congestive) heart failure: Secondary | ICD-10-CM | POA: Diagnosis not present

## 2019-03-15 DIAGNOSIS — R41841 Cognitive communication deficit: Secondary | ICD-10-CM | POA: Diagnosis not present

## 2019-03-15 DIAGNOSIS — D6489 Other specified anemias: Secondary | ICD-10-CM | POA: Diagnosis not present

## 2019-03-15 DIAGNOSIS — M6281 Muscle weakness (generalized): Secondary | ICD-10-CM | POA: Diagnosis not present

## 2019-03-15 DIAGNOSIS — Z743 Need for continuous supervision: Secondary | ICD-10-CM | POA: Diagnosis not present

## 2019-03-15 DIAGNOSIS — I10 Essential (primary) hypertension: Secondary | ICD-10-CM | POA: Diagnosis not present

## 2019-03-15 DIAGNOSIS — I5032 Chronic diastolic (congestive) heart failure: Secondary | ICD-10-CM | POA: Diagnosis not present

## 2019-03-15 DIAGNOSIS — R296 Repeated falls: Secondary | ICD-10-CM | POA: Diagnosis not present

## 2019-03-15 DIAGNOSIS — I6389 Other cerebral infarction: Secondary | ICD-10-CM | POA: Diagnosis not present

## 2019-03-15 DIAGNOSIS — R0902 Hypoxemia: Secondary | ICD-10-CM | POA: Diagnosis not present

## 2019-03-15 DIAGNOSIS — R2689 Other abnormalities of gait and mobility: Secondary | ICD-10-CM | POA: Diagnosis not present

## 2019-03-15 DIAGNOSIS — I4891 Unspecified atrial fibrillation: Secondary | ICD-10-CM | POA: Diagnosis not present

## 2019-03-15 DIAGNOSIS — J188 Other pneumonia, unspecified organism: Secondary | ICD-10-CM | POA: Diagnosis not present

## 2019-03-15 DIAGNOSIS — E161 Other hypoglycemia: Secondary | ICD-10-CM | POA: Diagnosis not present

## 2019-03-15 DIAGNOSIS — J189 Pneumonia, unspecified organism: Secondary | ICD-10-CM | POA: Diagnosis not present

## 2019-03-15 DIAGNOSIS — R2681 Unsteadiness on feet: Secondary | ICD-10-CM | POA: Diagnosis not present

## 2019-03-15 DIAGNOSIS — I7389 Other specified peripheral vascular diseases: Secondary | ICD-10-CM | POA: Diagnosis not present

## 2019-03-15 DIAGNOSIS — J984 Other disorders of lung: Secondary | ICD-10-CM | POA: Diagnosis not present

## 2019-03-15 DIAGNOSIS — F0789 Other personality and behavioral disorders due to known physiological condition: Secondary | ICD-10-CM | POA: Diagnosis not present

## 2019-03-15 DIAGNOSIS — R5381 Other malaise: Secondary | ICD-10-CM | POA: Diagnosis not present

## 2019-03-15 DIAGNOSIS — R279 Unspecified lack of coordination: Secondary | ICD-10-CM | POA: Diagnosis not present

## 2019-03-15 DIAGNOSIS — Z9181 History of falling: Secondary | ICD-10-CM | POA: Diagnosis not present

## 2019-03-15 DIAGNOSIS — R001 Bradycardia, unspecified: Secondary | ICD-10-CM | POA: Diagnosis not present

## 2019-03-15 LAB — GLUCOSE, CAPILLARY
Glucose-Capillary: 88 mg/dL (ref 70–99)
Glucose-Capillary: 106 mg/dL — ABNORMAL HIGH (ref 70–99)
Glucose-Capillary: 140 mg/dL — ABNORMAL HIGH (ref 70–99)

## 2019-03-15 LAB — BASIC METABOLIC PANEL
Anion gap: 9 (ref 5–15)
BUN: 14 mg/dL (ref 8–23)
CO2: 27 mmol/L (ref 22–32)
Calcium: 9 mg/dL (ref 8.9–10.3)
Chloride: 103 mmol/L (ref 98–111)
Creatinine, Ser: 0.93 mg/dL (ref 0.44–1.00)
GFR calc Af Amer: >60 mL/min (ref >60)
GFR calc non Af Amer: 55 mL/min — ABNORMAL LOW (ref >60)
Glucose, Bld: 81 mg/dL (ref 70–99)
Potassium: 3.8 mmol/L (ref 3.5–5.1)
Sodium: 139 mmol/L (ref 135–145)

## 2019-03-15 MED ORDER — FUROSEMIDE 20 MG PO TABS
20.0000 mg | ORAL_TABLET | Freq: Once | ORAL | Status: AC
  Administered 2019-03-15: 20 mg via ORAL
  Filled 2019-03-15: qty 1

## 2019-03-15 MED ORDER — FERROUS SULFATE 325 (65 FE) MG PO TABS: 325.0000 mg | ORAL_TABLET | Freq: Every day | ORAL | 3 refills | Status: DC

## 2019-03-15 MED ORDER — APIXABAN 2.5 MG PO TABS: 2.5000 mg | ORAL_TABLET | Freq: Two times a day (BID) | ORAL | Status: DC

## 2019-03-15 MED ORDER — DILTIAZEM HCL ER COATED BEADS 240 MG PO CP24: 240.0000 mg | ORAL_CAPSULE | Freq: Every day | ORAL | 0 refills | Status: DC

## 2019-03-15 NOTE — Progress Notes (Signed)
Physical Therapy Treatment Patient Details Name: Sherry [name] MRN: [id number] DOB: Jan 21, 1929 Today's Date: 03/15/2019    History of Present Illness 85 y.o. female presenting to the hospital via EMS for evaluation of worsening falls. with medical history significant of COPD, hypertension, hyperlipidemia, CAD status post stent, A. fib not on anticoagulation. Negative CT head. C-Collar in room     PT Comments    Pt was assisted to do bed ex's today, and was feeling too tired per her report to attempt a transfer to the chair.  Pt is very animated in discussion but is reporting this inability to assist transfer.  Will continue on with acute therapy to progress with standing, balance and endurance to work toward gait skills as prior to her neurological event.  Pt is motivated to try, and will anticipate a decision regarding her c collar needs to be made regarding need as this will make all mobility much more challenging for her.  Follow acutely for all movement and goals of therapy.    Follow Up Recommendations  SNF     Equipment Recommendations  None recommended by PT    Recommendations for Other Services       Precautions / Restrictions Precautions Precautions: Fall Precaution Comments: has c-collar in room, but negative for C spine fxs Restrictions Weight Bearing Restrictions: No    Mobility  Bed Mobility Overal bed mobility: Needs Assistance             General bed mobility comments: declined to get out of bed but demonstrated assisted scooting up the bed  Transfers                 General transfer comment: declined  Ambulation/Gait             General Gait Details: did not agree to try   Stairs             Wheelchair Mobility    Modified Rankin (Stroke Patients Only) Modified Rankin (Stroke Patients Only) Pre-Morbid Rankin Score: Moderate disability Modified Rankin: Moderately severe disability     Balance                                            Cognition Arousal/Alertness: Awake/alert Behavior During Therapy: WFL for tasks assessed/performed Overall Cognitive Status: History of cognitive impairments - at baseline                                 General Comments: pt can be assisted with some occas recues for LE ex's      Exercises General Exercises - Lower Extremity Ankle Circles/Pumps: AROM;AAROM;Both;10 reps Quad Sets: AROM;Both;10 reps Gluteal Sets: AROM;Both;10 reps Heel Slides: AAROM;Both;10 reps Hip ABduction/ADduction: AROM;AAROM;Both;10 reps Straight Leg Raises: AROM;AAROM;Both;10 reps    General Comments General comments (skin integrity, edema, etc.): Pt was able to work with PT on ther ex but did not want to stand, but is very energetic and animated to discuss therapy      Pertinent Vitals/Pain Pain Assessment: Faces Faces Pain Scale: Hurts little more Pain Location: L hip  Pain Descriptors / Indicators: Aching Pain Intervention(s): Limited activity within patient's tolerance;Monitored during session;Premedicated before session;Repositioned    Home Living  Prior Function            PT Goals (current goals can now be found in the care plan section) Progress towards PT goals: Progressing toward goals    Frequency    Min 3X/week      PT Plan Current plan remains appropriate    Co-evaluation              AM-PAC PT "6 Clicks" Mobility   Outcome Measure  Help needed turning from your back to your side while in a flat bed without using bedrails?: A Little Help needed moving from lying on your back to sitting on the side of a flat bed without using bedrails?: A Lot Help needed moving to and from a bed to a chair (including a wheelchair)?: A Lot Help needed standing up from a chair using your arms (e.g., wheelchair or bedside chair)?: A Lot Help needed to walk in hospital room?: A Lot Help needed climbing 3-5 steps  with a railing? : Total 6 Click Score: 12    End of Session   Activity Tolerance: Patient tolerated treatment well;Patient limited by fatigue Patient left: in bed;with call bell/phone within reach;with bed alarm set Nurse Communication: Mobility status PT Visit Diagnosis: Unsteadiness on feet (R26.81);Muscle weakness (generalized) (M62.81);Difficulty in walking, not elsewhere classified (R26.2)     Time: 7989-2119 PT Time Calculation (min) (ACUTE ONLY): 24 min  Charges:  $Therapeutic Exercise: 23-37 mins                   Ramond Dial 03/15/2019, 12:52 PM   Mee Hives, PT MS Acute Rehab Dept. Number: [place]

## 2019-03-15 NOTE — Discharge Summary (Addendum)
Physician Discharge Summary  Sherry [name]:[id number] DOB: 02-17-1929 DOA: 03/05/2019  PCP: Darrow Bussing, MD  Admit date: 03/05/2019 Discharge date: 03/15/2019  Recommendations for Outpatient Follow-up:  Subacute left PCA ischemic stroke with petechial hemorrhage, cardioembolic.  Initial exam did not reveal any focal motor deficit but did reveal a right homonymous hemianopsia. MRI brain no large vessel stenosis or occlusion 2D echocardiogram with normal LVEF.  --No new issues.  Continue apixaban.  Plavix stopped.  Aspirin stopped. At high risk for falls but daughter accepts risk with anticoagulation.  Acute diastolic CHF, seen on chest x-ray, correlates with exam and mild hypoxia.  Probably secondary to rapid heart rate shows +1.8 L. --Clinically resolved.  Wean oxygen as tolerated.  Lasix as needed.  Atrial fibrillation with rapid ventricular response, slow ventricular response.  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   --Continue apixaban, diltiazem  Moderate to severe tricuspid regurgitation --Asymptomatic, follow-up with cardiology as an outpatient.   Contact information for follow-up providers    Koirala, Dibas, MD. Schedule an appointment as soon as possible for a visit in 1 week(s).   Specialty: Family Medicine Contact information: [address] [place] [address] [phone]            Contact information for after-discharge care    Destination    [facility] .   Service: Skilled Nursing Contact information: [address] [place] [address] [phone]                   Discharge Diagnoses: Principal diagnosis is #1 1. Subacute left PCA ischemic stroke with petechial hemorrhage, cardioembolic 2. Acute diastolic CHF 3. Atrial fibrillation with rapid ventricular response, slow ventricular response 4. Acute encephalopathy thought secondary to stroke 5. Recurrent falls 6. AKI  7. Normocytic  anemia 8. Hyperlipidemia 9. Suspected dementia per neurology 10. Hypoglycemia on admission 11. Moderate to severe tricuspid regurgitation    Discharge Condition: Improved Disposition: SNF  Diet recommendation: heart healthy  Filed Weights   03/05/19 2000  Weight: 47.6 kg    History of present illness:  85 year old woman PMH COPD, essential hypertension, hyperlipidemia, CAD status post stent, atrial fibrillation not on anticoagulation, presented via EMS for evaluation of fall.  Per chart the patient has had a fall at home most every day for the last 2 weeks prior to presentation.  2 weeks prior to presentation had a closed head injury.  Worsening memory for the last several months to years. Has been using a walker to ambulate after hip replacement surgery 5 years prior.  PMH includes UTIs.  Multiple abnormalities found.  Admitted for atrial fibrillation with rapid ventricular response, possible pneumonia, possible UTI, CT head suspicious for acute/subacute nonhemorrhagic infarct medial left occipital lobe.  Hospital Course:  Further evaluation revealed subacute left ischemic stroke, patient was seen by neurology, completed her evaluation, stroke team recommended apixaban on discharge, Lipitor and and long-term blood pressure control.  Acute encephalopathy spontaneously resolved.  Pneumonia was considered on admission but patient had no definite findings.  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  Patient was seen by electrophysiology and felt to be asymptomatic, recommendation was to increase Cardizem dose and stop telemetry.  No indication for pacemaker.  Patient had mild acute diastolic CHF secondary to rapid heart rate, this is improved nicely with diuresis and appears clinically resolved at this point.  Subacute left PCA ischemic stroke with petechial hemorrhage, cardioembolic.  Initial exam did not reveal any focal motor deficit  but did reveal a right homonymous  hemianopsia. MRI brain no large vessel stenosis or occlusion 2D echocardiogram with normal LVEF.  --No new issues.  Continue apixaban.  Plavix stopped.  Aspirin stopped. At high risk for falls but daughter accepts risk with anticoagulation.  Acute diastolic CHF, seen on chest x-ray, correlates with exam and mild hypoxia.  Probably secondary to rapid heart rate shows +1.8 L. --Clinically resolved.  Wean oxygen as tolerated.  Lasix as needed.  Atrial fibrillation with rapid ventricular response, slow ventricular response.  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  Known atrial fibrillation not on anticoagulation as an outpatient.  On Plavix as an outpatient.  CHA2DS2-VASc 6.  Initially responded to Cardizem infusion and was successfully transitioned to oral therapy. Dr. Rhona Leavens discussed with cardiology, given the patient's high risk for stroke, it was felt that anticoagulation benefit outweighed risk and recommendation was for 2.5 mg apixaban twice daily; Dr. Rhona Leavens also discussed with neurology who concurred and recommend starting on discharge --Seen by electrophysiology.  Recommendation was for diltiazem and stop telemetry.  No pacemaker recommended. --Remained stable.  Continue apixaban.  Acute encephalopathy thought secondary to stroke --Resolved. Neurology suspects dementia.  Recurrent falls --Possibly secondary to recent stroke with new visual deficit --Plan for SNF  [facility]I  --Resolved with IV fluids.    Possible community-acquired pneumonia considered on admission however not hypoxic, afebrile, no leukocytosis. --Treated with ceftriaxone and azithromycin (completed)  Possible UTI considered on admission however culture was unrevealing. --Empirically treated with ceftriaxone, no further evaluation suggested.  Normocytic anemia --Stable.  Follow-up as an outpatient if clinically indicated  Hyperlipidemia --Continue statin  Coronary artery disease?  --Plavix stopped by neurology  as the patient is now on apixaban  Suspected dementia per neurology --Appears stable  Hypoglycemia on admission, resolved.  No lows documented since 7/4 --Monitor.  Moderate to severe tricuspid regurgitation --Asymptomatic, follow-up with cardiology as an outpatient.  Consultants   Neurology  Procedures   Echo IMPRESSIONS   1. The left ventricle has normal systolic function with an ejection fraction of 60-65%. The cavity size was normal. There is mildly increased left ventricular wall thickness. Left ventricular diastolic function could not be evaluated. Elevated mean left atrial pressure. 2. The right ventricle has normal systolic function. The cavity was normal. Right ventricular systolic pressure is mildly elevated. 3. Left atrial size was severely dilated. 4. Right atrial size was severely dilated. 5. The mitral valve is abnormal. Mild thickening of the mitral valve leaflet. There is severe mitral annular calcification present. 6. The tricuspid valve is grossly normal. Tricuspid valve regurgitation is moderate-severe. 7. The aortic valve is tricuspid. Mild thickening of the aortic valve. Aortic valve regurgitation is mild by color flow Doppler. No stenosis of the aortic valve. 8. The inferior vena cava was dilated in size with <50% respiratory variability. 9. Mild apical hypokinesis with overall preserved LV function; mild LVH; severe biatrial enlargement; milld AI; moderate to severe TR; RVSP mildly elevated.  Antibiotics   Azithromycin 7/4 > 7/7  Ceftriaxone 7/4 > 7/7  Today's assessment: S: Feels well, no complaints.  Breathing fine.  Tolerating diet. O: Vitals:  Vitals:   03/15/19 0700 03/15/19 1100  BP: (!) 119/52 (!) 130/58  Pulse: (!) 47 (!) 52  Resp: 18 18  Temp: 97.8 F (36.6 C) 97.6 F (36.4 C)  SpO2: 97% 96%    Constitutional:   Appears calm and comfortable Respiratory:   CTA bilaterally, no w/r/r.   Respiratory effort  normal. Cardiovascular:  RRR, no m/r/g  No LE extremity edema   Psychiatric:   Mental status o Mood, affect appropriate   Urine output 1700 -1 L overnight BMP unremarkable CBG unremarkable  Discharge Instructions   Allergies as of 03/15/2019   No Known Allergies     Medication List    STOP taking these medications   aspirin 81 MG chewable tablet   cephALEXin 500 MG capsule Commonly known as: Keflex   clopidogrel 75 MG tablet Commonly known as: PLAVIX   traMADol 50 MG tablet Commonly known as: ULTRAM     TAKE these medications   acetaminophen 500 MG tablet Commonly known as: TYLENOL Take 2 tablets (1,000 mg total) by mouth every 8 (eight) hours as needed for mild pain, fever or headache. What changed:   when to take this  reasons to take this   apixaban 2.5 MG Tabs tablet Commonly known as: ELIQUIS Take 1 tablet (2.5 mg total) by mouth 2 (two) times daily.   atorvastatin 40 MG tablet Commonly known as: LIPITOR Take 40 mg by mouth daily.   diltiazem 240 MG 24 hr capsule Commonly known as: CARDIZEM CD Take 1 capsule (240 mg total) by mouth daily. Start taking on: March 16, 2019 What changed:   medication strength  how much to take   ferrous sulfate 325 (65 FE) MG tablet Take 1 tablet (325 mg total) by mouth daily with breakfast. Start taking on: March 16, 2019   hydrOXYzine 50 MG tablet Commonly known as: ATARAX/VISTARIL Take 50 mg by mouth daily.   ipratropium-albuterol 0.5-2.5 (3) MG/3ML Soln Commonly known as: DUONEB Take 3 mLs by nebulization every 4 (four) hours as needed (sob/wheezing).   telmisartan 40 MG tablet Commonly known as: MICARDIS Take 40 mg by mouth daily.      No Known Allergies  The results of significant diagnostics from this hospitalization (including imaging, microbiology, ancillary and laboratory) are listed below for reference.    Significant Diagnostic Studies: Ct Head Wo Contrast  Result Date:  03/05/2019 CLINICAL DATA:  Frequent falls.  Altered mental status. EXAM: CT HEAD WITHOUT CONTRAST CT CERVICAL SPINE WITHOUT CONTRAST TECHNIQUE: Multidetector CT imaging of the head and cervical spine was performed following the standard protocol without intravenous contrast. Multiplanar CT image reconstructions of the cervical spine were also generated. COMPARISON:  None. FINDINGS: CT HEAD FINDINGS Brain: No evidence of parenchymal hemorrhage or extra-axial fluid collection. No mass lesion, mass effect, or midline shift. Loss of gray-white differentiation in medial left occipital lobe with associated mild sulcal effacement. Generalized cerebral volume loss. Nonspecific prominent subcortical and periventricular white matter hypodensity, most in keeping with chronic small vessel ischemic change. Cerebral ventricle sizes are concordant with the degree of cerebral volume loss. Vascular: No acute abnormality. Skull: No evidence of calvarial fracture. Sinuses/Orbits: The visualized paranasal sinuses are essentially clear. Other:  The mastoid air cells are unopacified. CT CERVICAL SPINE FINDINGS Alignment: Mild straightening of the cervical spine. No facet subluxation. Dens is well positioned between the lateral masses of C1. Minimal 2 mm anterolisthesis at C3-4, C4-5 and C7-T1. Skull base and vertebrae: No acute fracture. No primary bone lesion or focal pathologic process. Soft tissues and spinal canal: No prevertebral edema. No visible canal hematoma. Disc levels: Marked multilevel cervical degenerative disc disease, most prominent at C5-6. Advanced bilateral facet arthropathy. Moderate bilateral degenerative foraminal stenosis at C3-4. Severe degenerative foraminal stenosis on the right at C4-5. Mild to moderate degenerative foraminal stenosis on the left at C5-6 and C6-7. Upper  chest: No acute abnormality. Other: Visualized mastoid air cells appear clear. No discrete thyroid nodules. No pathologically enlarged cervical  nodes. IMPRESSION: 1. Loss of gray-white differentiation in the medial left occipital lobe with associated mild sulcal effacement, suspicious for acute/subacute nonhemorrhagic infarct. 2. No acute intracranial hemorrhage. 3. Generalized cerebral volume loss and prominent chronic small vessel ischemic changes in the cerebral white matter. 4. No cervical spine fracture or facet subluxation. 5. Advanced degenerative changes throughout the cervical spine as detailed. Critical Value/emergent results were called by telephone at the time of interpretation on 03/05/2019 at 6:52 pm to Dr. Alvira MondayERIN SCHLOSSMAN , who verbally acknowledged these results. Electronically Signed   By: Delbert PhenixJason A Poff M.D.   On: 03/05/2019 18:55   Ct Cervical Spine Wo Contrast  Result Date: 03/05/2019 CLINICAL DATA:  Frequent falls.  Altered mental status. EXAM: CT HEAD WITHOUT CONTRAST CT CERVICAL SPINE WITHOUT CONTRAST TECHNIQUE: Multidetector CT imaging of the head and cervical spine was performed following the standard protocol without intravenous contrast. Multiplanar CT image reconstructions of the cervical spine were also generated. COMPARISON:  None. FINDINGS: CT HEAD FINDINGS Brain: No evidence of parenchymal hemorrhage or extra-axial fluid collection. No mass lesion, mass effect, or midline shift. Loss of gray-white differentiation in medial left occipital lobe with associated mild sulcal effacement. Generalized cerebral volume loss. Nonspecific prominent subcortical and periventricular white matter hypodensity, most in keeping with chronic small vessel ischemic change. Cerebral ventricle sizes are concordant with the degree of cerebral volume loss. Vascular: No acute abnormality. Skull: No evidence of calvarial fracture. Sinuses/Orbits: The visualized paranasal sinuses are essentially clear. Other:  The mastoid air cells are unopacified. CT CERVICAL SPINE FINDINGS Alignment: Mild straightening of the cervical spine. No facet subluxation. Dens  is well positioned between the lateral masses of C1. Minimal 2 mm anterolisthesis at C3-4, C4-5 and C7-T1. Skull base and vertebrae: No acute fracture. No primary bone lesion or focal pathologic process. Soft tissues and spinal canal: No prevertebral edema. No visible canal hematoma. Disc levels: Marked multilevel cervical degenerative disc disease, most prominent at C5-6. Advanced bilateral facet arthropathy. Moderate bilateral degenerative foraminal stenosis at C3-4. Severe degenerative foraminal stenosis on the right at C4-5. Mild to moderate degenerative foraminal stenosis on the left at C5-6 and C6-7. Upper chest: No acute abnormality. Other: Visualized mastoid air cells appear clear. No discrete thyroid nodules. No pathologically enlarged cervical nodes. IMPRESSION: 1. Loss of gray-white differentiation in the medial left occipital lobe with associated mild sulcal effacement, suspicious for acute/subacute nonhemorrhagic infarct. 2. No acute intracranial hemorrhage. 3. Generalized cerebral volume loss and prominent chronic small vessel ischemic changes in the cerebral white matter. 4. No cervical spine fracture or facet subluxation. 5. Advanced degenerative changes throughout the cervical spine as detailed. Critical Value/emergent results were called by telephone at the time of interpretation on 03/05/2019 at 6:52 pm to Dr. Alvira MondayERIN SCHLOSSMAN , who verbally acknowledged these results. Electronically Signed   By: Delbert PhenixJason A Poff M.D.   On: 03/05/2019 18:55   Mr Angio Head Wo Contrast  Addendum Date: 03/06/2019   ADDENDUM REPORT: 03/06/2019 16:12 ADDENDUM: Study discussed by telephone with Dr. Rhona Leavenshiu on 03/06/2019 at 1550 hours. Electronically Signed   By: Odessa FlemingH  Hall M.D.   On: 03/06/2019 16:12   Result Date: 03/06/2019 CLINICAL DATA:  85 year old female with confusion and evidence of acute to subacute left PCA infarct on head CT yesterday. EXAM: MRI HEAD WITHOUT CONTRAST MRA HEAD WITHOUT CONTRAST MRA NECK WITHOUT CONTRAST  TECHNIQUE: Multiplanar, multiecho pulse sequences  of the brain and surrounding structures were obtained without intravenous contrast. Angiographic images of the Circle of Willis were obtained using MRA technique without intravenous contrast. Angiographic images of the neck were obtained using MRA technique without intravenous contrast. Carotid stenosis measurements (when applicable) are obtained utilizing NASCET criteria, using the distal internal carotid diameter as the denominator. COMPARISON:  Head CT without contrast 03/05/2019. FINDINGS: Study is intermittently degraded by motion artifact despite repeated imaging attempts. MRI HEAD FINDINGS Brain: Confluent restricted diffusion in the left PCA territory corresponding to the hypodense area on CT yesterday. Associated hemorrhage on SWI (series 8, image 31) but no significant mass effect. A portion of the dorsal left thalamus is affected. Cytotoxic edema on T2 and FLAIR. No other restricted diffusion. No midline shift, mass effect, evidence of mass lesion, ventriculomegaly, extra-axial collection or acute intracranial hemorrhage. Cervicomedullary junction and pituitary are within normal limits. Confluent bilateral cerebral white matter T2 and FLAIR hyperintensity. Brainstem and cerebellum appear to remain normal for age. No chronic cerebral blood products identified. Vascular: Major intracranial vascular flow voids are preserved, see below. Skull and upper cervical spine: Cervical spine degraded by motion, grossly negative. Visible bone marrow signal appears normal. Sinuses/Orbits: Paranasal sinuses are clear. Orbits appear stable and negative. Other: Mastoid air cells are clear. Scalp and face soft tissues appear negative. MRA NECK FINDINGS Motion degraded time-of-flight images. There is antegrade flow in the bilateral cervical carotid and vertebral arteries to the skull base. Detail of both carotid bifurcations is degraded, but there is no convincing high-grade  carotid stenosis in the neck. Likewise vertebral detail is degraded. The left vertebral artery appears dominant. MRA HEAD FINDINGS Antegrade flow in the posterior circulation. Dominant left vertebral artery. No V4 segment stenosis. Both PICA appear patent. Patent vertebrobasilar junction and basilar artery without stenosis. Patent SCA and PCA origins. There is no proximal left PCA occlusion, the P1 and P2 segments are patent. Distal PCA branch detail is limited. Antegrade flow in both ICA siphons with patent carotid termini. Both siphons appear irregular, but no hemodynamically significant stenosis is evident. The MCA and ACA origins are normal. The anterior communicating artery and visible ACA branches are within normal limits. Both MCA M1 segments and bifurcations are patent. No MCA branch occlusion is identified. IMPRESSION: 1. Acute to subacute confluent Left PCA territory infarct with petechial hemorrhage, but no malignant hemorrhagic transformation or significant mass effect. 2. No other acute intracranial abnormality. 3. Motion degraded head and neck MRA are negative for large vessel occlusion. And no intracranial hemodynamically significant stenosis is identified. Electronically Signed: By: Odessa Fleming M.D. On: 03/06/2019 15:31   Mr Angio Neck Wo Contrast  Addendum Date: 03/06/2019   ADDENDUM REPORT: 03/06/2019 16:12 ADDENDUM: Study discussed by telephone with Dr. Rhona Leavens on 03/06/2019 at 1550 hours. Electronically Signed   By: Odessa Fleming M.D.   On: 03/06/2019 16:12   Result Date: 03/06/2019 CLINICAL DATA:  85 year old female with confusion and evidence of acute to subacute left PCA infarct on head CT yesterday. EXAM: MRI HEAD WITHOUT CONTRAST MRA HEAD WITHOUT CONTRAST MRA NECK WITHOUT CONTRAST TECHNIQUE: Multiplanar, multiecho pulse sequences of the brain and surrounding structures were obtained without intravenous contrast. Angiographic images of the Circle of Willis were obtained using MRA technique without  intravenous contrast. Angiographic images of the neck were obtained using MRA technique without intravenous contrast. Carotid stenosis measurements (when applicable) are obtained utilizing NASCET criteria, using the distal internal carotid diameter as the denominator. COMPARISON:  Head CT without contrast  03/05/2019. FINDINGS: Study is intermittently degraded by motion artifact despite repeated imaging attempts. MRI HEAD FINDINGS Brain: Confluent restricted diffusion in the left PCA territory corresponding to the hypodense area on CT yesterday. Associated hemorrhage on SWI (series 8, image 31) but no significant mass effect. A portion of the dorsal left thalamus is affected. Cytotoxic edema on T2 and FLAIR. No other restricted diffusion. No midline shift, mass effect, evidence of mass lesion, ventriculomegaly, extra-axial collection or acute intracranial hemorrhage. Cervicomedullary junction and pituitary are within normal limits. Confluent bilateral cerebral white matter T2 and FLAIR hyperintensity. Brainstem and cerebellum appear to remain normal for age. No chronic cerebral blood products identified. Vascular: Major intracranial vascular flow voids are preserved, see below. Skull and upper cervical spine: Cervical spine degraded by motion, grossly negative. Visible bone marrow signal appears normal. Sinuses/Orbits: Paranasal sinuses are clear. Orbits appear stable and negative. Other: Mastoid air cells are clear. Scalp and face soft tissues appear negative. MRA NECK FINDINGS Motion degraded time-of-flight images. There is antegrade flow in the bilateral cervical carotid and vertebral arteries to the skull base. Detail of both carotid bifurcations is degraded, but there is no convincing high-grade carotid stenosis in the neck. Likewise vertebral detail is degraded. The left vertebral artery appears dominant. MRA HEAD FINDINGS Antegrade flow in the posterior circulation. Dominant left vertebral artery. No V4  segment stenosis. Both PICA appear patent. Patent vertebrobasilar junction and basilar artery without stenosis. Patent SCA and PCA origins. There is no proximal left PCA occlusion, the P1 and P2 segments are patent. Distal PCA branch detail is limited. Antegrade flow in both ICA siphons with patent carotid termini. Both siphons appear irregular, but no hemodynamically significant stenosis is evident. The MCA and ACA origins are normal. The anterior communicating artery and visible ACA branches are within normal limits. Both MCA M1 segments and bifurcations are patent. No MCA branch occlusion is identified. IMPRESSION: 1. Acute to subacute confluent Left PCA territory infarct with petechial hemorrhage, but no malignant hemorrhagic transformation or significant mass effect. 2. No other acute intracranial abnormality. 3. Motion degraded head and neck MRA are negative for large vessel occlusion. And no intracranial hemodynamically significant stenosis is identified. Electronically Signed: By: Genevie Ann M.D. On: 03/06/2019 15:31   Mr Brain Wo Contrast  Addendum Date: 03/06/2019   ADDENDUM REPORT: 03/06/2019 16:12 ADDENDUM: Study discussed by telephone with Dr. Wyline Copas on 03/06/2019 at 1550 hours. Electronically Signed   By: Genevie Ann M.D.   On: 03/06/2019 16:12   Result Date: 03/06/2019 CLINICAL DATA:  85 year old female with confusion and evidence of acute to subacute left PCA infarct on head CT yesterday. EXAM: MRI HEAD WITHOUT CONTRAST MRA HEAD WITHOUT CONTRAST MRA NECK WITHOUT CONTRAST TECHNIQUE: Multiplanar, multiecho pulse sequences of the brain and surrounding structures were obtained without intravenous contrast. Angiographic images of the Circle of Willis were obtained using MRA technique without intravenous contrast. Angiographic images of the neck were obtained using MRA technique without intravenous contrast. Carotid stenosis measurements (when applicable) are obtained utilizing NASCET criteria, using the distal  internal carotid diameter as the denominator. COMPARISON:  Head CT without contrast 03/05/2019. FINDINGS: Study is intermittently degraded by motion artifact despite repeated imaging attempts. MRI HEAD FINDINGS Brain: Confluent restricted diffusion in the left PCA territory corresponding to the hypodense area on CT yesterday. Associated hemorrhage on SWI (series 8, image 31) but no significant mass effect. A portion of the dorsal left thalamus is affected. Cytotoxic edema on T2 and FLAIR. No other restricted diffusion.  No midline shift, mass effect, evidence of mass lesion, ventriculomegaly, extra-axial collection or acute intracranial hemorrhage. Cervicomedullary junction and pituitary are within normal limits. Confluent bilateral cerebral white matter T2 and FLAIR hyperintensity. Brainstem and cerebellum appear to remain normal for age. No chronic cerebral blood products identified. Vascular: Major intracranial vascular flow voids are preserved, see below. Skull and upper cervical spine: Cervical spine degraded by motion, grossly negative. Visible bone marrow signal appears normal. Sinuses/Orbits: Paranasal sinuses are clear. Orbits appear stable and negative. Other: Mastoid air cells are clear. Scalp and face soft tissues appear negative. MRA NECK FINDINGS Motion degraded time-of-flight images. There is antegrade flow in the bilateral cervical carotid and vertebral arteries to the skull base. Detail of both carotid bifurcations is degraded, but there is no convincing high-grade carotid stenosis in the neck. Likewise vertebral detail is degraded. The left vertebral artery appears dominant. MRA HEAD FINDINGS Antegrade flow in the posterior circulation. Dominant left vertebral artery. No V4 segment stenosis. Both PICA appear patent. Patent vertebrobasilar junction and basilar artery without stenosis. Patent SCA and PCA origins. There is no proximal left PCA occlusion, the P1 and P2 segments are patent. Distal PCA  branch detail is limited. Antegrade flow in both ICA siphons with patent carotid termini. Both siphons appear irregular, but no hemodynamically significant stenosis is evident. The MCA and ACA origins are normal. The anterior communicating artery and visible ACA branches are within normal limits. Both MCA M1 segments and bifurcations are patent. No MCA branch occlusion is identified. IMPRESSION: 1. Acute to subacute confluent Left PCA territory infarct with petechial hemorrhage, but no malignant hemorrhagic transformation or significant mass effect. 2. No other acute intracranial abnormality. 3. Motion degraded head and neck MRA are negative for large vessel occlusion. And no intracranial hemodynamically significant stenosis is identified. Electronically Signed: By: Odessa Fleming M.D. On: 03/06/2019 15:31   Dg Chest Port 1 View  Result Date: 03/14/2019 CLINICAL DATA:  Short of breath. EXAM: PORTABLE CHEST 1 VIEW COMPARISON:  03/05/2019. FINDINGS: Cardiac silhouette is mildly enlarged. There are bilateral vascular congestion and irregular interstitial thickening with hazy airspace opacities in the mid to lower lungs as well as evidence of small bilateral pleural effusions. Findings are consistent with congestive heart failure. No pneumothorax. Skeletal structures are grossly intact. IMPRESSION: Congestive heart failure. Electronically Signed   By: Amie Portland M.D.   On: 03/14/2019 08:30   Dg Chest Portable 1 View  Result Date: 03/05/2019 CLINICAL DATA:  Fever EXAM: PORTABLE CHEST 1 VIEW COMPARISON:  10/11/2017 chest radiograph. FINDINGS: Stable cardiomediastinal silhouette with mild cardiomegaly. No pneumothorax. No pleural effusion. Suggestion of a mild patchy lingular opacity. No pulmonary edema. IMPRESSION: Suggestion of a mild patchy lingular opacity, cannot exclude pneumonia. Chest radiograph follow-up advised. Electronically Signed   By: Delbert Phenix M.D.   On: 03/05/2019 17:47    Microbiology: Recent  Results (from the past 240 hour(s))  Culture, blood (Routine x 2)     Status: None   Collection Time: 03/05/19  5:15 PM   Specimen: BLOOD  Result Value Ref Range Status   Specimen Description BLOOD RIGHT ANTECUBITAL  Final   Special Requests   Final    BOTTLES DRAWN AEROBIC AND ANAEROBIC Blood Culture results may not be optimal due to an inadequate volume of blood received in culture bottles   Culture   Final    NO GROWTH 5 DAYS Performed at [facility], [address] [place], [place] [address]    Report  Status 03/10/2019 FINAL  Final  Culture, blood (Routine x 2)     Status: None   Collection Time: 03/05/19  5:20 PM   Specimen: BLOOD LEFT FOREARM  Result Value Ref Range Status   Specimen Description BLOOD LEFT FOREARM  Final   Special Requests   Final    BOTTLES DRAWN AEROBIC AND ANAEROBIC Blood Culture results may not be optimal due to an inadequate volume of blood received in culture bottles   Culture   Final    NO GROWTH 5 DAYS Performed at [facility], [address] [place], [place] [address]    Report Status 03/10/2019 FINAL  Final  Urine culture     Status: None   Collection Time: 03/05/19  5:20 PM   Specimen: Urine, Random  Result Value Ref Range Status   Specimen Description URINE, RANDOM  Final   Special Requests   Final    NONE Performed at [facility], [address] [place], [place] [address]    Culture   Final    Multiple bacterial morphotypes present, none predominant. Suggest appropriate recollection if clinically indicated.   Report Status 03/07/2019 FINAL  Final  SARS Coronavirus 2 (CEPHEID - Performed in [facility] lab), Hosp Order     Status: None   Collection Time: 03/05/19  8:14 PM   Specimen: Nasopharyngeal Swab  Result Value Ref Range Status   SARS Coronavirus 2 NEGATIVE NEGATIVE Final    Comment: (NOTE) If result is NEGATIVE SARS-CoV-2 target nucleic acids are NOT DETECTED. The SARS-CoV-2 RNA is generally  detectable in upper and lower  respiratory specimens during the acute phase of infection. The lowest  concentration of SARS-CoV-2 viral copies this assay can detect is 250  copies / mL. A negative result does not preclude SARS-CoV-2 infection  and should not be used as the sole basis for treatment or other  patient management decisions.  A negative result may occur with  improper specimen collection / handling, submission of specimen other  than nasopharyngeal swab, presence of viral mutation(s) within the  areas targeted by this assay, and inadequate number of viral copies  (<250 copies / mL). A negative result must be combined with clinical  observations, patient history, and epidemiological information. If result is POSITIVE SARS-CoV-2 target nucleic acids are DETECTED. The SARS-CoV-2 RNA is generally detectable in upper and lower  respiratory specimens dur ing the acute phase of infection.  Positive  results are indicative of active infection with SARS-CoV-2.  Clinical  correlation with patient history and other diagnostic information is  necessary to determine patient infection status.  Positive results do  not rule out bacterial infection or co-infection with other viruses. If result is PRESUMPTIVE POSTIVE SARS-CoV-2 nucleic acids MAY BE PRESENT.   A presumptive positive result was obtained on the submitted specimen  and confirmed on repeat testing.  While 2019 novel coronavirus  (SARS-CoV-2) nucleic acids may be present in the submitted sample  additional confirmatory testing may be necessary for epidemiological  and / or clinical management purposes  to differentiate between  SARS-CoV-2 and other Sarbecovirus currently known to infect humans.  If clinically indicated additional testing with an alternate test  methodology [phone]) is advised. The SARS-CoV-2 RNA is generally  detectable in upper and lower respiratory sp ecimens during the acute  phase of infection. The  expected result is Negative. Fact Sheet for Patients:  BoilerBrush.com.cyhttps://www.fda.gov/media/136312/download Fact Sheet for Healthcare Providers: https://pope.com/https://www.fda.gov/media/136313/download This test is not yet approved  or cleared by the [place] FDA and has been authorized for detection and/or diagnosis of SARS-CoV-2 by FDA under an Emergency Use Authorization (EUA).  This EUA will remain in effect (meaning this test can be used) for the duration of the COVID-19 declaration under Section 564(b)(1) of the Act, 21 U.S.C. section 360bbb-3(b)(1), unless the authorization is terminated or revoked sooner. Performed at [facility], [address] [place], [place] [address]   Novel Coronavirus,NAA,(SEND-OUT TO REF LAB - TAT 24-48 hrs); Hosp Order     Status: None   Collection Time: 03/09/19  3:45 PM   Specimen: Nasopharyngeal Swab; Respiratory  Result Value Ref Range Status   SARS-CoV-2, NAA NOT DETECTED NOT DETECTED Final    Comment: (NOTE) This test was developed and its performance characteristics determined by [facility]. This test has not been FDA cleared or approved. This test has been authorized by FDA under an Emergency Use Authorization (EUA). This test is only authorized for the duration of time the declaration that circumstances exist justifying the authorization of the emergency use of in vitro diagnostic tests for detection of SARS-CoV-2 virus and/or diagnosis of COVID-19 infection under section 564(b)(1) of the Act, 21 U.S.C. 784ONG-2(X)(5360bbb-3(b)(1), unless the authorization is terminated or revoked sooner. When diagnostic testing is negative, the possibility of a false negative result should be considered in the context of a patient's recent exposures and the presence of clinical signs and symptoms consistent with COVID-19. An individual without symptoms of COVID-19 and who is not shedding SARS-CoV-2 virus would expect to have a negative (not detected) result in this  assay. Performed  At: [facility] [place] [address] [place], [place] [id number] Jolene SchimkeNagendra Sanjai MD NU:[id number]:[phone]    Coronavirus Source NASOPHARYNGEAL  Final    Comment: Performed at [facility], [address] [place], [place] [address]  Novel Coronavirus,NAA,(SEND-OUT TO REF LAB - TAT 24-48 hrs); Hosp Order     Status: None   Collection Time: 03/13/19  2:15 PM   Specimen: Nasopharyngeal Swab; Respiratory  Result Value Ref Range Status   SARS-CoV-2, NAA NOT DETECTED NOT DETECTED Final    Comment: (NOTE) This test was developed and its performance characteristics determined by [facility]. This test has not been FDA cleared or approved. This test has been authorized by FDA under an Emergency Use Authorization (EUA). This test is only authorized for the duration of time the declaration that circumstances exist justifying the authorization of the emergency use of in vitro diagnostic tests for detection of SARS-CoV-2 virus and/or diagnosis of COVID-19 infection under section 564(b)(1) of the Act, 21 U.S.C. 595GLO-7(F)(6360bbb-3(b)(1), unless the authorization is terminated or revoked sooner. When diagnostic testing is negative, the possibility of a false negative result should be considered in the context of a patient's recent exposures and the presence of clinical signs and symptoms consistent with COVID-19. An individual without symptoms of COVID-19 and who is not shedding SARS-CoV-2 virus would expect to have a negative (not detected) result in this assay. Performed  At: [facility] [place] [address] [place], [place] [id number] Jolene SchimkeNagendra Sanjai MD CZ:[id number]:[phone]    Coronavirus Source NASOPHARYNGEAL  Final    Comment: Performed at [facility], [address] [place], [place] [address]     Labs: Basic Metabolic Panel: Recent Labs  Lab 03/09/19 0342 03/10/19 0418 03/15/19 0427  NA 138 139 139  K 3.3* 4.8 3.8  CL 114* 114* 103  CO2 16* 18* 27  GLUCOSE  97  91 81  BUN 13 10 14   CREATININE 0.87 0.84 0.93  CALCIUM 8.0* 8.5* 9.0  MG 1.7 2.2  --    CBC: Recent Labs  Lab 03/09/19 0342 03/10/19 0418 03/11/19 0316 03/12/19 0427 03/13/19 0426  WBC 6.0 5.9 7.0 6.1 5.7  HGB 9.3* 9.1* 9.8* 10.0* 10.1*  HCT 28.8* 28.8* 31.1* 31.4* 32.2*  MCV 83.7 85.0 86.9 86.7 88.7  PLT 215 217 238 262 308   CBG: Recent Labs  Lab 03/14/19 1109 03/14/19 1645 03/14/19 2112 03/15/19 0602 03/15/19 1124  GLUCAP 155* 76 118* 88 106*    Principal Problem:   Acute CVA (cerebrovascular accident) (HCC) Active Problems:   Atrial fibrillation with rapid ventricular response (HCC)   CAP (community acquired pneumonia)   Hypoglycemia   Time coordinating discharge: 45 minutes  Signed:  Brendia Sacks, MD  [facility]  03/15/2019, 12:06 PM

## 2019-03-15 NOTE — Progress Notes (Signed)
Occupational Therapy Treatment Patient Details Name: Sherry [name] MRN: [id number] DOB: 1928-11-05 Today's Date: 03/15/2019    History of present illness 85 y.o. female presenting to the hospital via EMS for evaluation of worsening falls. with medical history significant of COPD, hypertension, hyperlipidemia, CAD status post stent, A. fib not on anticoagulation. Negative CT head. C-Collar in room    OT comments  Pt reporting increased back pain this session and OT offered to provide heat and transfer pt to chair for lunch but pt declined. OT educating and demonstrating aerobic exercise with B UEs while in bed. Pt required cuing to participate and reports, " my back always hurts. You don't know what its like to be 85 years old." Pt agreeable to allow therapist to assist her with repositioning in bed. Call bell and all needed items within reach upon exiting the room.   Follow Up Recommendations  SNF;Supervision/Assistance - 24 hour    Equipment Recommendations  Other (comment)(defer to next venue of care)    Recommendations for Other Services      Precautions / Restrictions Precautions Precautions: Fall Precaution Comments: has c-collar in room, but negative for C spine fxs Restrictions Weight Bearing Restrictions: No       Mobility Bed Mobility Overal bed mobility: Needs Assistance Bed Mobility: Rolling Rolling: Min guard         General bed mobility comments: declined to get out of bed but demonstrated assisted scooting up the bed  Transfers      General transfer comment: declined        ADL either performed or assessed with clinical judgement   ADL       Grooming: Set up;Sitting                    Cognition Arousal/Alertness: Awake/alert Behavior During Therapy: WFL for tasks assessed/performed Overall Cognitive Status: History of cognitive impairments - at baseline         General Comments: pt can be assisted with some occas recues for LE ex's         Exercises Exercises: General Lower Extremity General Exercises - Lower Extremity Ankle Circles/Pumps: AROM;AAROM;Both;10 reps Quad Sets: AROM;Both;10 reps Gluteal Sets: AROM;Both;10 reps Heel Slides: AAROM;Both;10 reps Hip ABduction/ADduction: AROM;AAROM;Both;10 reps Straight Leg Raises: AROM;AAROM;Both;10 reps      General Comments Pt was able to work with PT on ther ex but did not want to stand, but is very energetic and animated to discuss therapy    Pertinent Vitals/ Pain       Pain Assessment: 0-10 Pain Score: 8  Faces Pain Scale: Hurts little more Pain Location: lower back pain Pain Descriptors / Indicators: Aching Pain Intervention(s): Limited activity within patient's tolerance;Monitored during session;Premedicated before session;Repositioned      Frequency  Min 2X/week        Progress Toward Goals  OT Goals(current goals can now be found in the care plan section)  Progress towards OT goals: Not progressing toward goals - comment(limited by pain)  Acute Rehab OT Goals Patient Stated Goal: to go home OT Goal Formulation: With patient Time For Goal Achievement: 03/29/19 Potential to Achieve Goals: Good  Plan Discharge plan remains appropriate       AM-PAC OT "6 Clicks" Daily Activity     Outcome Measure   Help from another person eating meals?: A Little Help from another person taking care of personal grooming?: A Little Help from another person toileting, which includes using toliet, bedpan, or urinal?: A Lot  Help from another person bathing (including washing, rinsing, drying)?: A Lot Help from another person to put on and taking off regular upper body clothing?: A Little Help from another person to put on and taking off regular lower body clothing?: A Lot 6 Click Score: 15    End of Session    OT Visit Diagnosis: Unsteadiness on feet (R26.81);Muscle weakness (generalized) (M62.81);Repeated falls (R29.6)   Activity Tolerance Patient limited  by pain   Patient Left in bed;with bed alarm set   Nurse Communication          Time: [id number] OT Time Calculation (min): 20 min  Charges: OT General Charges $OT Visit: 1 Visit OT Treatments $Self Care/Home Management : 8-22 mins  Vertis Bauder P, MS, OTR/L 03/15/2019, 1:56 PM

## 2019-03-15 NOTE — TOC Transition Note (Signed)
Transition of Care [facility]) - CM/SW Discharge Note   Patient Details  Name: KINLEE GARRISON MRN: [id number] Date of Birth: [birth year]  [facility]) CM/SW Contact:  Geralynn Ochs, LCSW Phone Number: 03/15/2019, 1:36 PM   Clinical Narrative:  Nurse to call report to [phone], Room 1205P.     Final next level of care: Skilled Nursing Facility Barriers to Discharge: Barriers Resolved   Patient Goals and CMS Choice   CMS Medicare.gov Compare Post Acute Care list provided to:: Patient Represenative (must comment)(daughter emailed list under [email or web address]) Choice offered to / list presented to : Adult Children  Discharge Placement              Patient [facility] bed at: [facility] Patient to be transferred to facility by: [place] Name of family member notified: Ann Patient and family notified of of transfer: 03/15/19  Discharge Plan and Services In-house Referral: Clinical Social Work Discharge Planning Services: CM Consult Post Acute Care Choice: [place]                               Social Determinants of Health (SDOH) Interventions     Readmission Risk Interventions No flowsheet data found.

## 2019-03-15 NOTE — Progress Notes (Signed)
Pt d/c to [facility]. Pt has no new concerns, pt is stable and saturation is 96 on 2l. Pt will be transported out of facility by [facility]. D/C instructions given to receiving RN Verline Lema at [facility]

## 2019-03-16 DIAGNOSIS — J188 Other pneumonia, unspecified organism: Secondary | ICD-10-CM | POA: Diagnosis not present

## 2019-03-16 DIAGNOSIS — I480 Paroxysmal atrial fibrillation: Secondary | ICD-10-CM | POA: Diagnosis not present

## 2019-03-16 DIAGNOSIS — E161 Other hypoglycemia: Secondary | ICD-10-CM | POA: Diagnosis not present

## 2019-03-16 DIAGNOSIS — I6389 Other cerebral infarction: Secondary | ICD-10-CM | POA: Diagnosis not present

## 2019-03-25 DIAGNOSIS — J984 Other disorders of lung: Secondary | ICD-10-CM | POA: Diagnosis not present

## 2019-03-25 DIAGNOSIS — F0789 Other personality and behavioral disorders due to known physiological condition: Secondary | ICD-10-CM | POA: Diagnosis not present

## 2019-03-25 DIAGNOSIS — I5032 Chronic diastolic (congestive) heart failure: Secondary | ICD-10-CM | POA: Diagnosis not present

## 2019-03-25 DIAGNOSIS — R5381 Other malaise: Secondary | ICD-10-CM | POA: Diagnosis not present

## 2019-03-25 DIAGNOSIS — I7389 Other specified peripheral vascular diseases: Secondary | ICD-10-CM | POA: Diagnosis not present

## 2019-03-25 DIAGNOSIS — I6389 Other cerebral infarction: Secondary | ICD-10-CM | POA: Diagnosis not present

## 2019-03-25 DIAGNOSIS — I480 Paroxysmal atrial fibrillation: Secondary | ICD-10-CM | POA: Diagnosis not present

## 2019-03-25 DIAGNOSIS — D6489 Other specified anemias: Secondary | ICD-10-CM | POA: Diagnosis not present

## 2019-03-30 DIAGNOSIS — I10 Essential (primary) hypertension: Secondary | ICD-10-CM | POA: Diagnosis not present

## 2019-03-30 DIAGNOSIS — E161 Other hypoglycemia: Secondary | ICD-10-CM | POA: Diagnosis not present

## 2019-03-30 DIAGNOSIS — R296 Repeated falls: Secondary | ICD-10-CM | POA: Diagnosis not present

## 2019-04-02 DIAGNOSIS — I5031 Acute diastolic (congestive) heart failure: Secondary | ICD-10-CM | POA: Diagnosis not present

## 2019-04-02 DIAGNOSIS — F0789 Other personality and behavioral disorders due to known physiological condition: Secondary | ICD-10-CM | POA: Diagnosis not present

## 2019-04-02 DIAGNOSIS — I7389 Other specified peripheral vascular diseases: Secondary | ICD-10-CM | POA: Diagnosis not present

## 2019-04-02 DIAGNOSIS — R41841 Cognitive communication deficit: Secondary | ICD-10-CM | POA: Diagnosis not present

## 2019-04-02 DIAGNOSIS — I639 Cerebral infarction, unspecified: Secondary | ICD-10-CM | POA: Diagnosis not present

## 2019-04-02 DIAGNOSIS — I480 Paroxysmal atrial fibrillation: Secondary | ICD-10-CM | POA: Diagnosis not present

## 2019-04-02 DIAGNOSIS — I6389 Other cerebral infarction: Secondary | ICD-10-CM | POA: Diagnosis not present

## 2019-04-02 DIAGNOSIS — R2681 Unsteadiness on feet: Secondary | ICD-10-CM | POA: Diagnosis not present

## 2019-04-02 DIAGNOSIS — J188 Other pneumonia, unspecified organism: Secondary | ICD-10-CM | POA: Diagnosis not present

## 2019-04-02 DIAGNOSIS — M6281 Muscle weakness (generalized): Secondary | ICD-10-CM | POA: Diagnosis not present

## 2019-04-02 DIAGNOSIS — E161 Other hypoglycemia: Secondary | ICD-10-CM | POA: Diagnosis not present

## 2019-04-02 DIAGNOSIS — J984 Other disorders of lung: Secondary | ICD-10-CM | POA: Diagnosis not present

## 2019-04-02 DIAGNOSIS — I10 Essential (primary) hypertension: Secondary | ICD-10-CM | POA: Diagnosis not present

## 2019-04-02 DIAGNOSIS — R278 Other lack of coordination: Secondary | ICD-10-CM | POA: Diagnosis not present

## 2019-04-02 DIAGNOSIS — Z9181 History of falling: Secondary | ICD-10-CM | POA: Diagnosis not present

## 2019-04-02 DIAGNOSIS — R2689 Other abnormalities of gait and mobility: Secondary | ICD-10-CM | POA: Diagnosis not present

## 2019-04-02 DIAGNOSIS — F09 Unspecified mental disorder due to known physiological condition: Secondary | ICD-10-CM | POA: Diagnosis not present

## 2019-04-02 DIAGNOSIS — R1312 Dysphagia, oropharyngeal phase: Secondary | ICD-10-CM | POA: Diagnosis not present

## 2019-04-05 DIAGNOSIS — F0789 Other personality and behavioral disorders due to known physiological condition: Secondary | ICD-10-CM | POA: Diagnosis not present

## 2019-04-05 DIAGNOSIS — I10 Essential (primary) hypertension: Secondary | ICD-10-CM | POA: Diagnosis not present

## 2019-04-05 DIAGNOSIS — J984 Other disorders of lung: Secondary | ICD-10-CM | POA: Diagnosis not present

## 2019-04-05 DIAGNOSIS — I7389 Other specified peripheral vascular diseases: Secondary | ICD-10-CM | POA: Diagnosis not present

## 2019-04-05 DIAGNOSIS — I6389 Other cerebral infarction: Secondary | ICD-10-CM | POA: Diagnosis not present

## 2019-04-05 DIAGNOSIS — I480 Paroxysmal atrial fibrillation: Secondary | ICD-10-CM | POA: Diagnosis not present

## 2019-04-05 DIAGNOSIS — I5031 Acute diastolic (congestive) heart failure: Secondary | ICD-10-CM | POA: Diagnosis not present

## 2019-04-13 DIAGNOSIS — I5031 Acute diastolic (congestive) heart failure: Secondary | ICD-10-CM | POA: Diagnosis not present

## 2019-04-13 DIAGNOSIS — I6389 Other cerebral infarction: Secondary | ICD-10-CM | POA: Diagnosis not present

## 2019-04-13 DIAGNOSIS — I480 Paroxysmal atrial fibrillation: Secondary | ICD-10-CM | POA: Diagnosis not present

## 2019-04-13 DIAGNOSIS — I10 Essential (primary) hypertension: Secondary | ICD-10-CM | POA: Diagnosis not present

## 2019-04-13 DIAGNOSIS — J188 Other pneumonia, unspecified organism: Secondary | ICD-10-CM | POA: Diagnosis not present

## 2019-04-13 DIAGNOSIS — F09 Unspecified mental disorder due to known physiological condition: Secondary | ICD-10-CM | POA: Diagnosis not present

## 2019-04-13 DIAGNOSIS — I7389 Other specified peripheral vascular diseases: Secondary | ICD-10-CM | POA: Diagnosis not present

## 2019-04-13 DIAGNOSIS — E161 Other hypoglycemia: Secondary | ICD-10-CM | POA: Diagnosis not present

## 2019-04-13 DIAGNOSIS — J984 Other disorders of lung: Secondary | ICD-10-CM | POA: Diagnosis not present

## 2019-04-16 DIAGNOSIS — I69398 Other sequelae of cerebral infarction: Secondary | ICD-10-CM | POA: Diagnosis not present

## 2019-04-16 DIAGNOSIS — I5033 Acute on chronic diastolic (congestive) heart failure: Secondary | ICD-10-CM | POA: Diagnosis not present

## 2019-04-16 DIAGNOSIS — R296 Repeated falls: Secondary | ICD-10-CM | POA: Diagnosis not present

## 2019-04-16 DIAGNOSIS — H53461 Homonymous bilateral field defects, right side: Secondary | ICD-10-CM | POA: Diagnosis not present

## 2019-04-16 DIAGNOSIS — I482 Chronic atrial fibrillation, unspecified: Secondary | ICD-10-CM | POA: Diagnosis not present

## 2019-04-16 DIAGNOSIS — I110 Hypertensive heart disease with heart failure: Secondary | ICD-10-CM | POA: Diagnosis not present

## 2019-04-16 DIAGNOSIS — I2510 Atherosclerotic heart disease of native coronary artery without angina pectoris: Secondary | ICD-10-CM | POA: Diagnosis not present

## 2019-04-16 DIAGNOSIS — I495 Sick sinus syndrome: Secondary | ICD-10-CM | POA: Diagnosis not present

## 2019-04-16 DIAGNOSIS — F0390 Unspecified dementia without behavioral disturbance: Secondary | ICD-10-CM | POA: Diagnosis not present

## 2019-04-16 DIAGNOSIS — I0981 Rheumatic heart failure: Secondary | ICD-10-CM | POA: Diagnosis not present

## 2019-04-16 DIAGNOSIS — I071 Rheumatic tricuspid insufficiency: Secondary | ICD-10-CM | POA: Diagnosis not present

## 2019-04-19 DIAGNOSIS — I482 Chronic atrial fibrillation, unspecified: Secondary | ICD-10-CM | POA: Diagnosis not present

## 2019-04-19 DIAGNOSIS — I0981 Rheumatic heart failure: Secondary | ICD-10-CM | POA: Diagnosis not present

## 2019-04-19 DIAGNOSIS — I071 Rheumatic tricuspid insufficiency: Secondary | ICD-10-CM | POA: Diagnosis not present

## 2019-04-19 DIAGNOSIS — I2510 Atherosclerotic heart disease of native coronary artery without angina pectoris: Secondary | ICD-10-CM | POA: Diagnosis not present

## 2019-04-19 DIAGNOSIS — I69398 Other sequelae of cerebral infarction: Secondary | ICD-10-CM | POA: Diagnosis not present

## 2019-04-19 DIAGNOSIS — I110 Hypertensive heart disease with heart failure: Secondary | ICD-10-CM | POA: Diagnosis not present

## 2019-04-19 DIAGNOSIS — I5033 Acute on chronic diastolic (congestive) heart failure: Secondary | ICD-10-CM | POA: Diagnosis not present

## 2019-04-19 DIAGNOSIS — H53461 Homonymous bilateral field defects, right side: Secondary | ICD-10-CM | POA: Diagnosis not present

## 2019-04-19 DIAGNOSIS — R296 Repeated falls: Secondary | ICD-10-CM | POA: Diagnosis not present

## 2019-04-20 DIAGNOSIS — I110 Hypertensive heart disease with heart failure: Secondary | ICD-10-CM | POA: Diagnosis not present

## 2019-04-20 DIAGNOSIS — I5033 Acute on chronic diastolic (congestive) heart failure: Secondary | ICD-10-CM | POA: Diagnosis not present

## 2019-04-20 DIAGNOSIS — R296 Repeated falls: Secondary | ICD-10-CM | POA: Diagnosis not present

## 2019-04-20 DIAGNOSIS — I69398 Other sequelae of cerebral infarction: Secondary | ICD-10-CM | POA: Diagnosis not present

## 2019-04-20 DIAGNOSIS — I071 Rheumatic tricuspid insufficiency: Secondary | ICD-10-CM | POA: Diagnosis not present

## 2019-04-20 DIAGNOSIS — I482 Chronic atrial fibrillation, unspecified: Secondary | ICD-10-CM | POA: Diagnosis not present

## 2019-04-20 DIAGNOSIS — I0981 Rheumatic heart failure: Secondary | ICD-10-CM | POA: Diagnosis not present

## 2019-04-20 DIAGNOSIS — H53461 Homonymous bilateral field defects, right side: Secondary | ICD-10-CM | POA: Diagnosis not present

## 2019-04-20 DIAGNOSIS — I2510 Atherosclerotic heart disease of native coronary artery without angina pectoris: Secondary | ICD-10-CM | POA: Diagnosis not present

## 2019-04-22 DIAGNOSIS — E440 Moderate protein-calorie malnutrition: Secondary | ICD-10-CM | POA: Diagnosis not present

## 2019-04-22 DIAGNOSIS — E7800 Pure hypercholesterolemia, unspecified: Secondary | ICD-10-CM | POA: Diagnosis not present

## 2019-04-22 DIAGNOSIS — I5032 Chronic diastolic (congestive) heart failure: Secondary | ICD-10-CM | POA: Diagnosis not present

## 2019-04-22 DIAGNOSIS — D649 Anemia, unspecified: Secondary | ICD-10-CM | POA: Diagnosis not present

## 2019-04-22 DIAGNOSIS — I482 Chronic atrial fibrillation, unspecified: Secondary | ICD-10-CM | POA: Diagnosis not present

## 2019-04-22 DIAGNOSIS — I712 Thoracic aortic aneurysm, without rupture: Secondary | ICD-10-CM | POA: Diagnosis not present

## 2019-04-25 DIAGNOSIS — R296 Repeated falls: Secondary | ICD-10-CM | POA: Diagnosis not present

## 2019-04-25 DIAGNOSIS — I5033 Acute on chronic diastolic (congestive) heart failure: Secondary | ICD-10-CM | POA: Diagnosis not present

## 2019-04-25 DIAGNOSIS — H53461 Homonymous bilateral field defects, right side: Secondary | ICD-10-CM | POA: Diagnosis not present

## 2019-04-25 DIAGNOSIS — I0981 Rheumatic heart failure: Secondary | ICD-10-CM | POA: Diagnosis not present

## 2019-04-25 DIAGNOSIS — I482 Chronic atrial fibrillation, unspecified: Secondary | ICD-10-CM | POA: Diagnosis not present

## 2019-04-25 DIAGNOSIS — I110 Hypertensive heart disease with heart failure: Secondary | ICD-10-CM | POA: Diagnosis not present

## 2019-04-25 DIAGNOSIS — I69398 Other sequelae of cerebral infarction: Secondary | ICD-10-CM | POA: Diagnosis not present

## 2019-04-25 DIAGNOSIS — I071 Rheumatic tricuspid insufficiency: Secondary | ICD-10-CM | POA: Diagnosis not present

## 2019-04-25 DIAGNOSIS — I2510 Atherosclerotic heart disease of native coronary artery without angina pectoris: Secondary | ICD-10-CM | POA: Diagnosis not present

## 2019-04-26 ENCOUNTER — Ambulatory Visit: Payer: Medicare HMO | Admitting: Cardiovascular Disease

## 2019-04-26 DIAGNOSIS — I69398 Other sequelae of cerebral infarction: Secondary | ICD-10-CM | POA: Diagnosis not present

## 2019-04-26 DIAGNOSIS — I482 Chronic atrial fibrillation, unspecified: Secondary | ICD-10-CM | POA: Diagnosis not present

## 2019-04-26 DIAGNOSIS — I2510 Atherosclerotic heart disease of native coronary artery without angina pectoris: Secondary | ICD-10-CM | POA: Diagnosis not present

## 2019-04-26 DIAGNOSIS — I110 Hypertensive heart disease with heart failure: Secondary | ICD-10-CM | POA: Diagnosis not present

## 2019-04-26 DIAGNOSIS — I5033 Acute on chronic diastolic (congestive) heart failure: Secondary | ICD-10-CM | POA: Diagnosis not present

## 2019-04-26 DIAGNOSIS — I071 Rheumatic tricuspid insufficiency: Secondary | ICD-10-CM | POA: Diagnosis not present

## 2019-04-26 DIAGNOSIS — I0981 Rheumatic heart failure: Secondary | ICD-10-CM | POA: Diagnosis not present

## 2019-04-26 DIAGNOSIS — R296 Repeated falls: Secondary | ICD-10-CM | POA: Diagnosis not present

## 2019-04-26 DIAGNOSIS — H53461 Homonymous bilateral field defects, right side: Secondary | ICD-10-CM | POA: Diagnosis not present

## 2019-04-27 DIAGNOSIS — I0981 Rheumatic heart failure: Secondary | ICD-10-CM | POA: Diagnosis not present

## 2019-04-27 DIAGNOSIS — I071 Rheumatic tricuspid insufficiency: Secondary | ICD-10-CM | POA: Diagnosis not present

## 2019-04-27 DIAGNOSIS — I69398 Other sequelae of cerebral infarction: Secondary | ICD-10-CM | POA: Diagnosis not present

## 2019-04-27 DIAGNOSIS — R296 Repeated falls: Secondary | ICD-10-CM | POA: Diagnosis not present

## 2019-04-27 DIAGNOSIS — H53461 Homonymous bilateral field defects, right side: Secondary | ICD-10-CM | POA: Diagnosis not present

## 2019-04-27 DIAGNOSIS — I2510 Atherosclerotic heart disease of native coronary artery without angina pectoris: Secondary | ICD-10-CM | POA: Diagnosis not present

## 2019-04-27 DIAGNOSIS — I482 Chronic atrial fibrillation, unspecified: Secondary | ICD-10-CM | POA: Diagnosis not present

## 2019-04-27 DIAGNOSIS — I5033 Acute on chronic diastolic (congestive) heart failure: Secondary | ICD-10-CM | POA: Diagnosis not present

## 2019-04-27 DIAGNOSIS — I110 Hypertensive heart disease with heart failure: Secondary | ICD-10-CM | POA: Diagnosis not present

## 2019-04-28 DIAGNOSIS — I5033 Acute on chronic diastolic (congestive) heart failure: Secondary | ICD-10-CM | POA: Diagnosis not present

## 2019-04-28 DIAGNOSIS — I69398 Other sequelae of cerebral infarction: Secondary | ICD-10-CM | POA: Diagnosis not present

## 2019-04-28 DIAGNOSIS — R296 Repeated falls: Secondary | ICD-10-CM | POA: Diagnosis not present

## 2019-04-28 DIAGNOSIS — I110 Hypertensive heart disease with heart failure: Secondary | ICD-10-CM | POA: Diagnosis not present

## 2019-04-28 DIAGNOSIS — I482 Chronic atrial fibrillation, unspecified: Secondary | ICD-10-CM | POA: Diagnosis not present

## 2019-04-28 DIAGNOSIS — I0981 Rheumatic heart failure: Secondary | ICD-10-CM | POA: Diagnosis not present

## 2019-04-28 DIAGNOSIS — I2510 Atherosclerotic heart disease of native coronary artery without angina pectoris: Secondary | ICD-10-CM | POA: Diagnosis not present

## 2019-04-28 DIAGNOSIS — I071 Rheumatic tricuspid insufficiency: Secondary | ICD-10-CM | POA: Diagnosis not present

## 2019-04-28 DIAGNOSIS — H53461 Homonymous bilateral field defects, right side: Secondary | ICD-10-CM | POA: Diagnosis not present

## 2019-05-02 DIAGNOSIS — H53461 Homonymous bilateral field defects, right side: Secondary | ICD-10-CM | POA: Diagnosis not present

## 2019-05-02 DIAGNOSIS — I482 Chronic atrial fibrillation, unspecified: Secondary | ICD-10-CM | POA: Diagnosis not present

## 2019-05-02 DIAGNOSIS — I69398 Other sequelae of cerebral infarction: Secondary | ICD-10-CM | POA: Diagnosis not present

## 2019-05-02 DIAGNOSIS — I110 Hypertensive heart disease with heart failure: Secondary | ICD-10-CM | POA: Diagnosis not present

## 2019-05-02 DIAGNOSIS — I0981 Rheumatic heart failure: Secondary | ICD-10-CM | POA: Diagnosis not present

## 2019-05-02 DIAGNOSIS — I2510 Atherosclerotic heart disease of native coronary artery without angina pectoris: Secondary | ICD-10-CM | POA: Diagnosis not present

## 2019-05-02 DIAGNOSIS — I5033 Acute on chronic diastolic (congestive) heart failure: Secondary | ICD-10-CM | POA: Diagnosis not present

## 2019-05-02 DIAGNOSIS — I071 Rheumatic tricuspid insufficiency: Secondary | ICD-10-CM | POA: Diagnosis not present

## 2019-05-02 DIAGNOSIS — R296 Repeated falls: Secondary | ICD-10-CM | POA: Diagnosis not present

## 2019-05-03 DIAGNOSIS — I2510 Atherosclerotic heart disease of native coronary artery without angina pectoris: Secondary | ICD-10-CM | POA: Diagnosis not present

## 2019-05-03 DIAGNOSIS — I0981 Rheumatic heart failure: Secondary | ICD-10-CM | POA: Diagnosis not present

## 2019-05-03 DIAGNOSIS — I5033 Acute on chronic diastolic (congestive) heart failure: Secondary | ICD-10-CM | POA: Diagnosis not present

## 2019-05-03 DIAGNOSIS — H53461 Homonymous bilateral field defects, right side: Secondary | ICD-10-CM | POA: Diagnosis not present

## 2019-05-03 DIAGNOSIS — I110 Hypertensive heart disease with heart failure: Secondary | ICD-10-CM | POA: Diagnosis not present

## 2019-05-03 DIAGNOSIS — I071 Rheumatic tricuspid insufficiency: Secondary | ICD-10-CM | POA: Diagnosis not present

## 2019-05-03 DIAGNOSIS — I69398 Other sequelae of cerebral infarction: Secondary | ICD-10-CM | POA: Diagnosis not present

## 2019-05-03 DIAGNOSIS — I482 Chronic atrial fibrillation, unspecified: Secondary | ICD-10-CM | POA: Diagnosis not present

## 2019-05-03 DIAGNOSIS — R296 Repeated falls: Secondary | ICD-10-CM | POA: Diagnosis not present

## 2019-05-04 DIAGNOSIS — R296 Repeated falls: Secondary | ICD-10-CM | POA: Diagnosis not present

## 2019-05-04 DIAGNOSIS — I482 Chronic atrial fibrillation, unspecified: Secondary | ICD-10-CM | POA: Diagnosis not present

## 2019-05-04 DIAGNOSIS — I5033 Acute on chronic diastolic (congestive) heart failure: Secondary | ICD-10-CM | POA: Diagnosis not present

## 2019-05-04 DIAGNOSIS — I071 Rheumatic tricuspid insufficiency: Secondary | ICD-10-CM | POA: Diagnosis not present

## 2019-05-04 DIAGNOSIS — I69398 Other sequelae of cerebral infarction: Secondary | ICD-10-CM | POA: Diagnosis not present

## 2019-05-04 DIAGNOSIS — I0981 Rheumatic heart failure: Secondary | ICD-10-CM | POA: Diagnosis not present

## 2019-05-04 DIAGNOSIS — H53461 Homonymous bilateral field defects, right side: Secondary | ICD-10-CM | POA: Diagnosis not present

## 2019-05-04 DIAGNOSIS — I110 Hypertensive heart disease with heart failure: Secondary | ICD-10-CM | POA: Diagnosis not present

## 2019-05-04 DIAGNOSIS — I2510 Atherosclerotic heart disease of native coronary artery without angina pectoris: Secondary | ICD-10-CM | POA: Diagnosis not present

## 2019-05-05 DIAGNOSIS — I110 Hypertensive heart disease with heart failure: Secondary | ICD-10-CM | POA: Diagnosis not present

## 2019-05-05 DIAGNOSIS — I2510 Atherosclerotic heart disease of native coronary artery without angina pectoris: Secondary | ICD-10-CM | POA: Diagnosis not present

## 2019-05-05 DIAGNOSIS — I0981 Rheumatic heart failure: Secondary | ICD-10-CM | POA: Diagnosis not present

## 2019-05-05 DIAGNOSIS — H53461 Homonymous bilateral field defects, right side: Secondary | ICD-10-CM | POA: Diagnosis not present

## 2019-05-05 DIAGNOSIS — I5033 Acute on chronic diastolic (congestive) heart failure: Secondary | ICD-10-CM | POA: Diagnosis not present

## 2019-05-05 DIAGNOSIS — I071 Rheumatic tricuspid insufficiency: Secondary | ICD-10-CM | POA: Diagnosis not present

## 2019-05-05 DIAGNOSIS — I482 Chronic atrial fibrillation, unspecified: Secondary | ICD-10-CM | POA: Diagnosis not present

## 2019-05-05 DIAGNOSIS — R296 Repeated falls: Secondary | ICD-10-CM | POA: Diagnosis not present

## 2019-05-05 DIAGNOSIS — I69398 Other sequelae of cerebral infarction: Secondary | ICD-10-CM | POA: Diagnosis not present

## 2019-05-10 DIAGNOSIS — I69398 Other sequelae of cerebral infarction: Secondary | ICD-10-CM | POA: Diagnosis not present

## 2019-05-10 DIAGNOSIS — I482 Chronic atrial fibrillation, unspecified: Secondary | ICD-10-CM | POA: Diagnosis not present

## 2019-05-10 DIAGNOSIS — I071 Rheumatic tricuspid insufficiency: Secondary | ICD-10-CM | POA: Diagnosis not present

## 2019-05-10 DIAGNOSIS — I5033 Acute on chronic diastolic (congestive) heart failure: Secondary | ICD-10-CM | POA: Diagnosis not present

## 2019-05-10 DIAGNOSIS — I0981 Rheumatic heart failure: Secondary | ICD-10-CM | POA: Diagnosis not present

## 2019-05-10 DIAGNOSIS — R296 Repeated falls: Secondary | ICD-10-CM | POA: Diagnosis not present

## 2019-05-10 DIAGNOSIS — H53461 Homonymous bilateral field defects, right side: Secondary | ICD-10-CM | POA: Diagnosis not present

## 2019-05-10 DIAGNOSIS — I110 Hypertensive heart disease with heart failure: Secondary | ICD-10-CM | POA: Diagnosis not present

## 2019-05-10 DIAGNOSIS — I2510 Atherosclerotic heart disease of native coronary artery without angina pectoris: Secondary | ICD-10-CM | POA: Diagnosis not present

## 2019-05-11 DIAGNOSIS — I110 Hypertensive heart disease with heart failure: Secondary | ICD-10-CM | POA: Diagnosis not present

## 2019-05-11 DIAGNOSIS — I2510 Atherosclerotic heart disease of native coronary artery without angina pectoris: Secondary | ICD-10-CM | POA: Diagnosis not present

## 2019-05-11 DIAGNOSIS — H53461 Homonymous bilateral field defects, right side: Secondary | ICD-10-CM | POA: Diagnosis not present

## 2019-05-11 DIAGNOSIS — I482 Chronic atrial fibrillation, unspecified: Secondary | ICD-10-CM | POA: Diagnosis not present

## 2019-05-11 DIAGNOSIS — I071 Rheumatic tricuspid insufficiency: Secondary | ICD-10-CM | POA: Diagnosis not present

## 2019-05-11 DIAGNOSIS — I5033 Acute on chronic diastolic (congestive) heart failure: Secondary | ICD-10-CM | POA: Diagnosis not present

## 2019-05-11 DIAGNOSIS — R296 Repeated falls: Secondary | ICD-10-CM | POA: Diagnosis not present

## 2019-05-11 DIAGNOSIS — I69398 Other sequelae of cerebral infarction: Secondary | ICD-10-CM | POA: Diagnosis not present

## 2019-05-11 DIAGNOSIS — I0981 Rheumatic heart failure: Secondary | ICD-10-CM | POA: Diagnosis not present

## 2019-05-12 DIAGNOSIS — I5033 Acute on chronic diastolic (congestive) heart failure: Secondary | ICD-10-CM | POA: Diagnosis not present

## 2019-05-12 DIAGNOSIS — I0981 Rheumatic heart failure: Secondary | ICD-10-CM | POA: Diagnosis not present

## 2019-05-12 DIAGNOSIS — I482 Chronic atrial fibrillation, unspecified: Secondary | ICD-10-CM | POA: Diagnosis not present

## 2019-05-12 DIAGNOSIS — I110 Hypertensive heart disease with heart failure: Secondary | ICD-10-CM | POA: Diagnosis not present

## 2019-05-12 DIAGNOSIS — I071 Rheumatic tricuspid insufficiency: Secondary | ICD-10-CM | POA: Diagnosis not present

## 2019-05-12 DIAGNOSIS — I69398 Other sequelae of cerebral infarction: Secondary | ICD-10-CM | POA: Diagnosis not present

## 2019-05-12 DIAGNOSIS — H53461 Homonymous bilateral field defects, right side: Secondary | ICD-10-CM | POA: Diagnosis not present

## 2019-05-12 DIAGNOSIS — R296 Repeated falls: Secondary | ICD-10-CM | POA: Diagnosis not present

## 2019-05-12 DIAGNOSIS — I2510 Atherosclerotic heart disease of native coronary artery without angina pectoris: Secondary | ICD-10-CM | POA: Diagnosis not present

## 2019-05-13 DIAGNOSIS — I0981 Rheumatic heart failure: Secondary | ICD-10-CM | POA: Diagnosis not present

## 2019-05-13 DIAGNOSIS — R296 Repeated falls: Secondary | ICD-10-CM | POA: Diagnosis not present

## 2019-05-13 DIAGNOSIS — I2510 Atherosclerotic heart disease of native coronary artery without angina pectoris: Secondary | ICD-10-CM | POA: Diagnosis not present

## 2019-05-13 DIAGNOSIS — I482 Chronic atrial fibrillation, unspecified: Secondary | ICD-10-CM | POA: Diagnosis not present

## 2019-05-13 DIAGNOSIS — I5033 Acute on chronic diastolic (congestive) heart failure: Secondary | ICD-10-CM | POA: Diagnosis not present

## 2019-05-13 DIAGNOSIS — I071 Rheumatic tricuspid insufficiency: Secondary | ICD-10-CM | POA: Diagnosis not present

## 2019-05-13 DIAGNOSIS — I69398 Other sequelae of cerebral infarction: Secondary | ICD-10-CM | POA: Diagnosis not present

## 2019-05-13 DIAGNOSIS — I110 Hypertensive heart disease with heart failure: Secondary | ICD-10-CM | POA: Diagnosis not present

## 2019-05-13 DIAGNOSIS — H53461 Homonymous bilateral field defects, right side: Secondary | ICD-10-CM | POA: Diagnosis not present

## 2019-05-16 DIAGNOSIS — R296 Repeated falls: Secondary | ICD-10-CM | POA: Diagnosis not present

## 2019-05-16 DIAGNOSIS — I5033 Acute on chronic diastolic (congestive) heart failure: Secondary | ICD-10-CM | POA: Diagnosis not present

## 2019-05-16 DIAGNOSIS — I110 Hypertensive heart disease with heart failure: Secondary | ICD-10-CM | POA: Diagnosis not present

## 2019-05-16 DIAGNOSIS — I2510 Atherosclerotic heart disease of native coronary artery without angina pectoris: Secondary | ICD-10-CM | POA: Diagnosis not present

## 2019-05-16 DIAGNOSIS — I69398 Other sequelae of cerebral infarction: Secondary | ICD-10-CM | POA: Diagnosis not present

## 2019-05-16 DIAGNOSIS — I071 Rheumatic tricuspid insufficiency: Secondary | ICD-10-CM | POA: Diagnosis not present

## 2019-05-16 DIAGNOSIS — I482 Chronic atrial fibrillation, unspecified: Secondary | ICD-10-CM | POA: Diagnosis not present

## 2019-05-16 DIAGNOSIS — I0981 Rheumatic heart failure: Secondary | ICD-10-CM | POA: Diagnosis not present

## 2019-05-16 DIAGNOSIS — H53461 Homonymous bilateral field defects, right side: Secondary | ICD-10-CM | POA: Diagnosis not present

## 2019-05-19 DIAGNOSIS — I0981 Rheumatic heart failure: Secondary | ICD-10-CM | POA: Diagnosis not present

## 2019-05-19 DIAGNOSIS — R296 Repeated falls: Secondary | ICD-10-CM | POA: Diagnosis not present

## 2019-05-19 DIAGNOSIS — I071 Rheumatic tricuspid insufficiency: Secondary | ICD-10-CM | POA: Diagnosis not present

## 2019-05-19 DIAGNOSIS — H53461 Homonymous bilateral field defects, right side: Secondary | ICD-10-CM | POA: Diagnosis not present

## 2019-05-19 DIAGNOSIS — I5033 Acute on chronic diastolic (congestive) heart failure: Secondary | ICD-10-CM | POA: Diagnosis not present

## 2019-05-19 DIAGNOSIS — I110 Hypertensive heart disease with heart failure: Secondary | ICD-10-CM | POA: Diagnosis not present

## 2019-05-19 DIAGNOSIS — I482 Chronic atrial fibrillation, unspecified: Secondary | ICD-10-CM | POA: Diagnosis not present

## 2019-05-19 DIAGNOSIS — I69398 Other sequelae of cerebral infarction: Secondary | ICD-10-CM | POA: Diagnosis not present

## 2019-05-19 DIAGNOSIS — I2510 Atherosclerotic heart disease of native coronary artery without angina pectoris: Secondary | ICD-10-CM | POA: Diagnosis not present

## 2019-05-23 DIAGNOSIS — I69398 Other sequelae of cerebral infarction: Secondary | ICD-10-CM | POA: Diagnosis not present

## 2019-05-23 DIAGNOSIS — I482 Chronic atrial fibrillation, unspecified: Secondary | ICD-10-CM | POA: Diagnosis not present

## 2019-05-23 DIAGNOSIS — R296 Repeated falls: Secondary | ICD-10-CM | POA: Diagnosis not present

## 2019-05-23 DIAGNOSIS — I0981 Rheumatic heart failure: Secondary | ICD-10-CM | POA: Diagnosis not present

## 2019-05-23 DIAGNOSIS — I110 Hypertensive heart disease with heart failure: Secondary | ICD-10-CM | POA: Diagnosis not present

## 2019-05-23 DIAGNOSIS — I071 Rheumatic tricuspid insufficiency: Secondary | ICD-10-CM | POA: Diagnosis not present

## 2019-05-23 DIAGNOSIS — I2510 Atherosclerotic heart disease of native coronary artery without angina pectoris: Secondary | ICD-10-CM | POA: Diagnosis not present

## 2019-05-23 DIAGNOSIS — I5033 Acute on chronic diastolic (congestive) heart failure: Secondary | ICD-10-CM | POA: Diagnosis not present

## 2019-05-23 DIAGNOSIS — H53461 Homonymous bilateral field defects, right side: Secondary | ICD-10-CM | POA: Diagnosis not present

## 2019-05-29 ENCOUNTER — Inpatient Hospital Stay (HOSPITAL_COMMUNITY)
Admission: EM | Admit: 2019-05-29 | Discharge: 2019-06-02 | DRG: 314 | Disposition: A | Discharge status: Home Health | Payer: Medicare HMO | Attending: Family Medicine | Admitting: Family Medicine

## 2019-05-29 ENCOUNTER — Encounter (HOSPITAL_COMMUNITY): Payer: Self-pay

## 2019-05-29 ENCOUNTER — Emergency Department (HOSPITAL_COMMUNITY): Payer: Medicare HMO

## 2019-05-29 ENCOUNTER — Other Ambulatory Visit: Payer: Self-pay

## 2019-05-29 DIAGNOSIS — R000 Tachycardia, unspecified: Secondary | ICD-10-CM | POA: Diagnosis not present

## 2019-05-29 DIAGNOSIS — N289 Disorder of kidney and ureter, unspecified: Secondary | ICD-10-CM | POA: Diagnosis present

## 2019-05-29 DIAGNOSIS — Z79899 Other long term (current) drug therapy: Secondary | ICD-10-CM

## 2019-05-29 DIAGNOSIS — Z8249 Family history of ischemic heart disease and other diseases of the circulatory system: Secondary | ICD-10-CM | POA: Diagnosis not present

## 2019-05-29 DIAGNOSIS — I480 Paroxysmal atrial fibrillation: Secondary | ICD-10-CM | POA: Diagnosis present

## 2019-05-29 DIAGNOSIS — I10 Essential (primary) hypertension: Secondary | ICD-10-CM | POA: Diagnosis not present

## 2019-05-29 DIAGNOSIS — I5181 Takotsubo syndrome: Secondary | ICD-10-CM | POA: Diagnosis present

## 2019-05-29 DIAGNOSIS — I5032 Chronic diastolic (congestive) heart failure: Secondary | ICD-10-CM | POA: Diagnosis present

## 2019-05-29 DIAGNOSIS — Z20828 Contact with and (suspected) exposure to other viral communicable diseases: Secondary | ICD-10-CM | POA: Diagnosis present

## 2019-05-29 DIAGNOSIS — I110 Hypertensive heart disease with heart failure: Secondary | ICD-10-CM | POA: Diagnosis present

## 2019-05-29 DIAGNOSIS — I495 Sick sinus syndrome: Secondary | ICD-10-CM | POA: Diagnosis present

## 2019-05-29 DIAGNOSIS — F17210 Nicotine dependence, cigarettes, uncomplicated: Secondary | ICD-10-CM | POA: Diagnosis present

## 2019-05-29 DIAGNOSIS — I2510 Atherosclerotic heart disease of native coronary artery without angina pectoris: Secondary | ICD-10-CM | POA: Diagnosis present

## 2019-05-29 DIAGNOSIS — E785 Hyperlipidemia, unspecified: Secondary | ICD-10-CM | POA: Diagnosis present

## 2019-05-29 DIAGNOSIS — J449 Chronic obstructive pulmonary disease, unspecified: Secondary | ICD-10-CM | POA: Diagnosis not present

## 2019-05-29 DIAGNOSIS — J440 Chronic obstructive pulmonary disease with acute lower respiratory infection: Secondary | ICD-10-CM | POA: Diagnosis present

## 2019-05-29 DIAGNOSIS — R7989 Other specified abnormal findings of blood chemistry: Secondary | ICD-10-CM | POA: Diagnosis not present

## 2019-05-29 DIAGNOSIS — J189 Pneumonia, unspecified organism: Secondary | ICD-10-CM | POA: Diagnosis present

## 2019-05-29 DIAGNOSIS — Z8673 Personal history of transient ischemic attack (TIA), and cerebral infarction without residual deficits: Secondary | ICD-10-CM | POA: Diagnosis not present

## 2019-05-29 DIAGNOSIS — R0602 Shortness of breath: Secondary | ICD-10-CM

## 2019-05-29 DIAGNOSIS — R509 Fever, unspecified: Secondary | ICD-10-CM | POA: Diagnosis not present

## 2019-05-29 DIAGNOSIS — I340 Nonrheumatic mitral (valve) insufficiency: Secondary | ICD-10-CM | POA: Diagnosis not present

## 2019-05-29 DIAGNOSIS — I160 Hypertensive urgency: Secondary | ICD-10-CM | POA: Diagnosis present

## 2019-05-29 DIAGNOSIS — R001 Bradycardia, unspecified: Secondary | ICD-10-CM | POA: Diagnosis not present

## 2019-05-29 DIAGNOSIS — I361 Nonrheumatic tricuspid (valve) insufficiency: Secondary | ICD-10-CM | POA: Diagnosis present

## 2019-05-29 DIAGNOSIS — Z7901 Long term (current) use of anticoagulants: Secondary | ICD-10-CM | POA: Diagnosis not present

## 2019-05-29 DIAGNOSIS — R778 Other specified abnormalities of plasma proteins: Secondary | ICD-10-CM | POA: Diagnosis not present

## 2019-05-29 DIAGNOSIS — Z955 Presence of coronary angioplasty implant and graft: Secondary | ICD-10-CM

## 2019-05-29 DIAGNOSIS — R110 Nausea: Secondary | ICD-10-CM | POA: Diagnosis not present

## 2019-05-29 DIAGNOSIS — Z23 Encounter for immunization: Secondary | ICD-10-CM

## 2019-05-29 DIAGNOSIS — I214 Non-ST elevation (NSTEMI) myocardial infarction: Secondary | ICD-10-CM | POA: Diagnosis not present

## 2019-05-29 DIAGNOSIS — R0600 Dyspnea, unspecified: Secondary | ICD-10-CM

## 2019-05-29 LAB — CBC WITH DIFFERENTIAL/PLATELET
Abs Immature Granulocytes: 0.02 10*3/uL (ref 0.00–0.07)
Basophils Absolute: 0 10*3/uL (ref 0.0–0.1)
Basophils Relative: 0 %
Eosinophils Absolute: 0 10*3/uL (ref 0.0–0.5)
Eosinophils Relative: 0 %
HCT: 45.6 % (ref 36.0–46.0)
Hemoglobin: 14.3 g/dL (ref 12.0–15.0)
Immature Granulocytes: 0 %
Lymphocytes Relative: 9 %
Lymphs Abs: 0.9 10*3/uL (ref 0.7–4.0)
MCH: 30.4 pg (ref 26.0–34.0)
MCHC: 31.4 g/dL (ref 30.0–36.0)
MCV: 96.8 fL (ref 80.0–100.0)
Monocytes Absolute: 0.5 10*3/uL (ref 0.1–1.0)
Monocytes Relative: 5 %
Neutro Abs: 8.1 10*3/uL — ABNORMAL HIGH (ref 1.7–7.7)
Neutrophils Relative %: 86 %
Platelets: 247 10*3/uL (ref 150–400)
RBC: 4.71 MIL/uL (ref 3.87–5.11)
RDW: 15.9 % — ABNORMAL HIGH (ref 11.5–15.5)
WBC: 9.5 10*3/uL (ref 4.0–10.5)
nRBC: 0 % (ref 0.0–0.2)

## 2019-05-29 LAB — LACTIC ACID, PLASMA: Lactic Acid, Venous: 1.9 mmol/L (ref 0.5–1.9)

## 2019-05-29 MED ORDER — ACETAMINOPHEN 500 MG PO TABS
1000.0000 mg | ORAL_TABLET | Freq: Once | ORAL | Status: AC
  Administered 2019-05-29: 1000 mg via ORAL
  Filled 2019-05-29: qty 2

## 2019-05-29 NOTE — ED Provider Notes (Signed)
[facility] EMERGENCY DEPARTMENT Provider Note   CSN: [id number] Arrival date & time: 05/29/19  2236     History   Chief Complaint Chief Complaint  Patient presents with  . Shortness of Breath    HPI Sherry [name] is a 85 y.o. female.     The history is provided by the patient and medical records.  Shortness of Breath Associated symptoms: cough      85 year old female with history of A. fib on Eliquis, coronary artery disease, CHF with EF of 60-65%, hyperlipidemia, hypertension, prior stroke, presenting to the ED with shortness of breath.  Patient reports she has been feeling this way for a few days now.  She does report cough but denies any production of mucus.  She has not had any chest pain.  EMS called today due to worsening symptoms and she was noted to have a fever of 101.59F.  Patient was unaware that she has been running a fever.  She has been compliant with all of her medications.  She continues to smoke but only about 3 cigarettes a day.  She denies any known sick contacts or COVID exposures.  States she is mostly been staying at home, however her daughter and her 2 grandsons do come visit from time to time.  They have not been sick.  She does not use home O2.  After being placed on stretcher here in ED, sats dropped into the 80's on RA.  2L applied and sats improved to around 97%.  Past Medical History:  Diagnosis Date  . Atrial fibrillation ([facility])   . CAD (coronary artery disease)   . Chronic diastolic CHF (congestive heart failure) ([place])   . COPD (chronic obstructive pulmonary disease) ([place])   . Hypercholesteremia   . Hypertension   . Ischemic stroke ([place])   . Moderate tricuspid regurgitation     Patient Active Problem List   Diagnosis Date Noted  . Acute CVA (cerebrovascular accident) ([place]) 03/05/2019  . CAP (community acquired pneumonia) 03/05/2019  . Hypoglycemia 03/05/2019  . Fever 10/11/2017  . Hypokalemia 10/11/2017  . Tachycardia  10/11/2017  . Atrial fibrillation with rapid ventricular response ([place]) 10/11/2017    Past Surgical History:  Procedure Laterality Date  . CORONARY ANGIOPLASTY WITH STENT PLACEMENT    . HIP SURGERY       OB History   No obstetric history on file.      Home Medications    Prior to Admission medications   Medication Sig Start Date End Date Taking? Authorizing Provider  acetaminophen (TYLENOL) 500 MG tablet Take 2 tablets (1,000 mg total) by mouth every 8 (eight) hours as needed for mild pain, fever or headache. Patient taking differently: Take 1,000 mg by mouth every 4 (four) hours as needed (for pain).  10/13/17   Geradine Girt, DO  apixaban (ELIQUIS) 2.5 MG TABS tablet Take 1 tablet (2.5 mg total) by mouth 2 (two) times daily. 03/15/19   Samuella Cota, MD  atorvastatin (LIPITOR) 40 MG tablet Take 40 mg by mouth daily.  12/22/16   [provider]  diltiazem (CARDIZEM CD) 240 MG 24 hr capsule Take 1 capsule (240 mg total) by mouth daily. 03/16/19   Samuella Cota, MD  ferrous sulfate 325 (65 FE) MG tablet Take 1 tablet (325 mg total) by mouth daily with breakfast. 03/16/19   Samuella Cota, MD  hydrOXYzine (ATARAX/VISTARIL) 50 MG tablet Take 50 mg by mouth daily.  01/20/19   [provider]  ipratropium-albuterol (DUONEB) 0.5-2.5 (3) MG/3ML SOLN Take 3 mLs by nebulization every 4 (four) hours as needed (sob/wheezing). Patient not taking: Reported on 03/05/2019 10/13/17   Joseph Art, DO  telmisartan (MICARDIS) 40 MG tablet Take 40 mg by mouth daily. 12/31/18   [provider]    Family History Family History  Problem Relation Age of Onset  . Heart attack Mother     Social History Social History   Tobacco Use  . Smoking status: Current Every Day Smoker    Packs/day: 1.00    Types: Cigarettes  . Smokeless tobacco: Never Used  Substance Use Topics  . Alcohol use: No    Frequency: Never  . Drug use: No     Allergies   Patient has no  known allergies.   Review of Systems Review of Systems  Respiratory: Positive for cough and shortness of breath.   All other systems reviewed and are negative.    Physical Exam Updated Vital Signs Ht  (1.626 m)   Wt 52.2 kg   BMI 19.74 kg/m   Physical Exam Vitals signs and nursing note reviewed.  Constitutional:      Appearance: She is well-developed.  HENT:     Head: Normocephalic and atraumatic.  Eyes:     Conjunctiva/sclera: Conjunctivae normal.     Pupils: Pupils are equal, round, and reactive to light.  Neck:     Musculoskeletal: Normal range of motion.  Cardiovascular:     Rate and Rhythm: Normal rate and regular rhythm.     Heart sounds: Normal heart sounds.  Pulmonary:     Effort: Pulmonary effort is normal.     Breath sounds: Normal breath sounds. No wheezing or rhonchi.  Abdominal:     General: Bowel sounds are normal.     Palpations: Abdomen is soft.  Musculoskeletal: Normal range of motion.  Skin:    General: Skin is warm and dry.  Neurological:     Mental Status: She is alert and oriented to person, place, and time.      ED Treatments / Results  Labs (all labs ordered are listed, but only abnormal results are displayed) Labs Reviewed  CBC WITH DIFFERENTIAL/PLATELET - Abnormal; Notable for the following components:      Result Value   RDW 15.9 (*)    Neutro Abs 8.1 (*)    All other components within normal limits  COMPREHENSIVE METABOLIC PANEL - Abnormal; Notable for the following components:   Glucose, Bld 127 (*)    Creatinine, Ser 1.01 (*)    GFR calc non Af Amer 50 (*)    GFR calc Af Amer 58 (*)    All other components within normal limits  TROPONIN I (HIGH SENSITIVITY) - Abnormal; Notable for the following components:   Troponin I (High Sensitivity) 763 (*)    All other components within normal limits  SARS CORONAVIRUS 2 (HOSPITAL ORDER, PERFORMED IN [facility])  CULTURE, BLOOD (ROUTINE X 2)  CULTURE, BLOOD (ROUTINE  X 2)  URINE CULTURE  LACTIC ACID, PLASMA  URINALYSIS, ROUTINE W REFLEX MICROSCOPIC  APTT  HEPARIN LEVEL (UNFRACTIONATED)  PROCALCITONIN  STREP PNEUMONIAE URINARY ANTIGEN  TROPONIN I (HIGH SENSITIVITY)  TROPONIN I (HIGH SENSITIVITY)    EKG None  Radiology Dg Chest Port 1 View  Result Date: 05/29/2019 CLINICAL DATA:  Shortness of breath. Fever. EXAM: PORTABLE CHEST 1 VIEW COMPARISON:  Radiograph 03/14/2019, CT 10/12/2017 FINDINGS: Chronic cardiomegaly. Aortic atherosclerosis and tortuosity. Pulmonary edema,  less than on prior exam. Superimposed patchy bibasilar opacities. Chronic hyperinflation. No large pleural effusion. No pneumothorax. The bones are under mineralized with scoliotic curvature of the spine. IMPRESSION: 1. Chronic cardiomegaly. Pulmonary edema, less than on prior exam. Patchy bibasilar opacities may be atelectasis or pneumonia. 2. Chronic hyperinflation and bronchial thickening consistent with COPD. Electronically Signed   By: Narda RutherfordMelanie  Sanford M.D.   On: 05/29/2019 23:29    Procedures Procedures (including critical care time)  CRITICAL CARE Performed by: Garlon HatchetLisa M Jordynne Mccown   Total critical care time: 40 minutes  Critical care time was exclusive of separately billable procedures and treating other patients.  Critical care was necessary to treat or prevent imminent or life-threatening deterioration.  Critical care was time spent personally by me on the following activities: development of treatment plan with patient and/or surrogate as well as nursing, discussions with consultants, evaluation of patient's response to treatment, examination of patient, obtaining history from patient or surrogate, ordering and performing treatments and interventions, ordering and review of laboratory studies, ordering and review of radiographic studies, pulse oximetry and re-evaluation of patient's condition.   Medications Ordered in ED Medications  vancomycin (VANCOCIN) IVPB 1000 mg/200  mL premix (has no administration in time range)  ceFEPIme (MAXIPIME) 2 g in sodium chloride 0.9 % 100 mL IVPB (2 g Intravenous New Bag/Given 05/30/19 0225)  atorvastatin (LIPITOR) tablet 40 mg (has no administration in time range)  nitroGLYCERIN (NITROSTAT) SL tablet 0.4 mg (has no administration in time range)  azithromycin (ZITHROMAX) 500 mg in sodium chloride 0.9 % 250 mL IVPB (has no administration in time range)  heparin ADULT infusion 100 units/mL (25000 units/2350mL sodium chloride 0.45%) (600 Units/hr Intravenous New Bag/Given 05/30/19 0229)  acetaminophen (TYLENOL) tablet 1,000 mg (1,000 mg Oral Given 05/29/19 2315)  aspirin chewable tablet 324 mg (324 mg Oral Given 05/30/19 0220)     Initial Impression / Assessment and Plan / ED Course  I have reviewed the triage vital signs and the nursing notes.  Pertinent labs & imaging results that were available during my care of the patient were reviewed by me and considered in my medical decision making (see chart for details).  85 y.o. F here with SOB for the past few days.  Did have some nausea earlier but no vomiting.  This resolved with zofran per EMS.  Patient febrile here but overall non-toxic.  Breath sounds coarse and shallow.  O2 sats did drop into the 80's so placed on 2L with improvement into the 90's.  Mildly tachy, suspect this is due to fever.  Plan for infectious/sepsis work-up including labs, blood and urine cultures, CXR, COVID screen.  Given tylenol for fever.  She has history of CHF so will hold off on fluids at this time.  Labs overall reassuring-- normal WBC count, normal lactate.  CXR with chronic cardiomegaly, mild edema, bibasilar opacities.  Given her cough and fever favor this to represent pneumonia.  COVID screen is negative.  Her troponin is elevated here at 763, no prior high sensitivity values for comparison.  Patient has not had any chest pain, single episode of nausea without recurrence.  Will start vanc/cefepime and plan  for admission.  Discussed with Dr. Antionette Charpyd-- requested cardiology consult and will admit for ongoing care.  Discussed with Cardiology fellow-- will consult and provide recommendations.  Final Clinical Impressions(s) / ED Diagnoses   Final diagnoses:  Pneumonia of both lower lobes due to infectious organism [facility](HCC)  Shortness of breath  Elevated troponin  ED Discharge Orders    None       Garlon Hatchet, PA-C 05/30/19 0252    Marily Memos, MD 05/30/19 [phone]

## 2019-05-29 NOTE — ED Triage Notes (Signed)
EMS reports pt called out for shortness of breath and some nausea, 120hr, 150cbg, 95ra, 176/88, 101.7*f. hx afib, hxt stroke, copd. Still smokes.

## 2019-05-30 ENCOUNTER — Encounter (HOSPITAL_COMMUNITY): Payer: Self-pay | Admitting: Family Medicine

## 2019-05-30 ENCOUNTER — Inpatient Hospital Stay (HOSPITAL_COMMUNITY): Payer: Medicare HMO

## 2019-05-30 DIAGNOSIS — I480 Paroxysmal atrial fibrillation: Secondary | ICD-10-CM | POA: Diagnosis present

## 2019-05-30 DIAGNOSIS — N289 Disorder of kidney and ureter, unspecified: Secondary | ICD-10-CM

## 2019-05-30 DIAGNOSIS — Z79899 Other long term (current) drug therapy: Secondary | ICD-10-CM | POA: Diagnosis not present

## 2019-05-30 DIAGNOSIS — I5032 Chronic diastolic (congestive) heart failure: Secondary | ICD-10-CM | POA: Diagnosis present

## 2019-05-30 DIAGNOSIS — I214 Non-ST elevation (NSTEMI) myocardial infarction: Secondary | ICD-10-CM

## 2019-05-30 DIAGNOSIS — F17210 Nicotine dependence, cigarettes, uncomplicated: Secondary | ICD-10-CM | POA: Diagnosis present

## 2019-05-30 DIAGNOSIS — I160 Hypertensive urgency: Secondary | ICD-10-CM | POA: Diagnosis present

## 2019-05-30 DIAGNOSIS — J189 Pneumonia, unspecified organism: Secondary | ICD-10-CM | POA: Diagnosis present

## 2019-05-30 DIAGNOSIS — Z8673 Personal history of transient ischemic attack (TIA), and cerebral infarction without residual deficits: Secondary | ICD-10-CM

## 2019-05-30 DIAGNOSIS — I340 Nonrheumatic mitral (valve) insufficiency: Secondary | ICD-10-CM | POA: Diagnosis not present

## 2019-05-30 DIAGNOSIS — I5181 Takotsubo syndrome: Secondary | ICD-10-CM | POA: Diagnosis present

## 2019-05-30 DIAGNOSIS — R778 Other specified abnormalities of plasma proteins: Secondary | ICD-10-CM | POA: Diagnosis not present

## 2019-05-30 DIAGNOSIS — J449 Chronic obstructive pulmonary disease, unspecified: Secondary | ICD-10-CM | POA: Diagnosis not present

## 2019-05-30 DIAGNOSIS — I361 Nonrheumatic tricuspid (valve) insufficiency: Secondary | ICD-10-CM

## 2019-05-30 DIAGNOSIS — R7989 Other specified abnormal findings of blood chemistry: Secondary | ICD-10-CM

## 2019-05-30 DIAGNOSIS — R001 Bradycardia, unspecified: Secondary | ICD-10-CM | POA: Diagnosis not present

## 2019-05-30 DIAGNOSIS — Z955 Presence of coronary angioplasty implant and graft: Secondary | ICD-10-CM | POA: Diagnosis not present

## 2019-05-30 DIAGNOSIS — I110 Hypertensive heart disease with heart failure: Secondary | ICD-10-CM | POA: Diagnosis present

## 2019-05-30 DIAGNOSIS — Z7901 Long term (current) use of anticoagulants: Secondary | ICD-10-CM | POA: Diagnosis not present

## 2019-05-30 DIAGNOSIS — Z20828 Contact with and (suspected) exposure to other viral communicable diseases: Secondary | ICD-10-CM | POA: Diagnosis present

## 2019-05-30 DIAGNOSIS — Z8249 Family history of ischemic heart disease and other diseases of the circulatory system: Secondary | ICD-10-CM | POA: Diagnosis not present

## 2019-05-30 DIAGNOSIS — E785 Hyperlipidemia, unspecified: Secondary | ICD-10-CM | POA: Diagnosis present

## 2019-05-30 DIAGNOSIS — I495 Sick sinus syndrome: Secondary | ICD-10-CM | POA: Diagnosis present

## 2019-05-30 DIAGNOSIS — J440 Chronic obstructive pulmonary disease with acute lower respiratory infection: Secondary | ICD-10-CM | POA: Diagnosis present

## 2019-05-30 DIAGNOSIS — R0602 Shortness of breath: Secondary | ICD-10-CM | POA: Diagnosis present

## 2019-05-30 DIAGNOSIS — I2510 Atherosclerotic heart disease of native coronary artery without angina pectoris: Secondary | ICD-10-CM | POA: Diagnosis present

## 2019-05-30 DIAGNOSIS — Z23 Encounter for immunization: Secondary | ICD-10-CM | POA: Diagnosis not present

## 2019-05-30 LAB — COMPREHENSIVE METABOLIC PANEL
ALT: 11 U/L (ref 0–44)
AST: 21 U/L (ref 15–41)
Albumin: 4 g/dL (ref 3.5–5.0)
Alkaline Phosphatase: 82 U/L (ref 38–126)
Anion gap: 11 (ref 5–15)
BUN: 17 mg/dL (ref 8–23)
CO2: 24 mmol/L (ref 22–32)
Calcium: 9.3 mg/dL (ref 8.9–10.3)
Chloride: 104 mmol/L (ref 98–111)
Creatinine, Ser: 1.01 mg/dL — ABNORMAL HIGH (ref 0.44–1.00)
GFR calc Af Amer: 58 mL/min — ABNORMAL LOW (ref >60)
GFR calc non Af Amer: 50 mL/min — ABNORMAL LOW (ref >60)
Glucose, Bld: 127 mg/dL — ABNORMAL HIGH (ref 70–99)
Potassium: 3.7 mmol/L (ref 3.5–5.1)
Sodium: 139 mmol/L (ref 135–145)
Total Bilirubin: 0.9 mg/dL (ref 0.3–1.2)
Total Protein: 7.3 g/dL (ref 6.5–8.1)

## 2019-05-30 LAB — BASIC METABOLIC PANEL
Anion gap: 10 (ref 5–15)
BUN: 21 mg/dL (ref 8–23)
CO2: 22 mmol/L (ref 22–32)
Calcium: 8.7 mg/dL — ABNORMAL LOW (ref 8.9–10.3)
Chloride: 108 mmol/L (ref 98–111)
Creatinine, Ser: 0.98 mg/dL (ref 0.44–1.00)
GFR calc Af Amer: 60 mL/min — ABNORMAL LOW (ref >60)
GFR calc non Af Amer: 52 mL/min — ABNORMAL LOW (ref >60)
Glucose, Bld: 83 mg/dL (ref 70–99)
Potassium: 3.5 mmol/L (ref 3.5–5.1)
Sodium: 140 mmol/L (ref 135–145)

## 2019-05-30 LAB — TROPONIN I (HIGH SENSITIVITY)
Troponin I (High Sensitivity): 763 ng/L (ref <18)
Troponin I (High Sensitivity): 476 ng/L (ref <18)
Troponin I (High Sensitivity): 290 ng/L (ref <18)

## 2019-05-30 LAB — URINALYSIS, ROUTINE W REFLEX MICROSCOPIC
Bilirubin Urine: NEGATIVE
Glucose, UA: NEGATIVE mg/dL
Hgb urine dipstick: NEGATIVE
Ketones, ur: 5 mg/dL — AB
Leukocytes,Ua: NEGATIVE
Nitrite: NEGATIVE
Protein, ur: 100 mg/dL — AB
Specific Gravity, Urine: 1.034 — ABNORMAL HIGH (ref 1.005–1.030)
pH: 5 (ref 5.0–8.0)

## 2019-05-30 LAB — ECHOCARDIOGRAM LIMITED
Height: 64 in
Weight: 1840 oz

## 2019-05-30 LAB — HEPARIN LEVEL (UNFRACTIONATED): Heparin Unfractionated: 0.38 IU/mL (ref 0.30–0.70)

## 2019-05-30 LAB — STREP PNEUMONIAE URINARY ANTIGEN: Strep Pneumo Urinary Antigen: NEGATIVE

## 2019-05-30 LAB — APTT: aPTT: 62 seconds — ABNORMAL HIGH (ref 24–36)

## 2019-05-30 LAB — SARS CORONAVIRUS 2 BY RT PCR (HOSPITAL ORDER, PERFORMED IN ~~LOC~~ HOSPITAL LAB): SARS Coronavirus 2: NEGATIVE

## 2019-05-30 LAB — PROCALCITONIN: Procalcitonin: <0.1 ng/mL

## 2019-05-30 MED ORDER — ASPIRIN 81 MG PO CHEW
324.0000 mg | CHEWABLE_TABLET | Freq: Once | ORAL | Status: AC
  Administered 2019-05-30: 02:00:00 324 mg via ORAL
  Filled 2019-05-30: qty 4

## 2019-05-30 MED ORDER — INFLUENZA VAC A&B SA ADJ QUAD 0.5 ML IM PRSY
0.5000 mL | PREFILLED_SYRINGE | INTRAMUSCULAR | Status: AC
  Administered 2019-06-02: 0.5 mL via INTRAMUSCULAR
  Filled 2019-05-30 (×2): qty 0.5

## 2019-05-30 MED ORDER — ATORVASTATIN CALCIUM 40 MG PO TABS
40.0000 mg | ORAL_TABLET | Freq: Every day | ORAL | Status: DC
  Administered 2019-05-30 – 2019-06-01 (×3): 40 mg via ORAL
  Filled 2019-05-30 (×3): qty 1

## 2019-05-30 MED ORDER — DILTIAZEM HCL ER COATED BEADS 240 MG PO CP24
240.0000 mg | ORAL_CAPSULE | Freq: Every day | ORAL | Status: DC
  Administered 2019-05-30: 13:00:00 240 mg via ORAL
  Filled 2019-05-30: qty 1

## 2019-05-30 MED ORDER — ONDANSETRON HCL 4 MG/2ML IJ SOLN
4.0000 mg | Freq: Four times a day (QID) | INTRAMUSCULAR | Status: DC | PRN
  Administered 2019-06-01: 15:00:00 4 mg via INTRAVENOUS
  Filled 2019-05-30: qty 2

## 2019-05-30 MED ORDER — AMIODARONE HCL IN DEXTROSE 360-4.14 MG/200ML-% IV SOLN
60.0000 mg/h | INTRAVENOUS | Status: AC
  Administered 2019-05-30 (×2): 60 mg/h via INTRAVENOUS
  Filled 2019-05-30: qty 200

## 2019-05-30 MED ORDER — ACETAMINOPHEN 325 MG PO TABS
650.0000 mg | ORAL_TABLET | ORAL | Status: DC | PRN
  Administered 2019-06-01 – 2019-06-02 (×2): 650 mg via ORAL
  Filled 2019-05-30 (×2): qty 2

## 2019-05-30 MED ORDER — SODIUM CHLORIDE 0.9 % IV SOLN
2.0000 g | Freq: Once | INTRAVENOUS | Status: AC
  Administered 2019-05-30: 2 g via INTRAVENOUS
  Filled 2019-05-30: qty 2

## 2019-05-30 MED ORDER — AMIODARONE LOAD VIA INFUSION
150.0000 mg | Freq: Once | INTRAVENOUS | Status: AC
  Administered 2019-05-30: 15:00:00 150 mg via INTRAVENOUS
  Filled 2019-05-30: qty 83.34

## 2019-05-30 MED ORDER — AMIODARONE HCL IN DEXTROSE 360-4.14 MG/200ML-% IV SOLN
30.0000 mg/h | INTRAVENOUS | Status: DC
  Filled 2019-05-30 (×2): qty 200

## 2019-05-30 MED ORDER — NITROGLYCERIN 0.4 MG SL SUBL: 0.4000 mg | SUBLINGUAL_TABLET | SUBLINGUAL | Status: DC | PRN

## 2019-05-30 MED ORDER — SODIUM CHLORIDE 0.9 % IV SOLN
500.0000 mg | INTRAVENOUS | Status: DC
  Administered 2019-05-30: 03:00:00 500 mg via INTRAVENOUS
  Filled 2019-05-30: qty 500

## 2019-05-30 MED ORDER — METOPROLOL TARTRATE 25 MG PO TABS: 25.0000 mg | ORAL_TABLET | Freq: Two times a day (BID) | ORAL | Status: DC

## 2019-05-30 MED ORDER — ASPIRIN EC 81 MG PO TBEC
81.0000 mg | DELAYED_RELEASE_TABLET | Freq: Every day | ORAL | Status: DC
  Administered 2019-05-31 – 2019-06-01 (×2): 81 mg via ORAL
  Filled 2019-05-30 (×2): qty 1

## 2019-05-30 MED ORDER — SODIUM CHLORIDE 0.9 % IV SOLN
2.0000 g | INTRAVENOUS | Status: DC
  Filled 2019-05-30: qty 20

## 2019-05-30 MED ORDER — VANCOMYCIN HCL IN DEXTROSE 1-5 GM/200ML-% IV SOLN
1000.0000 mg | Freq: Once | INTRAVENOUS | Status: AC
  Administered 2019-05-30: 1000 mg via INTRAVENOUS
  Filled 2019-05-30: qty 200

## 2019-05-30 MED ORDER — METOPROLOL TARTRATE 5 MG/5ML IV SOLN: 5.0000 mg | Freq: Once | INTRAVENOUS | Status: DC

## 2019-05-30 MED ORDER — HEPARIN (PORCINE) 25000 UT/250ML-% IV SOLN
600.0000 [IU]/h | INTRAVENOUS | Status: DC
  Administered 2019-05-30: 02:00:00 600 [IU]/h via INTRAVENOUS
  Filled 2019-05-30: qty 250

## 2019-05-30 MED ORDER — IPRATROPIUM-ALBUTEROL 0.5-2.5 (3) MG/3ML IN SOLN: 3.0000 mL | RESPIRATORY_TRACT | Status: DC | PRN

## 2019-05-30 MED ORDER — IRBESARTAN 150 MG PO TABS
150.0000 mg | ORAL_TABLET | Freq: Every day | ORAL | Status: DC
  Administered 2019-05-30 – 2019-06-02 (×4): 150 mg via ORAL
  Filled 2019-05-30 (×4): qty 1

## 2019-05-30 NOTE — Progress Notes (Signed)
Pt admitted for dyspnea and chest pain. See H&P for full details. Cards noted/rec's appreciated. Echo ordered. Conservative management. Continuing IV heparin for now. Trp trending down. CXR w/o definitive PNA. Procal is negative. Afebrile and respirations are improved. Reasonable to d/c abx. Add IS. Continue duonebs (Hx of COPD). Otherwise, continue as per H&P.   General: 85 y.o. female resting in bed in NAD Eyes: PERRL, normal sclera ENMT: Nares patent w/o discharge, orophaynx clear, dentition normal, ears w/o discharge/lesions/ulcers Cardiovascular: irregular, +S1, S2, no m/g/r, equal pulses throughout Respiratory: CTABL, no w/r/r , normal WOB GI: BS+, NDNT, no masses noted, no organomegaly noted MSK: No e/c/c Skin: No rashes, bruises, ulcerations noted Neuro: A&O x 3, no focal deficits Psyc: Appropriate interaction and affect, calm/cooperative  .Jonnie Finner, DO

## 2019-05-30 NOTE — Progress Notes (Signed)
  Echocardiogram 2D Echocardiogram has been performed.  Sherry [name] 05/30/2019, 10:05 AM

## 2019-05-30 NOTE — Consult Note (Signed)
Cardiology Consultation:   Patient ID: RHONDA [name]; [id number]; 21-Dec-1928   Admit date: 05/29/2019 Date of Consult: 05/30/2019  Primary Care Provider: Darrow Bussing, MD Primary Cardiologist: No primary care provider on file. Primary Electrophysiologist:  None  Chief Complaint: nausea  Patient Profile:   Sherry [name] is a 85 y.o. female  with medical history significant for AF, CAD, HFpEF, COPD, tobacco use, CVA who presents with nausea, shortness of breath, and general malaise.  History of Present Illness:    Patient reports increased dyspnea and increase in her chronic cough, but has not produced any sputum.  She has not noted any subjective fevers or chills at home.  She has not experienced any chest pain recently and denies any lower extremity swelling or tenderness.  With worsening dyspnea and malaise, EMS was called and found the patient to be febrile to 101.7 F and tachycardic to 120.  Upon arrival to the ED, patient is found to be afebrile, saturating mid 90s on room air, mildly tachypneic, tachycardic to 110, and with blood pressure 140/107.  EKG future sinus tachycardia with rate on 920, PVCs, and diffuse ST-T abnormalities.  Chest x-ray features chronic cardiomegaly, pulmonary edema that has decreased since the prior study, chronic COPD changes, and patchy bibasilar opacity that could reflect atelectasis or pneumonia.  Chemistry panel was notable for creatinine 1.01, similar to priors.  CBC is unremarkable.  Lactic acid is normal.  COVID-19 is negative.  High-sensitivity troponin is elevated to 763.  Blood cultures were collected and patient was treated with acetaminophen, vancomycin, and cefepime in the ED.  Past Medical History:  Diagnosis Date  . Atrial fibrillation ([facility])   . CAD (coronary artery disease)   . Chronic diastolic CHF (congestive heart failure) ([facility])   . COPD (chronic obstructive pulmonary disease) ([facility])   . Hypercholesteremia   . Hypertension   .  Ischemic stroke ([facility])   . Moderate tricuspid regurgitation     Past Surgical History:  Procedure Laterality Date  . CORONARY ANGIOPLASTY WITH STENT PLACEMENT    . HIP SURGERY       Inpatient Medications: Scheduled Meds: . aspirin  324 mg Oral Once  . atorvastatin  40 mg Oral q1800  . metoprolol tartrate  5 mg Intravenous Once   Continuous Infusions: . azithromycin    . ceFEPime (MAXIPIME) IV    . heparin    . vancomycin     PRN Meds: nitroGLYCERIN  Home Meds: Prior to Admission medications   Medication Sig Start Date End Date Taking? Authorizing Provider  acetaminophen (TYLENOL) 500 MG tablet Take 2 tablets (1,000 mg total) by mouth every 8 (eight) hours as needed for mild pain, fever or headache. Patient taking differently: Take 1,000 mg by mouth every 4 (four) hours as needed (for pain).  10/13/17   Joseph Art, DO  apixaban (ELIQUIS) 2.5 MG TABS tablet Take 1 tablet (2.5 mg total) by mouth 2 (two) times daily. 03/15/19   Standley Brooking, MD  atorvastatin (LIPITOR) 40 MG tablet Take 40 mg by mouth daily.  12/22/16   [provider]  diltiazem (CARDIZEM CD) 240 MG 24 hr capsule Take 1 capsule (240 mg total) by mouth daily. 03/16/19   Standley Brooking, MD  ferrous sulfate 325 (65 FE) MG tablet Take 1 tablet (325 mg total) by mouth daily with breakfast. 03/16/19   Standley Brooking, MD  hydrOXYzine (ATARAX/VISTARIL) 50 MG tablet Take 50 mg by mouth daily.  01/20/19  [provider]  ipratropium-albuterol (DUONEB) 0.5-2.5 (3) MG/3ML SOLN Take 3 mLs by nebulization every 4 (four) hours as needed (sob/wheezing). Patient not taking: Reported on 03/05/2019 10/13/17   Joseph ArtVann, Jessica U, DO  telmisartan (MICARDIS) 40 MG tablet Take 40 mg by mouth daily. 12/31/18   [provider]    Allergies:   No Known Allergies  Social History:   Social History   Socioeconomic History  . Marital status: Widowed    Spouse name: Not on file  . Number of children: Not  on file  . Years of education: Not on file  . Highest education level: Not on file  Occupational History  . Not on file  Social Needs  . Financial resource strain: Not on file  . Food insecurity    Worry: Not on file    Inability: Not on file  . Transportation needs    Medical: Not on file    Non-medical: Not on file  Tobacco Use  . Smoking status: Current Every Day Smoker    Packs/day: 1.00    Types: Cigarettes  . Smokeless tobacco: Never Used  Substance and Sexual Activity  . Alcohol use: No    Frequency: Never  . Drug use: No  . Sexual activity: Not on file  Lifestyle  . Physical activity    Days per week: Not on file    Minutes per session: Not on file  . Stress: Not on file  Relationships  . Social Musicianconnections    Talks on phone: Not on file    Gets together: Not on file    Attends religious service: Not on file    Active member of club or organization: Not on file    Attends meetings of clubs or organizations: Not on file    Relationship status: Not on file  . Intimate partner violence    Fear of current or ex partner: Not on file    Emotionally abused: Not on file    Physically abused: Not on file    Forced sexual activity: Not on file  Other Topics Concern  . Not on file  Social History Narrative  . Not on file     Family History:    Family History  Problem Relation Age of Onset  . Heart attack Mother       ROS:  Please see the history of present illness.   All other ROS reviewed and negative.     Physical Exam/Data:   Vitals:   05/29/19 2240 05/29/19 2258  BP:  (!) 139/107  Pulse:  (!) 112  Resp:  (!) 24  Temp:  99.4 F (37.4 C)  TempSrc:  Oral  SpO2:  96%  Weight: 52.2 kg   Height: 5\' 4"  (1.626 m)    No intake or output data in the 24 hours ending 05/30/19 0205 Last 3 Weights 05/29/2019 03/05/2019 10/13/2017  Weight (lbs) 115 lb 105 lb 110 lb 3.2 oz  Weight (kg) 52.164 kg 47.628 kg 49.986 kg     Body mass index is 19.74 kg/m.   General: Well developed, well nourished, in no acute distress. Head: Normocephalic, atraumatic, sclera non-icteric, no xanthomas, nares are without discharge.  Neck: Negative for carotid bruits. JVD not elevated. Lungs: Clear bilaterally to auscultation without wheezes, rales, or rhonchi. Breathing is unlabored. Heart: RRR with S1 S2. No murmurs, rubs, or gallops appreciated. Abdomen: Soft, non-tender, non-distended with normoactive bowel sounds. No hepatomegaly. No rebound/guarding. No obvious abdominal masses. Msk:  Strength  and tone appear normal for age. Extremities: No clubbing or cyanosis. No edema.  Distal pedal pulses are 2+ and equal bilaterally. Neuro: Alert and oriented X 3. No facial asymmetry. No focal deficit. Moves all extremities spontaneously. Psych:  Responds to questions appropriately with a normal affect.   EKG:  The EKG was personally reviewed and demonstrates:  TW inversions Telemetry:  Telemetry was personally reviewed and demonstrates:  sinus  Relevant CV Studies: Echo 03/2019 1. The left ventricle has normal systolic function with an ejection fraction of 60-65%. The cavity size was normal. There is mildly increased left ventricular wall thickness. Left ventricular diastolic function could not be evaluated. Elevated mean left  atrial pressure.  2. The right ventricle has normal systolic function. The cavity was normal. Right ventricular systolic pressure is mildly elevated.  3. Left atrial size was severely dilated.  4. Right atrial size was severely dilated.  5. The mitral valve is abnormal. Mild thickening of the mitral valve leaflet. There is severe mitral annular calcification present.  6. The tricuspid valve is grossly normal. Tricuspid valve regurgitation is moderate-severe.  7. The aortic valve is tricuspid. Mild thickening of the aortic valve. Aortic valve regurgitation is mild by color flow Doppler. No stenosis of the aortic valve.  8. The inferior vena cava  was dilated in size with <50% respiratory variability.  9. Mild apical hypokinesis with overall preserved LV function; mild LVH; severe biatrial enlargement; milld AI; moderate to severe TR; RVSP mildly elevated.  Laboratory Data:  High Sensitivity Troponin:   Recent Labs  Lab 05/29/19 2303  TROPONINIHS 763*     Cardiac EnzymesNo results for input(s): TROPONINI in the last 168 hours. No results for input(s): TROPIPOC in the last 168 hours.  Chemistry Recent Labs  Lab 05/29/19 2303  NA 139  K 3.7  CL 104  CO2 24  GLUCOSE 127*  BUN 17  CREATININE 1.01*  CALCIUM 9.3  GFRNONAA 50*  GFRAA 58*  ANIONGAP 11    Recent Labs  Lab 05/29/19 2303  PROT 7.3  ALBUMIN 4.0  AST 21  ALT 11  ALKPHOS 82  BILITOT 0.9   Hematology Recent Labs  Lab 05/29/19 2303  WBC 9.5  RBC 4.71  HGB 14.3  HCT 45.6  MCV 96.8  MCH 30.4  MCHC 31.4  RDW 15.9*  PLT 247   BNPNo results for input(s): BNP, PROBNP in the last 168 hours.  DDimer No results for input(s): DDIMER in the last 168 hours.   Radiology/Studies:  Dg Chest Port 1 View  Result Date: 05/29/2019 CLINICAL DATA:  Shortness of breath. Fever. EXAM: PORTABLE CHEST 1 VIEW COMPARISON:  Radiograph 03/14/2019, CT 10/12/2017 FINDINGS: Chronic cardiomegaly. Aortic atherosclerosis and tortuosity. Pulmonary edema, less than on prior exam. Superimposed patchy bibasilar opacities. Chronic hyperinflation. No large pleural effusion. No pneumothorax. The bones are under mineralized with scoliotic curvature of the spine. IMPRESSION: 1. Chronic cardiomegaly. Pulmonary edema, less than on prior exam. Patchy bibasilar opacities may be atelectasis or pneumonia. 2. Chronic hyperinflation and bronchial thickening consistent with COPD. Electronically Signed   By: Narda Rutherford M.D.   On: 05/29/2019 23:29    Assessment and Plan:   1. NSTEMI Would treat for NSTEMI given episode of nausea with ECG changes and significantly elevated troponin.  Start IV  heparin, hold home apixaban.  Would get echo.  Will need to determine invasive vs. noninvasive strategy, based on her age, clinical course, and echo results.  For questions or updates, please contact [place] Please consult [email or web address] for contact info under     Signed, Ceejay Kegley S, MD  05/30/2019 2:05 AM

## 2019-05-30 NOTE — ED Notes (Signed)
SDU daughterLelon Frohlich, [phone] would like an update

## 2019-05-30 NOTE — Progress Notes (Signed)
  Echocardiogram 2D Echocardiogram has been performed.  Sherry [name] 05/30/2019, 10:01 AM

## 2019-05-30 NOTE — Progress Notes (Signed)
    Called by  RN, patient into Afib RVR rates 130. Hx of PAF. Has been on Eliquis, now held on IV heparin. ECHO with hypokinesis in the apical walls, official read reading. Will start on IV amiodarone, hopefully will convert with medical therapy. Asymptomatic with rate.   SignedReino Bellis, NP-C 05/30/2019, 2:59 PM Pager: [phone]

## 2019-05-30 NOTE — Progress Notes (Signed)
[facility] for Heparin (Apixaban on hold) Indication: chest pain/ACS and atrial fibrillation  No Known Allergies  Patient Measurements: Height: 5\' 6"  (167.6 cm) Weight: 115 lb (52.2 kg) IBW/kg (Calculated) : 59.3  Vital Signs: Temp: 98.1 F (36.7 C) (09/28 0810) Temp Source: Oral (09/28 0810) BP: 144/89 (09/28 0810) Pulse Rate: 76 (09/28 0615)  Labs: Recent Labs    05/29/19 2303 05/30/19 0548 05/30/19 1113  HGB 14.3  --   --   HCT 45.6  --   --   PLT 247  --   --   APTT  --   --  62*  HEPARINUNFRC  --   --  0.38  CREATININE 1.01*  --  0.98  TROPONINIHS 763* 476* 290*    Estimated Creatinine Clearance: 32.7 mL/min (by C-G formula based on SCr of 0.98 mg/dL).   Medical History: Past Medical History:  Diagnosis Date  . Atrial fibrillation ([place])   . CAD (coronary artery disease)   . Chronic diastolic CHF (congestive heart failure) ([place])   . COPD (chronic obstructive pulmonary disease) ([place])   . Hypercholesteremia   . Hypertension   . Ischemic stroke ([place])   . Moderate tricuspid regurgitation     Assessment: 85 y/o F with shortness of breath and nausea. Pharmacy dosing heparin for possible ACS. She is on apixaban PTA for afib with last dose on 9/27. Plans for echo -heparin level at goal (with trend up, I  Think we can rely on heparin levels)   Goal of Therapy:  Heparin level 0.3-0.7 units/ml aPTT 66-102 seconds Monitor platelets by anticoagulation protocol: Yes   Plan:  -No heparin changes needed -Will d/c daily aPTT -Daily heparin level and CBC  Hildred Laser, PharmD Clinical Pharmacist **Pharmacist phone directory can now be found on [email or web address] ([email or web address]).  Listed under [place].

## 2019-05-30 NOTE — H&P (Signed)
History and Physical    Sherry [name] Sherry [name]:[id number]:[id number] DOB: [birth year] DOA: 05/29/2019  PCP: Darrow BussingKoirala, Dibas, MD   Patient coming from: Home with daughter   Chief Complaint: SOB, nausea   HPI: Sherry [name] is a 85 y.o. female with medical history significant for atrial fibrillation on Eliquis, coronary artery disease, chronic diastolic CHF, COPD, ongoing tobacco abuse, and history of ischemic CVA in July 2020, now presenting to the emergency department for evaluation of nausea, shortness of breath, and general malaise for the past 2 days.  Patient reports increased dyspnea and increase in her chronic cough, but has not produced any sputum.  She has not noted any subjective fevers or chills at home.  She has not experienced any chest pain recently and denies any lower extremity swelling or tenderness.  With worsening dyspnea and malaise, EMS was called and found the patient to be febrile to 101.7 F and tachycardic to 120.  ED Course: Upon arrival to the ED, patient is found to be afebrile, saturating mid 90s on room air, mildly tachypneic, tachycardic to 110, and with blood pressure 140/107.  EKG future sinus tachycardia with rate on 920, PVCs, and diffuse ST-T abnormalities.  Chest x-ray features chronic cardiomegaly, pulmonary edema that has decreased since the prior study, chronic COPD changes, and patchy bibasilar opacity that could reflect atelectasis or pneumonia.  Chemistry panel was notable for creatinine 1.01, similar to priors.  CBC is unremarkable.  Lactic acid is normal.  COVID-19 is negative.  High-sensitivity troponin is elevated to 763.  Blood cultures were collected and patient was treated with acetaminophen, vancomycin, and cefepime in the ED.  Hospitalists are consulted for admission.  ED physician agrees to discuss with cardiology.  Review of Systems:  All other systems reviewed and apart from HPI, are negative.  Past Medical History:  Diagnosis Date  . Atrial fibrillation (HCC)    . CAD (coronary artery disease)   . Chronic diastolic CHF (congestive heart failure) (HCC)   . COPD (chronic obstructive pulmonary disease) (HCC)   . Hypercholesteremia   . Hypertension   . Ischemic stroke (HCC)   . Moderate tricuspid regurgitation     Past Surgical History:  Procedure Laterality Date  . CORONARY ANGIOPLASTY WITH STENT PLACEMENT    . HIP SURGERY       reports that she has been smoking cigarettes. She has been smoking about 1.00 pack per day. She has never used smokeless tobacco. She reports that she does not drink alcohol or use drugs.  No Known Allergies  Family History  Problem Relation Age of Onset  . Heart attack Mother      Prior to Admission medications   Medication Sig Start Date End Date Taking? Authorizing Provider  acetaminophen (TYLENOL) 500 MG tablet Take 2 tablets (1,000 mg total) by mouth every 8 (eight) hours as needed for mild pain, fever or headache. Patient taking differently: Take 1,000 mg by mouth every 4 (four) hours as needed (for pain).  10/13/17   Joseph ArtVann, Jessica U, DO  apixaban (ELIQUIS) 2.5 MG TABS tablet Take 1 tablet (2.5 mg total) by mouth 2 (two) times daily. 03/15/19   Standley BrookingGoodrich, Daniel P, MD  atorvastatin (LIPITOR) 40 MG tablet Take 40 mg by mouth daily.  12/22/16   [provider]  diltiazem (CARDIZEM CD) 240 MG 24 hr capsule Take 1 capsule (240 mg total) by mouth daily. 03/16/19   Standley BrookingGoodrich, Daniel P, MD  ferrous sulfate 325 (65 FE) MG tablet Take 1  tablet (325 mg total) by mouth daily with breakfast. 03/16/19   Samuella Cota, MD  hydrOXYzine (ATARAX/VISTARIL) 50 MG tablet Take 50 mg by mouth daily.  01/20/19   [provider]  ipratropium-albuterol (DUONEB) 0.5-2.5 (3) MG/3ML SOLN Take 3 mLs by nebulization every 4 (four) hours as needed (sob/wheezing). Patient not taking: Reported on 03/05/2019 10/13/17   Geradine Girt, DO  telmisartan (MICARDIS) 40 MG tablet Take 40 mg by mouth daily. 12/31/18   [provider]    Physical Exam: Vitals:   05/29/19 2240 05/29/19 2258  BP:  (!) 139/107  Pulse:  (!) 112  Resp:  (!) 24  Temp:  99.4 F (37.4 C)  TempSrc:  Oral  SpO2:  96%  Weight: 52.2 kg   Height: 5\' 4"  (1.626 m)     Constitutional: NAD, calm, frail, no diaphoresis  Eyes: PERTLA, lids and conjunctivae normal ENMT: Mucous membranes are moist. Posterior pharynx clear of any exudate or lesions.   Neck: normal, supple, no masses, no thyromegaly Respiratory: Mild tachypnea, speaking full sentences, no wheezing. No cyanosis .  Cardiovascular: S1 & S2 heard, regular rate and rhythm. No extremity edema.  Abdomen: No distension, no tenderness, soft. Bowel sounds active.  Musculoskeletal: no clubbing / cyanosis. No joint deformity upper and lower extremities.    Skin: no significant rashes, lesions, ulcers. Warm, dry, well-perfused. Neurologic: No facial asymmetry. Sensation intact. Moving all extremities.  Psychiatric: Alert and oriented to person, place, and situation. Slow to answer some questions, but answers appropriately. Very pleasant, cooperative.    Labs on Admission: I have personally reviewed following labs and imaging studies  CBC: Recent Labs  Lab 05/29/19 2303  WBC 9.5  NEUTROABS 8.1*  HGB 14.3  HCT 45.6  MCV 96.8  PLT 585   Basic Metabolic Panel: Recent Labs  Lab 05/29/19 2303  NA 139  K 3.7  CL 104  CO2 24  GLUCOSE 127*  BUN 17  CREATININE 1.01*  CALCIUM 9.3   GFR: Estimated Creatinine Clearance: 31.7 mL/min (A) (by C-G formula based on SCr of 1.01 mg/dL (H)). Liver Function Tests: Recent Labs  Lab 05/29/19 2303  AST 21  ALT 11  ALKPHOS 82  BILITOT 0.9  PROT 7.3  ALBUMIN 4.0   No results for input(s): LIPASE, AMYLASE in the last 168 hours. No results for input(s): AMMONIA in the last 168 hours. Coagulation Profile: No results for input(s): INR, PROTIME in the last 168 hours. Cardiac Enzymes: No results for input(s): CKTOTAL, CKMB,  CKMBINDEX, TROPONINI in the last 168 hours. BNP (last 3 results) No results for input(s): PROBNP in the last 8760 hours. HbA1C: No results for input(s): HGBA1C in the last 72 hours. CBG: No results for input(s): GLUCAP in the last 168 hours. Lipid Profile: No results for input(s): CHOL, HDL, LDLCALC, TRIG, CHOLHDL, LDLDIRECT in the last 72 hours. Thyroid Function Tests: No results for input(s): TSH, T4TOTAL, FREET4, T3FREE, THYROIDAB in the last 72 hours. Anemia Panel: No results for input(s): VITAMINB12, FOLATE, FERRITIN, TIBC, IRON, RETICCTPCT in the last 72 hours. Urine analysis:    Component Value Date/Time   COLORURINE AMBER (A) 03/05/2019 1730   APPEARANCEUR CLOUDY (A) 03/05/2019 1730   LABSPEC 1.018 03/05/2019 1730   PHURINE 5.0 03/05/2019 1730   GLUCOSEU NEGATIVE 03/05/2019 1730   HGBUR NEGATIVE 03/05/2019 1730   BILIRUBINUR NEGATIVE 03/05/2019 1730   KETONESUR 5 (A) 03/05/2019 1730   PROTEINUR 100 (A) 03/05/2019 1730   UROBILINOGEN 0.2 06/27/2009 1600  NITRITE NEGATIVE 03/05/2019 1730   LEUKOCYTESUR SMALL (A) 03/05/2019 1730   Sepsis Labs: (procalcitonin:4,lacticidven:4) ) Recent Results (from the past 240 hour(s))  SARS Coronavirus 2 [facility] [place] order, Performed in [facility] hospital lab) Nasopharyngeal Nasopharyngeal Swab     Status: None   Collection Time: 05/29/19 11:03 PM   Specimen: Nasopharyngeal Swab  Result Value Ref Range Status   SARS Coronavirus 2 NEGATIVE NEGATIVE Final    Comment: (NOTE) If result is NEGATIVE SARS-CoV-2 target nucleic acids are NOT DETECTED. The SARS-CoV-2 RNA is generally detectable in upper and lower  respiratory specimens during the acute phase of infection. The lowest  concentration of SARS-CoV-2 viral copies this assay can detect is 250  copies / mL. A negative result does not preclude SARS-CoV-2 infection  and should not be used as the sole basis for treatment or other  patient management decisions.  A negative  result may occur with  improper specimen collection / handling, submission of specimen other  than nasopharyngeal swab, presence of viral mutation(s) within the  areas targeted by this assay, and inadequate number of viral copies  (<250 copies / mL). A negative result must be combined with clinical  observations, patient history, and epidemiological information. If result is POSITIVE SARS-CoV-2 target nucleic acids are DETECTED. The SARS-CoV-2 RNA is generally detectable in upper and lower  respiratory specimens dur ing the acute phase of infection.  Positive  results are indicative of active infection with SARS-CoV-2.  Clinical  correlation with patient history and other diagnostic information is  necessary to determine patient infection status.  Positive results do  not rule out bacterial infection or co-infection with other viruses. If result is PRESUMPTIVE POSTIVE SARS-CoV-2 nucleic acids MAY BE PRESENT.   A presumptive positive result was obtained on the submitted specimen  and confirmed on repeat testing.  While 2019 novel coronavirus  (SARS-CoV-2) nucleic acids may be present in the submitted sample  additional confirmatory testing may be necessary for epidemiological  and / or clinical management purposes  to differentiate between  SARS-CoV-2 and other Sarbecovirus currently known to infect humans.  If clinically indicated additional testing with an alternate test  methodology [phone]) is advised. The SARS-CoV-2 RNA is generally  detectable in upper and lower respiratory sp ecimens during the acute  phase of infection. The expected result is Negative. Fact Sheet for Patients:  BoilerBrush.com.cy Fact Sheet for Healthcare Providers: https://pope.com/ This test is not yet approved or cleared by the [place] FDA and has been authorized for detection and/or diagnosis of SARS-CoV-2 by FDA under an Emergency Use Authorization  (EUA).  This EUA will remain in effect (meaning this test can be used) for the duration of the COVID-19 declaration under Section 564(b)(1) of the Act, 21 U.S.C. section 360bbb-3(b)(1), unless the authorization is terminated or revoked sooner. Performed at [facility], [address], [place], [place] [address]      Radiological Exams on Admission: Dg Chest Port 1 View  Result Date: 05/29/2019 CLINICAL DATA:  Shortness of breath. Fever. EXAM: PORTABLE CHEST 1 VIEW COMPARISON:  Radiograph 03/14/2019, CT 10/12/2017 FINDINGS: Chronic cardiomegaly. Aortic atherosclerosis and tortuosity. Pulmonary edema, less than on prior exam. Superimposed patchy bibasilar opacities. Chronic hyperinflation. No large pleural effusion. No pneumothorax. The bones are under mineralized with scoliotic curvature of the spine. IMPRESSION: 1. Chronic cardiomegaly. Pulmonary edema, less than on prior exam. Patchy bibasilar opacities may be atelectasis or pneumonia. 2. Chronic hyperinflation and bronchial thickening consistent with COPD. Electronically Signed  By: Narda Rutherford M.D.   On: 05/29/2019 23:29    EKG: Independently reviewed. Sinus tachycardia (rate 120), PVC's, diffuse ST-T abnormalities.   Assessment/Plan   1. NSTEMI   - Presents with ~2 days nausea and SOB, was reportedly febrile with EMS, and is found to have HS troponin of 763 with ST-T abnormalities on EKG, improved pulm edema and possible PNA (vs atelcetasis) on CXR  - She is hypertensive with DBP >100  - Treat with ASA 324 mg and Lopressor now, continue statin, hold Eliquis and start IV heparin, trend troponin and EKG's, ED physician asked to consult with cardiology    2. Suspected PNA  - Presents with malaise, nausea, and SOB, reported to be febrile with EMS, and found to have bibasilar opacities on CXR that could reflect PNA  - COVID-19 is negative in ED  - She was cultured in ED and started on antibiotics  - Check procalcitonin, strep  pneumo and legionella antigens, and sputum culture - Continue antibiotics with Rocephin and azithromycin, follow cultures and clinical course    3. Chronic diastolic CHF  - Appears compensated  - Continue ARB, follow daily wt and I/O's  4. Paroxysmal atrial fibrillation  - In sinus rhythm on admission  - CHADS-VASc is 35 (age x2, CVA x2, CHF, gender, CAD) - Continue Eliquis, diltiazem    5. History of CVA  - Admitted with subacute cardioembolic CVA in July 2020  - Continue statin, Eliquis    6. Mild renal insufficiency  - SCr is 1.01 on admission (GFR 50), similar to priors  - Monitor   7. COPD  - No wheezing, may have pneumonia as above  - Continue nebs, supplemental O2 as needed    PPE: Mask, face shield  DVT prophylaxis: Eliquis pta, now IV heparin  Code Status: Full  Family Communication: Discussed with patient  Consults called: ED physician asked to consult cardiology   Admission status: Inpatient. Patient has NSTEMI and suspected PNA, is at particularly high risk of life-threatening complications given her age and frailty, and she will require inpatient management with specialist consultation.    Briscoe Deutscher, MD [facility] Pager [phone]  If 7PM-7AM, please contact night-coverage [email or web address] Password [email or web address]  05/30/2019, 1:42 AM

## 2019-05-30 NOTE — Plan of Care (Signed)

## 2019-05-30 NOTE — Progress Notes (Addendum)
The patient is independently interviewed and examined.  I have reviewed the full consultation note from Dr. Charissa Bash dictated early this morning.  The patient is currently stable.  Her heart rate has normalized, now at 80 bpm.  Her telemetry shows single PVCs but no sustained arrhythmia.  The patient is chest pain-free and denies shortness of breath at rest.  I have reviewed her labs and EKG.  Her troponin is now trending downward.  Her EKG shows marked T wave changes and QT prolongation which are new from her EKG in July of this year.  She is no longer in atrial fibrillation, now in normal sinus rhythm.  Differential diagnosis includes demand ischemia, ACS, or acute stress cardiomyopathy such as Takotsubo.  Considering her advanced age and lack of chest pain, I would favor a conservative approach to her care.  Would continue IV heparin.  I am going to order a limited 2D echo to reassess LV function.  LV function was normal just a few months ago.  Otherwise would continue current medical therapy.  I spoke to her daughter over the telephone this morning and updated her on her plan of care.   Sherren Mocha 05/30/2019 8:50 AM

## 2019-05-30 NOTE — Progress Notes (Signed)
ANTICOAGULATION CONSULT NOTE - Initial Consult  Pharmacy Consult for Heparin (Apixaban on hold) Indication: chest pain/ACS and atrial fibrillation  No Known Allergies  Patient Measurements: Height: 5\' 4"  (162.6 cm) Weight: 115 lb (52.2 kg) IBW/kg (Calculated) : 54.7  Vital Signs: Temp: 99.4 F (37.4 C) (09/27 2258) Temp Source: Oral (09/27 2258) BP: 139/107 (09/27 2258) Pulse Rate: 112 (09/27 2258)  Labs: Recent Labs    05/29/19 2303  HGB 14.3  HCT 45.6  PLT 247  CREATININE 1.01*  TROPONINIHS 763*    Estimated Creatinine Clearance: 31.7 mL/min (A) (by C-G formula based on SCr of 1.01 mg/dL (H)).   Medical History: Past Medical History:  Diagnosis Date  . Atrial fibrillation ([place])   . CAD (coronary artery disease)   . Chronic diastolic CHF (congestive heart failure) ([place])   . COPD (chronic obstructive pulmonary disease) ([place])   . Hypercholesteremia   . Hypertension   . Ischemic stroke ([place])   . Moderate tricuspid regurgitation     Assessment: 85 y/o F with shortness of breath and nausea. High sensitivity troponin elevated. CBC/renal function OK. Pt actually doesn't remember if she took her medications this past evening or this past morning. Likely hasn't had her Apixaban in >12 hours, will go ahead and start heparin now. Anticipate using aPTT to dose for now given Apixaban influence on heparin levels.  Goal of Therapy:  Heparin level 0.3-0.7 units/ml aPTT 66-102 seconds Monitor platelets by anticoagulation protocol: Yes   Plan:  -Start heparin drip at 600 units/hr -Check aPTT/HL in 8 hours -Daily CBC/aPTT/HL -Monitor for bleeding  Narda Bonds, PharmD, BCPS Clinical Pharmacist Phone: [phone]

## 2019-05-30 NOTE — Plan of Care (Signed)

## 2019-05-31 DIAGNOSIS — I5181 Takotsubo syndrome: Principal | ICD-10-CM

## 2019-05-31 LAB — CBC
HCT: 37.9 % (ref 36.0–46.0)
Hemoglobin: 12.1 g/dL (ref 12.0–15.0)
MCH: 30.1 pg (ref 26.0–34.0)
MCHC: 31.9 g/dL (ref 30.0–36.0)
MCV: 94.3 fL (ref 80.0–100.0)
Platelets: 183 10*3/uL (ref 150–400)
RBC: 4.02 MIL/uL (ref 3.87–5.11)
RDW: 15.7 % — ABNORMAL HIGH (ref 11.5–15.5)
WBC: 5.5 10*3/uL (ref 4.0–10.5)
nRBC: 0 % (ref 0.0–0.2)

## 2019-05-31 LAB — BASIC METABOLIC PANEL
Anion gap: 9 (ref 5–15)
BUN: 23 mg/dL (ref 8–23)
CO2: 24 mmol/L (ref 22–32)
Calcium: 8.7 mg/dL — ABNORMAL LOW (ref 8.9–10.3)
Chloride: 105 mmol/L (ref 98–111)
Creatinine, Ser: 0.9 mg/dL (ref 0.44–1.00)
GFR calc Af Amer: >60 mL/min (ref >60)
GFR calc non Af Amer: 57 mL/min — ABNORMAL LOW (ref >60)
Glucose, Bld: 93 mg/dL (ref 70–99)
Potassium: 3.4 mmol/L — ABNORMAL LOW (ref 3.5–5.1)
Sodium: 138 mmol/L (ref 135–145)

## 2019-05-31 LAB — PROCALCITONIN: Procalcitonin: <0.1 ng/mL

## 2019-05-31 LAB — MAGNESIUM: Magnesium: 1.7 mg/dL (ref 1.7–2.4)

## 2019-05-31 LAB — LEGIONELLA PNEUMOPHILA SEROGP 1 UR AG: L. pneumophila Serogp 1 Ur Ag: NEGATIVE

## 2019-05-31 LAB — TROPONIN I (HIGH SENSITIVITY): Troponin I (High Sensitivity): 104 ng/L (ref <18)

## 2019-05-31 LAB — HEPARIN LEVEL (UNFRACTIONATED): Heparin Unfractionated: 0.26 IU/mL — ABNORMAL LOW (ref 0.30–0.70)

## 2019-05-31 MED ORDER — APIXABAN 2.5 MG PO TABS
2.5000 mg | ORAL_TABLET | Freq: Two times a day (BID) | ORAL | Status: DC
  Administered 2019-05-31 – 2019-06-02 (×5): 2.5 mg via ORAL
  Filled 2019-05-31 (×5): qty 1

## 2019-05-31 MED ORDER — ENSURE ENLIVE PO LIQD
237.0000 mL | Freq: Two times a day (BID) | ORAL | Status: DC
  Administered 2019-05-31 – 2019-06-01 (×2): 237 mL via ORAL

## 2019-05-31 MED ORDER — METOPROLOL SUCCINATE ER 50 MG PO TB24
50.0000 mg | ORAL_TABLET | Freq: Every day | ORAL | Status: DC
  Administered 2019-05-31 – 2019-06-01 (×2): 50 mg via ORAL
  Filled 2019-05-31 (×3): qty 1

## 2019-05-31 MED ORDER — ADULT MULTIVITAMIN W/MINERALS CH
1.0000 | ORAL_TABLET | Freq: Every day | ORAL | Status: DC
  Administered 2019-05-31 – 2019-06-02 (×3): 1 via ORAL
  Filled 2019-05-31 (×3): qty 1

## 2019-05-31 NOTE — Progress Notes (Signed)
PROGRESS NOTE    Sherry WELD  [name]:[id number] DOB: 12-28-1928 DOA: 05/29/2019 PCP: Darrow Bussing, MD   Brief Narrative: Sherry [name] is a 85 y.o. female with medical history significant for atrial fibrillation on Eliquis, coronary artery disease, chronic diastolic CHF, COPD, ongoing tobacco abuse, and history of ischemic CVA. Patient presented secondary to nausea and dyspnea and found to have concern for NSTEMI in setting of elevated troponin and EKG t-wave changes.   Assessment & Plan:   Principal Problem:   NSTEMI (non-ST elevated myocardial infarction) (HCC) Active Problems:   Pneumonia   AF (paroxysmal atrial fibrillation) (HCC)   Mild renal insufficiency   History of CVA (cerebrovascular accident)   Hypertensive urgency   COPD (chronic obstructive pulmonary disease) (HCC)   Elevated troponin Possible stress induced cardiomyopathy. Treated conservatively with heparin drip. Troponin down-trending. Patient is asymptomatic. -Cardiology recommendations: discontinue heparin drip, stopped diltiazem, Start Toprol XL and restart Eliquis  Paroxysmal atrial fibrillation Patient started on amiodarone IV with resultant transient bradycardia. Amiodarone stopped by cardiology. Diltiazem also stopped as mentioned above. -Cardiology recommendations: Toprol, Eliquis  Possible pneumonia Started on Ceftriaxone and azithromycin on admission. Procalcitonin negative without clinical symptoms of pneumonia. Antibiotics discontinued. Patient stable. Pneumonia appears less likely.  Chronic diastolic heart failure Euvolemic.  History of CVA -Eliquis restarted. Continue statin  COPD Stable.   DVT prophylaxis: Eliquis Code Status:   Code Status: Full Code Family Communication: None at bedside Disposition Plan: Discharge pending cardiology recommendations   Consultants:   Cardiology  Procedures:   9/28: Transthoracic Echocardiogram IMPRESSIONS    1. Left ventricular ejection  fraction, by visual estimation, is 25 to 30%. The left ventricle has normal function. Normal left ventricular size. There is severely increased left ventricular hypertrophy.  2. Entire apex, mid and apical inferior wall, mid and apical inferolateral wall, apical anterior and apical septal walls are abnormal.  3. Global right ventricle has normal systolic function.The right ventricular size is normal. No increase in right ventricular wall thickness.  4. Left atrial size was normal.  5. Right atrial size was normal.  6. The mitral valve is normal in structure. Mild mitral valve regurgitation. No evidence of mitral stenosis.  7. The tricuspid valve is normal in structure. Tricuspid valve regurgitation moderate.  8. The aortic valve is tricuspid Aortic valve regurgitation is mild to moderate by color flow Doppler. Mild to moderate aortic valve sclerosis/calcification without any evidence of aortic stenosis.  9. The pulmonic valve was normal in structure. Pulmonic valve regurgitation is mild by color flow Doppler. 10. Moderately elevated pulmonary artery systolic pressure with PASP estimated at . 11. The inferior vena cava is dilated in size with <50% respiratory variability, suggesting right atrial pressure of 15 mmHg.  FINDINGS  Left Ventricle: Left ventricular ejection fraction, by visual estimation, is 25 to 30%. The left ventricle has normal function. There is severely increased left ventricular hypertrophy. Asymmetric left ventricular hypertrophy. Normal left ventricular  size.    LV Wall Scoring: The entire apex, mid and apical inferior wall, mid inferolateral segment, and mid anterolateral segment are akinetic. All remaining scored segments are normal.  Right Ventricle: The right ventricular size is normal. No increase in right ventricular wall thickness. Global RV systolic function is has normal systolic function. The tricuspid regurgitant velocity is 3.44 m/s, and with an assumed  right atrial pressure  of 15 mmHg, the estimated right ventricular systolic pressure is moderately elevated at 62.3 mmHg.  Left Atrium: Left atrial size was normal  in size.  Right Atrium: Right atrial size was normal in size  Pericardium: There is no evidence of pericardial effusion.  Mitral Valve: The mitral valve is normal in structure. No evidence of mitral valve stenosis by observation. Mild mitral valve regurgitation.  Tricuspid Valve: The tricuspid valve is normal in structure. Tricuspid valve regurgitation moderate by color flow Doppler.  Aortic Valve: The aortic valve is tricuspid. Aortic valve regurgitation is mild to moderate by color flow Doppler. Aortic regurgitation PHT measures 366 msec. Mild to moderate aortic valve sclerosis/calcification is present, without any evidence of  aortic stenosis.  Pulmonic Valve: The pulmonic valve was normal in structure. Pulmonic valve regurgitation is mild by color flow Doppler.  Aorta: The aortic root, ascending aorta and aortic arch are all structurally normal, with no evidence of dilitation or obstruction.  Venous: The inferior vena cava is dilated in size with less than 50% respiratory variability, suggesting right atrial pressure of 15 mmHg.  Shunts: No ventricular septal defect is seen or detected. There is no evidence of an atrial septal defect. No atrial level shunt detected by color flow Doppler.  Antimicrobials:  Ceftriaxone  Azithromycin    Subjective: Patient reports no issues overnight. States it is early for her today. No chest pain or dyspnea overnight. Per chart review, bradycardia to 30s overnight  Objective: Vitals:   05/31/19 0237 05/31/19 0800 05/31/19 0806 05/31/19 0958  BP: 120/68 138/71  (!) 147/82  Pulse: (!) 56 69 72 72  Resp: 19 18 17    Temp: 98.2 F (36.8 C)  97.9 F (36.6 C)   TempSrc: Oral  Axillary   SpO2: 97% 93% 96%   Weight:      Height:        Intake/Output Summary (Last 24  hours) at 05/31/2019 1012 Last data filed at 05/31/2019 0400 Gross per 24 hour  Intake 637.36 ml  Output -  Net 637.36 ml   Filed Weights   05/29/19 2240 05/30/19 1310 05/31/19 0230  Weight: 52.2 kg 51 kg 49.6 kg    Examination:  General exam: Appears calm and comfortable Respiratory system: Clear to auscultation. Respiratory effort normal. Cardiovascular system: S1 & S2 heard, RRR. No murmurs, rubs, gallops or clicks. Gastrointestinal system: Abdomen is nondistended, soft and nontender. No organomegaly or masses felt. Normal bowel sounds heard. Central nervous system: Alert and oriented to person and year. No focal neurological deficits. Extremities: No edema. No calf tenderness Skin: No cyanosis. No rashes Psychiatry: Judgement and insight appear normal. Mood & affect appropriate.     Data Reviewed: I have personally reviewed following labs and imaging studies  CBC: Recent Labs  Lab 05/29/19 2303 05/31/19 0156  WBC 9.5 5.5  NEUTROABS 8.1*  --   HGB 14.3 12.1  HCT 45.6 37.9  MCV 96.8 94.3  PLT 247 183   Basic Metabolic Panel: Recent Labs  Lab 05/29/19 2303 05/30/19 1113 05/31/19 0156  NA 139 140 138  K 3.7 3.5 3.4*  CL 104 108 105  CO2 24 22 24   GLUCOSE 127* 83 93  BUN 17 21 23   CREATININE 1.01* 0.98 0.90  CALCIUM 9.3 8.7* 8.7*  MG  --   --  1.7   GFR: Estimated Creatinine Clearance: 33.8 mL/min (by C-G formula based on SCr of 0.9 mg/dL). Liver Function Tests: Recent Labs  Lab 05/29/19 2303  AST 21  ALT 11  ALKPHOS 82  BILITOT 0.9  PROT 7.3  ALBUMIN 4.0   No results for input(s): LIPASE, AMYLASE  in the last 168 hours. No results for input(s): AMMONIA in the last 168 hours. Coagulation Profile: No results for input(s): INR, PROTIME in the last 168 hours. Cardiac Enzymes: No results for input(s): CKTOTAL, CKMB, CKMBINDEX, TROPONINI in the last 168 hours. BNP (last 3 results) No results for input(s): PROBNP in the last 8760 hours. HbA1C: No  results for input(s): HGBA1C in the last 72 hours. CBG: No results for input(s): GLUCAP in the last 168 hours. Lipid Profile: No results for input(s): CHOL, HDL, LDLCALC, TRIG, CHOLHDL, LDLDIRECT in the last 72 hours. Thyroid Function Tests: No results for input(s): TSH, T4TOTAL, FREET4, T3FREE, THYROIDAB in the last 72 hours. Anemia Panel: No results for input(s): VITAMINB12, FOLATE, FERRITIN, TIBC, IRON, RETICCTPCT in the last 72 hours. Sepsis Labs: Recent Labs  Lab 05/29/19 2303 05/30/19 0548 05/31/19 0156  PROCALCITON  --  <0.10 <0.10  LATICACIDVEN 1.9  --   --     Recent Results (from the past 240 hour(s))  Blood culture (routine x 2)     Status: None (Preliminary result)   Collection Time: 05/29/19 11:03 PM   Specimen: BLOOD  Result Value Ref Range Status   Specimen Description BLOOD RIGHT ANTECUBITAL  Final   Special Requests   Final    BOTTLES DRAWN AEROBIC AND ANAEROBIC Blood Culture adequate volume   Culture   Final    NO GROWTH 1 DAY Performed at [facility], [place] [address] [place], [place] [address]    Report Status PENDING  Incomplete  SARS Coronavirus 2 [facility] order, Performed in [facility] hospital lab) Nasopharyngeal Nasopharyngeal Swab     Status: None   Collection Time: 05/29/19 11:03 PM   Specimen: Nasopharyngeal Swab  Result Value Ref Range Status   SARS Coronavirus 2 NEGATIVE NEGATIVE Final    Comment: (NOTE) If result is NEGATIVE SARS-CoV-2 target nucleic acids are NOT DETECTED. The SARS-CoV-2 RNA is generally detectable in upper and lower  respiratory specimens during the acute phase of infection. The lowest  concentration of SARS-CoV-2 viral copies this assay can detect is 250  copies / mL. A negative result does not preclude SARS-CoV-2 infection  and should not be used as the sole basis for treatment or other  patient management decisions.  A negative result may occur with  improper specimen collection / handling, submission of  specimen other  than nasopharyngeal swab, presence of viral mutation(s) within the  areas targeted by this assay, and inadequate number of viral copies  (<250 copies / mL). A negative result must be combined with clinical  observations, patient history, and epidemiological information. If result is POSITIVE SARS-CoV-2 target nucleic acids are DETECTED. The SARS-CoV-2 RNA is generally detectable in upper and lower  respiratory specimens dur ing the acute phase of infection.  Positive  results are indicative of active infection with SARS-CoV-2.  Clinical  correlation with patient history and other diagnostic information is  necessary to determine patient infection status.  Positive results do  not rule out bacterial infection or co-infection with other viruses. If result is PRESUMPTIVE POSTIVE SARS-CoV-2 nucleic acids MAY BE PRESENT.   A presumptive positive result was obtained on the submitted specimen  and confirmed on repeat testing.  While 2019 novel coronavirus  (SARS-CoV-2) nucleic acids may be present in the submitted sample  additional confirmatory testing may be necessary for epidemiological  and / or clinical management purposes  to differentiate between  SARS-CoV-2 and other Sarbecovirus currently known to infect humans.  If  clinically indicated additional testing with an alternate test  methodology [phone]) is advised. The SARS-CoV-2 RNA is generally  detectable in upper and lower respiratory sp ecimens during the acute  phase of infection. The expected result is Negative. Fact Sheet for Patients:  BoilerBrush.com.cy Fact Sheet for Healthcare Providers: https://pope.com/ This test is not yet approved or cleared by the [place] FDA and has been authorized for detection and/or diagnosis of SARS-CoV-2 by FDA under an Emergency Use Authorization (EUA).  This EUA will remain in effect (meaning this test can be used) for the  duration of the COVID-19 declaration under Section 564(b)(1) of the Act, 21 U.S.C. section 360bbb-3(b)(1), unless the authorization is terminated or revoked sooner. Performed at [facility], [address], [place], [place] [address]   Blood culture (routine x 2)     Status: None (Preliminary result)   Collection Time: 05/30/19 11:13 AM   Specimen: BLOOD  Result Value Ref Range Status   Specimen Description BLOOD RIGHT ANTECUBITAL  Final   Special Requests   Final    BOTTLES DRAWN AEROBIC AND ANAEROBIC Blood Culture adequate volume   Culture   Final    NO GROWTH < 24 HOURS Performed at [facility], [address], [place], [place] [address]    Report Status PENDING  Incomplete         Radiology Studies: Dg Chest Port 1 View  Result Date: 05/29/2019 CLINICAL DATA:  Shortness of breath. Fever. EXAM: PORTABLE CHEST 1 VIEW COMPARISON:  Radiograph 03/14/2019, CT 10/12/2017 FINDINGS: Chronic cardiomegaly. Aortic atherosclerosis and tortuosity. Pulmonary edema, less than on prior exam. Superimposed patchy bibasilar opacities. Chronic hyperinflation. No large pleural effusion. No pneumothorax. The bones are under mineralized with scoliotic curvature of the spine. IMPRESSION: 1. Chronic cardiomegaly. Pulmonary edema, less than on prior exam. Patchy bibasilar opacities may be atelectasis or pneumonia. 2. Chronic hyperinflation and bronchial thickening consistent with COPD. Electronically Signed   By: Narda Rutherford M.D.   On: 05/29/2019 23:29        Scheduled Meds: . apixaban  2.5 mg Oral BID  . aspirin EC  81 mg Oral Daily  . atorvastatin  40 mg Oral q1800  . [START ON 06/01/2019] influenza vaccine adjuvanted  0.5 mL Intramuscular Tomorrow-1000  . irbesartan  150 mg Oral Daily  . metoprolol succinate  50 mg Oral Daily   Continuous Infusions:   LOS: 1 day     Jacquelin Hawking, MD [facility] 05/31/2019, 10:12 AM  If 7PM-7AM, please contact  night-coverage [email or web address]

## 2019-05-31 NOTE — Plan of Care (Signed)

## 2019-05-31 NOTE — Progress Notes (Signed)
At 0153 pt brady down to 33 bpm. Pt was asymptomatic. EKG completed and Cardiology on call paged. On call MD gave verbal order to stop amiodarone drip. Pt HR currently 50's-60's with BP of 120/68. Will continue to monitor.  Tressie Ellis, RN

## 2019-05-31 NOTE — Plan of Care (Signed)

## 2019-05-31 NOTE — Progress Notes (Addendum)
Progress Note  Patient Name: Sherry [name] Date of Encounter: 05/31/2019  Primary Cardiologist: New to [facility]  Subjective   No chest pain or dyspnea.   Inpatient Medications    Scheduled Meds: . aspirin EC  81 mg Oral Daily  . atorvastatin  40 mg Oral q1800  . diltiazem  240 mg Oral Daily  . [START ON 06/01/2019] influenza vaccine adjuvanted  0.5 mL Intramuscular Tomorrow-1000  . irbesartan  150 mg Oral Daily   Continuous Infusions: . heparin 600 Units/hr (05/31/19 0400)   PRN Meds: acetaminophen, ipratropium-albuterol, nitroGLYCERIN, ondansetron (ZOFRAN) IV   Vital Signs    Vitals:   05/31/19 0230 05/31/19 0237 05/31/19 0800 05/31/19 0806  BP:  120/68 138/71   Pulse:  (!) 56 69 72  Resp:  19 18 17   Temp:  98.2 F (36.8 C)  97.9 F (36.6 C)  TempSrc:  Oral  Axillary  SpO2:  97% 93% 96%  Weight: 49.6 kg     Height:        Intake/Output Summary (Last 24 hours) at 05/31/2019 0814 Last data filed at 05/31/2019 0400 Gross per 24 hour  Intake 637.36 ml  Output -  Net 637.36 ml   Last 3 Weights 05/31/2019 05/30/2019 05/29/2019  Weight (lbs) 109 lb 4.8 oz 112 lb 7 oz 115 lb  Weight (kg) 49.578 kg 51 kg 52.164 kg      Telemetry    Sinus with PACs - Personally Reviewed  ECG    Sinus, PVCs, deep T wave inversions anterolateral leads - Personally Reviewed  Physical Exam   GEN: Thin elderly female in NAD Neck: No JVD Cardiac: RRR, systolic murmur.   Respiratory: Clear to auscultation bilaterally. GI: Soft, nontender, non-distended  MS: No LE edema; No deformity. Neuro:  Nonfocal  Psych: Normal affect   Labs    High Sensitivity Troponin:   Recent Labs  Lab 05/29/19 2303 05/30/19 0548 05/30/19 1113  TROPONINIHS 763* 476* 290*      Chemistry Recent Labs  Lab 05/29/19 2303 05/30/19 1113 05/31/19 0156  NA 139 140 138  K 3.7 3.5 3.4*  CL 104 108 105  CO2 24 22 24   GLUCOSE 127* 83 93  BUN 17 21 23   CREATININE 1.01* 0.98 0.90  CALCIUM 9.3 8.7*  8.7*  PROT 7.3  --   --   ALBUMIN 4.0  --   --   AST 21  --   --   ALT 11  --   --   ALKPHOS 82  --   --   BILITOT 0.9  --   --   GFRNONAA 50* 52* 57*  GFRAA 58* 60* >60  ANIONGAP 11 10 9      Hematology Recent Labs  Lab 05/29/19 2303 05/31/19 0156  WBC 9.5 5.5  RBC 4.71 4.02  HGB 14.3 12.1  HCT 45.6 37.9  MCV 96.8 94.3  MCH 30.4 30.1  MCHC 31.4 31.9  RDW 15.9* 15.7*  PLT 247 183    BNPNo results for input(s): BNP, PROBNP in the last 168 hours.   DDimer No results for input(s): DDIMER in the last 168 hours.   Radiology    Dg Chest Port 1 View  Result Date: 05/29/2019 CLINICAL DATA:  Shortness of breath. Fever. EXAM: PORTABLE CHEST 1 VIEW COMPARISON:  Radiograph 03/14/2019, CT 10/12/2017 FINDINGS: Chronic cardiomegaly. Aortic atherosclerosis and tortuosity. Pulmonary edema, less than on prior exam. Superimposed patchy bibasilar opacities. Chronic hyperinflation. No large pleural effusion. No pneumothorax. The  bones are under mineralized with scoliotic curvature of the spine. IMPRESSION: 1. Chronic cardiomegaly. Pulmonary edema, less than on prior exam. Patchy bibasilar opacities may be atelectasis or pneumonia. 2. Chronic hyperinflation and bronchial thickening consistent with COPD. Electronically Signed   By: Keith Rake M.D.   On: 05/29/2019 23:29    Cardiac Studies   Echo 05/31/19:   1. Left ventricular ejection fraction, by visual estimation, is 25 to 30%. The left ventricle has normal function. Normal left ventricular size. There is severely increased left ventricular hypertrophy.  2. Entire apex, mid and apical inferior wall, mid and apical inferolateral wall, apical anterior and apical septal walls are abnormal.  3. Global right ventricle has normal systolic function.The right ventricular size is normal. No increase in right ventricular wall thickness.  4. Left atrial size was normal.  5. Right atrial size was normal.  6. The mitral valve is normal in  structure. Mild mitral valve regurgitation. No evidence of mitral stenosis.  7. The tricuspid valve is normal in structure. Tricuspid valve regurgitation moderate.  8. The aortic valve is tricuspid Aortic valve regurgitation is mild to moderate by color flow Doppler. Mild to moderate aortic valve sclerosis/calcification without any evidence of aortic stenosis.  9. The pulmonic valve was normal in structure. Pulmonic valve regurgitation is mild by color flow Doppler. 10. Moderately elevated pulmonary artery systolic pressure with PASP estimated at 82mmHg. 11. The inferior vena cava is dilated in size with <50% respiratory variability, suggesting right atrial pressure of 15 mmHg.  Patient Profile     85 y.o. female with history of atrial fibrillation, CAD, diastolic CHF, COPD, tobacco abuse and CVA admitted with nausea, malaise and dyspnea. She was febrile at the time of admission with elevated troponin (Hs troponin peak 763).  Assessment & Plan    1. Elevated troponin: She has elevated troponin in the setting of febrile illness. She has no chest pain. Echo with apical wall motion abnormality consistent with Takotsubo cardiomyopathy (stress induced cardiomyopathy). The downward troponin trend would support this rather than true ACS. I would favor conservative management. I will stop her heparin. Her Eliquis can be restarted today. Will stop her Diltiazem given new LV dysfunction and will start Toprol 50 mg daily. Continue irbesartan.   2. Atrial fibrillation, paroxysmal: Will stop amiodarone this morning. As above, will stop Diltiazem given new LV dysfunction and will start Toprol 50 mg daily. Will resume Eliquis today as there are no plans for invasive cardiac workup.   I would favor monitoring her today with the change in her medications. I updated her daughter by phone this am. We will follow along with you.   For questions or updates, please contact [place] Please consult [email or web address]  for contact info under        Signed, Lauree Chandler, MD  05/31/2019, 8:14 AM

## 2019-05-31 NOTE — Progress Notes (Signed)
[place] for Heparin (Apixaban on hold) Indication: chest pain/ACS and atrial fibrillation  No Known Allergies  Patient Measurements: Height: 5\' 6"  (167.6 cm) Weight: 109 lb 4.8 oz (49.6 kg) IBW/kg (Calculated) : 59.3  Vital Signs: Temp: 98.2 F (36.8 C) (09/29 0237) Temp Source: Oral (09/29 0237) BP: 120/68 (09/29 0237) Pulse Rate: 56 (09/29 0237)  Labs: Recent Labs    05/29/19 2303 05/30/19 0548 05/30/19 1113 05/31/19 0156  HGB 14.3  --   --  12.1  HCT 45.6  --   --  37.9  PLT 247  --   --  183  APTT  --   --  62*  --   HEPARINUNFRC  --   --  0.38 0.26*  CREATININE 1.01*  --  0.98 0.90  TROPONINIHS 763* 476* 290*  --     Estimated Creatinine Clearance: 33.8 mL/min (by C-G formula based on SCr of 0.9 mg/dL).   Medical History: Past Medical History:  Diagnosis Date  . Atrial fibrillation ([place])   . CAD (coronary artery disease)   . Chronic diastolic CHF (congestive heart failure) ([place])   . COPD (chronic obstructive pulmonary disease) ([place])   . Hypercholesteremia   . Hypertension   . Ischemic stroke ([place])   . Moderate tricuspid regurgitation     Assessment: 85 y/o F with shortness of breath and nausea. Pharmacy dosing heparin for possible ACS. She is on apixaban PTA for afib with last dose on 9/27. Echo on 9/28 revealed hypokinesis. -PLT and Hgb WNL -Heparin level subtherapeutic at 0.26 this morning     Goal of Therapy:  Heparin level 0.3-0.7 units/ml aPTT 66-102 seconds Monitor platelets by anticoagulation protocol: Yes   Plan:  -Increase heparin to 700 units/hr -Daily heparin level and CBC  Emeline General, PharmD Candidate  **Pharmacist phone directory can now be found on [email or web address] ([email or web address]).  Listed under [place].

## 2019-05-31 NOTE — Progress Notes (Signed)
Initial Nutrition Assessment  RD working remotely.  DOCUMENTATION CODES:   Underweight  INTERVENTION:   - Ensure Enlive po BID, each supplement provides 350 kcal and 20 grams of protein  - MVI with minerals daily  NUTRITION DIAGNOSIS:   Increased nutrient needs related to chronic illness (COPD, CHF) as evidenced by estimated needs.  GOAL:   Patient will meet greater than or equal to 90% of their needs  MONITOR:   PO intake, Supplement acceptance, Labs, Weight trends, I & O's  REASON FOR ASSESSMENT:   Other (low BMI)    ASSESSMENT:   85 year old female who presented to the ED on 9/27 with SOB and nausea. PMH of atrial fibrillation, CAD, CHF, HTN, HLD, prior stroke, COPD. Admitted with NSTEMI.   Unable to reach pt via phone call to room.  No meal completions recorded at this time.  Reviewed weight history in chart. Pt with a 2 kg weight gain since 03/05/19. Current weight appears consistent with weight from 10/13/17 encounter.  RD will order an oral nutrition supplement to aid pt in meeting kcal and protein needs during admission. Will also order daily MVI with minerals.  Medications reviewed.  Labs reviewed: potassium 3.4  NUTRITION - FOCUSED PHYSICAL EXAM:  Unable to complete at this time. RD working remotely.  Diet Order:   Diet Order            Diet Heart Room service appropriate? Yes; Fluid consistency: Thin  Diet effective now              EDUCATION NEEDS:   No education needs have been identified at this time  Skin:  Skin Assessment: Reviewed RN Assessment  Last BM:  05/28/19 per pt  Height:   Ht Readings from Last 1 Encounters:  05/30/19 5\' 6"  (1.676 m)    Weight:   Wt Readings from Last 1 Encounters:  05/31/19 49.6 kg    Ideal Body Weight:  59.1 kg  BMI:  Body mass index is 17.64 kg/m.  Estimated Nutritional Needs:   Kcal:  1250-1450  Protein:  60-75 grams  Fluid:  1.3-1.5 L    Gaynell Face, MS, RD,  LDN Inpatient Clinical Dietitian Pager: [phone] Weekend/After Hours: [phone]

## 2019-06-01 ENCOUNTER — Inpatient Hospital Stay (HOSPITAL_COMMUNITY): Payer: Medicare HMO

## 2019-06-01 ENCOUNTER — Other Ambulatory Visit: Payer: Self-pay

## 2019-06-01 DIAGNOSIS — I480 Paroxysmal atrial fibrillation: Secondary | ICD-10-CM

## 2019-06-01 DIAGNOSIS — R001 Bradycardia, unspecified: Secondary | ICD-10-CM

## 2019-06-01 LAB — BASIC METABOLIC PANEL
Anion gap: 9 (ref 5–15)
BUN: 27 mg/dL — ABNORMAL HIGH (ref 8–23)
CO2: 26 mmol/L (ref 22–32)
Calcium: 8.9 mg/dL (ref 8.9–10.3)
Chloride: 101 mmol/L (ref 98–111)
Creatinine, Ser: 0.97 mg/dL (ref 0.44–1.00)
GFR calc Af Amer: >60 mL/min (ref >60)
GFR calc non Af Amer: 52 mL/min — ABNORMAL LOW (ref >60)
Glucose, Bld: 110 mg/dL — ABNORMAL HIGH (ref 70–99)
Potassium: 3.8 mmol/L (ref 3.5–5.1)
Sodium: 136 mmol/L (ref 135–145)

## 2019-06-01 LAB — CBC
HCT: 37.8 % (ref 36.0–46.0)
Hemoglobin: 12 g/dL (ref 12.0–15.0)
MCH: 29.9 pg (ref 26.0–34.0)
MCHC: 31.7 g/dL (ref 30.0–36.0)
MCV: 94.3 fL (ref 80.0–100.0)
Platelets: 209 10*3/uL (ref 150–400)
RBC: 4.01 MIL/uL (ref 3.87–5.11)
RDW: 15.5 % (ref 11.5–15.5)
WBC: 6.4 10*3/uL (ref 4.0–10.5)
nRBC: 0 % (ref 0.0–0.2)

## 2019-06-01 MED ORDER — AMIODARONE HCL IN DEXTROSE 360-4.14 MG/200ML-% IV SOLN
60.0000 mg/h | INTRAVENOUS | Status: DC
  Filled 2019-06-01: qty 200

## 2019-06-01 MED ORDER — FUROSEMIDE 10 MG/ML IJ SOLN
20.0000 mg | Freq: Once | INTRAMUSCULAR | Status: AC
  Administered 2019-06-01: 20 mg via INTRAVENOUS
  Filled 2019-06-01: qty 2

## 2019-06-01 MED ORDER — AMIODARONE LOAD VIA INFUSION
150.0000 mg | Freq: Once | INTRAVENOUS | Status: AC
  Administered 2019-06-01: 14:00:00 150 mg via INTRAVENOUS
  Filled 2019-06-01: qty 83.34

## 2019-06-01 MED ORDER — AMIODARONE HCL IN DEXTROSE 360-4.14 MG/200ML-% IV SOLN
30.0000 mg/h | INTRAVENOUS | Status: DC
  Filled 2019-06-01 (×2): qty 200

## 2019-06-01 NOTE — Progress Notes (Signed)
RN received telephone call from Meire [place], P.A.C.E. [phone]. She is available to facilitate any home equipment at time of Ms. Cobb's d/c.

## 2019-06-01 NOTE — Progress Notes (Signed)
VAST consulted to place new PIV d/t leaking. Assessed IV in pt's left hand; slight bleeding around IV, but no fluid leaking (flushed with 68mLs NS). IV site redressed and unit RN notified.

## 2019-06-01 NOTE — Progress Notes (Signed)
PROGRESS NOTE    Sherry [name]  WUJ:[id number] DOB: 05-13-1929 DOA: 05/29/2019 PCP: Darrow Bussing, MD   Brief Narrative: Sherry [name] is a 85 y.o. female with medical history significant for atrial fibrillation on Eliquis, coronary artery disease, chronic diastolic CHF, COPD, ongoing tobacco abuse, and history of ischemic CVA. Patient presented secondary to nausea and dyspnea and found to have concern for NSTEMI in setting of elevated troponin and EKG t-wave changes.   Assessment & Plan:   Principal Problem:   NSTEMI (non-ST elevated myocardial infarction) (HCC) Active Problems:   Pneumonia   AF (paroxysmal atrial fibrillation) (HCC)   Mild renal insufficiency   History of CVA (cerebrovascular accident)   Hypertensive urgency   COPD (chronic obstructive pulmonary disease) (HCC)   Elevated troponin Possible stress induced cardiomyopathy. Treated conservatively with heparin drip. Troponin down-trending. Patient is asymptomatic. -Cardiology recommendations: discontinue heparin drip, stopped diltiazem, Start Toprol XL and restart Eliquis. Started amiodarone  Paroxysmal atrial fibrillation Patient started on amiodarone IV with resultant transient bradycardia. Amiodarone stopped by cardiology. Diltiazem also stopped as mentioned above. -Cardiology recommendations: Toprol, Eliquis  Possible pneumonia Started on Ceftriaxone and azithromycin on admission. Procalcitonin negative without clinical symptoms of pneumonia. Antibiotics discontinued. Patient stable. Pneumonia appears less likely.  Chronic diastolic heart failure Euvolemic.  History of CVA -Eliquis restarted. Continue statin  COPD Stable.  Dyspnea Patient with new mild dyspnea and cough. During evaluation, patient noticed to choke while drinking coffee. She states this happens some times -SLP evaluation -Chest x-ray   DVT prophylaxis: Eliquis Code Status:   Code Status: Full Code Family Communication: None at  bedside Disposition Plan: Discharge pending cardiology recommendations   Consultants:   Cardiology  Procedures:   9/28: Transthoracic Echocardiogram IMPRESSIONS    1. Left ventricular ejection fraction, by visual estimation, is 25 to 30%. The left ventricle has normal function. Normal left ventricular size. There is severely increased left ventricular hypertrophy.  2. Entire apex, mid and apical inferior wall, mid and apical inferolateral wall, apical anterior and apical septal walls are abnormal.  3. Global right ventricle has normal systolic function.The right ventricular size is normal. No increase in right ventricular wall thickness.  4. Left atrial size was normal.  5. Right atrial size was normal.  6. The mitral valve is normal in structure. Mild mitral valve regurgitation. No evidence of mitral stenosis.  7. The tricuspid valve is normal in structure. Tricuspid valve regurgitation moderate.  8. The aortic valve is tricuspid Aortic valve regurgitation is mild to moderate by color flow Doppler. Mild to moderate aortic valve sclerosis/calcification without any evidence of aortic stenosis.  9. The pulmonic valve was normal in structure. Pulmonic valve regurgitation is mild by color flow Doppler. 10. Moderately elevated pulmonary artery systolic pressure with PASP estimated at . 11. The inferior vena cava is dilated in size with <50% respiratory variability, suggesting right atrial pressure of 15 mmHg.  FINDINGS  Left Ventricle: Left ventricular ejection fraction, by visual estimation, is 25 to 30%. The left ventricle has normal function. There is severely increased left ventricular hypertrophy. Asymmetric left ventricular hypertrophy. Normal left ventricular  size.    LV Wall Scoring: The entire apex, mid and apical inferior wall, mid inferolateral segment, and mid anterolateral segment are akinetic. All remaining scored segments are normal.  Right Ventricle: The  right ventricular size is normal. No increase in right ventricular wall thickness. Global RV systolic function is has normal systolic function. The tricuspid regurgitant velocity is 3.44 m/s, and  with an assumed right atrial pressure  of 15 mmHg, the estimated right ventricular systolic pressure is moderately elevated at 62.3 mmHg.  Left Atrium: Left atrial size was normal in size.  Right Atrium: Right atrial size was normal in size  Pericardium: There is no evidence of pericardial effusion.  Mitral Valve: The mitral valve is normal in structure. No evidence of mitral valve stenosis by observation. Mild mitral valve regurgitation.  Tricuspid Valve: The tricuspid valve is normal in structure. Tricuspid valve regurgitation moderate by color flow Doppler.  Aortic Valve: The aortic valve is tricuspid. Aortic valve regurgitation is mild to moderate by color flow Doppler. Aortic regurgitation PHT measures 366 msec. Mild to moderate aortic valve sclerosis/calcification is present, without any evidence of  aortic stenosis.  Pulmonic Valve: The pulmonic valve was normal in structure. Pulmonic valve regurgitation is mild by color flow Doppler.  Aorta: The aortic root, ascending aorta and aortic arch are all structurally normal, with no evidence of dilitation or obstruction.  Venous: The inferior vena cava is dilated in size with less than 50% respiratory variability, suggesting right atrial pressure of 15 mmHg.  Shunts: No ventricular septal defect is seen or detected. There is no evidence of an atrial septal defect. No atrial level shunt detected by color flow Doppler.  Antimicrobials:  Ceftriaxone  Azithromycin    Subjective: Patient with some mild dyspnea this morning.   Objective: Vitals:   06/01/19 0424 06/01/19 0814 06/01/19 0837 06/01/19 1129  BP:  (!) 188/98 (!) 154/97 134/75  Pulse:      Resp: (!) 23 16    Temp: 98.1 F (36.7 C) 99 F (37.2 C)  98.4 F (36.9 C)   TempSrc: Oral Oral  Oral  SpO2:   92% 92%  Weight: 51.1 kg     Height:        Intake/Output Summary (Last 24 hours) at 06/01/2019 1230 Last data filed at 06/01/2019 1200 Gross per 24 hour  Intake 480 ml  Output 900 ml  Net -420 ml   Filed Weights   05/30/19 1310 05/31/19 0230 06/01/19 0424  Weight: 51 kg 49.6 kg 51.1 kg    Examination:  General exam: Appears calm and comfortable  Respiratory system: Clear to auscultation but mildly diminished. Respiratory effort normal. Cardiovascular system: S1 & S2 heard, irregular rhythm.  Gastrointestinal system: Abdomen is nondistended, soft and nontender. No organomegaly or masses felt. Normal bowel sounds heard. Central nervous system: Alert. No focal neurological deficits. Extremities: No edema. No calf tenderness Skin: No cyanosis. No rashes Psychiatry: Judgement and insight appear normal. Mood & affect appropriate.     Data Reviewed: I have personally reviewed following labs and imaging studies  CBC: Recent Labs  Lab 05/29/19 2303 05/31/19 0156 06/01/19 0328  WBC 9.5 5.5 6.4  NEUTROABS 8.1*  --   --   HGB 14.3 12.1 12.0  HCT 45.6 37.9 37.8  MCV 96.8 94.3 94.3  PLT 247 183 209   Basic Metabolic Panel: Recent Labs  Lab 05/29/19 2303 05/30/19 1113 05/31/19 0156 06/01/19 0328  NA 139 140 138 136  K 3.7 3.5 3.4* 3.8  CL 104 108 105 101  CO2 24 22 24 26   GLUCOSE 127* 83 93 110*  BUN 17 21 23  27*  CREATININE 1.01* 0.98 0.90 0.97  CALCIUM 9.3 8.7* 8.7* 8.9  MG  --   --  1.7  --    GFR: Estimated Creatinine Clearance: 32.3 mL/min (by C-G formula based on SCr of 0.97  mg/dL). Liver Function Tests: Recent Labs  Lab 05/29/19 2303  AST 21  ALT 11  ALKPHOS 82  BILITOT 0.9  PROT 7.3  ALBUMIN 4.0   No results for input(s): LIPASE, AMYLASE in the last 168 hours. No results for input(s): AMMONIA in the last 168 hours. Coagulation Profile: No results for input(s): INR, PROTIME in the last 168 hours. Cardiac  Enzymes: No results for input(s): CKTOTAL, CKMB, CKMBINDEX, TROPONINI in the last 168 hours. BNP (last 3 results) No results for input(s): PROBNP in the last 8760 hours. HbA1C: No results for input(s): HGBA1C in the last 72 hours. CBG: No results for input(s): GLUCAP in the last 168 hours. Lipid Profile: No results for input(s): CHOL, HDL, LDLCALC, TRIG, CHOLHDL, LDLDIRECT in the last 72 hours. Thyroid Function Tests: No results for input(s): TSH, T4TOTAL, FREET4, T3FREE, THYROIDAB in the last 72 hours. Anemia Panel: No results for input(s): VITAMINB12, FOLATE, FERRITIN, TIBC, IRON, RETICCTPCT in the last 72 hours. Sepsis Labs: Recent Labs  Lab 05/29/19 2303 05/30/19 0548 05/31/19 0156  PROCALCITON  --  <0.10 <0.10  LATICACIDVEN 1.9  --   --     Recent Results (from the past 240 hour(s))  Blood culture (routine x 2)     Status: None (Preliminary result)   Collection Time: 05/29/19 11:03 PM   Specimen: BLOOD  Result Value Ref Range Status   Specimen Description BLOOD RIGHT ANTECUBITAL  Final   Special Requests   Final    BOTTLES DRAWN AEROBIC AND ANAEROBIC Blood Culture adequate volume   Culture   Final    NO GROWTH 2 DAYS Performed at [facility], [address] [place], [place] [address]    Report Status PENDING  Incomplete  SARS Coronavirus 2 [facility] order, Performed in [facility] hospital lab) Nasopharyngeal Nasopharyngeal Swab     Status: None   Collection Time: 05/29/19 11:03 PM   Specimen: Nasopharyngeal Swab  Result Value Ref Range Status   SARS Coronavirus 2 NEGATIVE NEGATIVE Final    Comment: (NOTE) If result is NEGATIVE SARS-CoV-2 target nucleic acids are NOT DETECTED. The SARS-CoV-2 RNA is generally detectable in upper and lower  respiratory specimens during the acute phase of infection. The lowest  concentration of SARS-CoV-2 viral copies this assay can detect is 250  copies / mL. A negative result does not preclude SARS-CoV-2 infection  and  should not be used as the sole basis for treatment or other  patient management decisions.  A negative result may occur with  improper specimen collection / handling, submission of specimen other  than nasopharyngeal swab, presence of viral mutation(s) within the  areas targeted by this assay, and inadequate number of viral copies  (<250 copies / mL). A negative result must be combined with clinical  observations, patient history, and epidemiological information. If result is POSITIVE SARS-CoV-2 target nucleic acids are DETECTED. The SARS-CoV-2 RNA is generally detectable in upper and lower  respiratory specimens dur ing the acute phase of infection.  Positive  results are indicative of active infection with SARS-CoV-2.  Clinical  correlation with patient history and other diagnostic information is  necessary to determine patient infection status.  Positive results do  not rule out bacterial infection or co-infection with other viruses. If result is PRESUMPTIVE POSTIVE SARS-CoV-2 nucleic acids MAY BE PRESENT.   A presumptive positive result was obtained on the submitted specimen  and confirmed on repeat testing.  While 2019 novel coronavirus  (SARS-CoV-2) nucleic acids may be present  in the submitted sample  additional confirmatory testing may be necessary for epidemiological  and / or clinical management purposes  to differentiate between  SARS-CoV-2 and other Sarbecovirus currently known to infect humans.  If clinically indicated additional testing with an alternate test  methodology [phone]) is advised. The SARS-CoV-2 RNA is generally  detectable in upper and lower respiratory sp ecimens during the acute  phase of infection. The expected result is Negative. Fact Sheet for Patients:  StrictlyIdeas.no Fact Sheet for Healthcare Providers: BankingDealers.co.za This test is not yet approved or cleared by the [place] FDA and has been  authorized for detection and/or diagnosis of SARS-CoV-2 by FDA under an Emergency Use Authorization (EUA).  This EUA will remain in effect (meaning this test can be used) for the duration of the COVID-19 declaration under Section 564(b)(1) of the Act, 21 U.S.C. section 360bbb-3(b)(1), unless the authorization is terminated or revoked sooner. Performed at [facility], [place] [address] [place], [place] [address]   Blood culture (routine x 2)     Status: None (Preliminary result)   Collection Time: 05/30/19 11:13 AM   Specimen: BLOOD  Result Value Ref Range Status   Specimen Description BLOOD RIGHT ANTECUBITAL  Final   Special Requests   Final    BOTTLES DRAWN AEROBIC AND ANAEROBIC Blood Culture adequate volume   Culture   Final    NO GROWTH 2 DAYS Performed at [facility], [place] [address] [place], [place] [address]    Report Status PENDING  Incomplete         Radiology Studies: Dg Chest Port 1 View  Result Date: 06/01/2019 CLINICAL DATA:  Shortness of breath EXAM: PORTABLE CHEST 1 VIEW COMPARISON:  05/29/2019 FINDINGS: There is hyperinflation of the lungs compatible with COPD. Cardiomegaly. Diffuse interstitial prominence throughout the lungs likely reflects interstitial edema. No effusions or pneumothorax. No acute bony abnormality. IMPRESSION: Stable cardiomegaly, COPD. Interstitial prominence likely reflects interstitial edema. Electronically Signed   By: Rolm Baptise M.D.   On: 06/01/2019 08:35        Scheduled Meds: . amiodarone  150 mg Intravenous Once  . apixaban  2.5 mg Oral BID  . atorvastatin  40 mg Oral q1800  . feeding supplement (ENSURE ENLIVE)  237 mL Oral BID BM  . influenza vaccine adjuvanted  0.5 mL Intramuscular Tomorrow-1000  . irbesartan  150 mg Oral Daily  . metoprolol succinate  50 mg Oral Daily  . multivitamin with minerals  1 tablet Oral Daily   Continuous Infusions: . amiodarone     Followed by  . amiodarone       LOS: 2 days      Cordelia Poche, MD [facility] 06/01/2019, 12:30 PM  If 7PM-7AM, please contact night-coverage [email or web address]

## 2019-06-01 NOTE — Progress Notes (Signed)
Pt  assist X 1 to chair @ 08:30AFIB  heart rhythm noted at this time. Goal to reduce oxygenation needs and provide aspiration precautions. Pt tolerated chair less than 1 hour due to back pain.

## 2019-06-01 NOTE — Plan of Care (Signed)

## 2019-06-01 NOTE — Evaluation (Signed)
Clinical/Bedside Swallow Evaluation Patient Details  Name: Sherry [name] MRN: [id number] Date of Birth: [birth year]  Today's Date: 06/01/2019 Time: SLP Start Time (ACUTE ONLY): 0903 SLP Stop Time (ACUTE ONLY): 0925 SLP Time Calculation (min) (ACUTE ONLY): 22 min  Past Medical History:  Past Medical History:  Diagnosis Date  . Atrial fibrillation ([place])   . CAD (coronary artery disease)   . Chronic diastolic CHF (congestive heart failure) ([place])   . COPD (chronic obstructive pulmonary disease) ([place])   . Hypercholesteremia   . Hypertension   . Ischemic stroke ([place])   . Moderate tricuspid regurgitation    Past Surgical History:  Past Surgical History:  Procedure Laterality Date  . CORONARY ANGIOPLASTY WITH STENT PLACEMENT    . HIP SURGERY     HPI:  Sherry [name] is a 85 y.o. female with medical history significant for atrial fibrillation on Eliquis, coronary artery disease, chronic diastolic CHF, COPD, ongoing tobacco abuse, and history of ischemic CVA. Patient presented secondary to nausea and dyspnea and found to have concern for NSTEMI in setting of elevated troponin and EKG t-wave changes. Chest xray reveals Stable cardiomegaly, COPD.   Assessment / Plan / Recommendation Clinical Impression  Pt appears at reduced risk of aspiration when following general aspiraiton precautions. Pt was free of overt s/s of oropharyngeal dysphagia or aspiration when consuming regular trials with thin liquids via straw. Nursing also states that pt consumed medication whole with thin liquids and no overt s/s of aspiration or dysphagia. All eudcation completed, ST to sign off.  SLP Visit Diagnosis: Dysphagia, unspecified (R13.10)    Aspiration Risk  No limitations    Diet Recommendation Regular;Thin liquid   Liquid Administration via: Cup;Straw Medication Administration: Whole meds with liquid Supervision: Patient able to self feed;Staff to assist with self feeding Compensations: Minimize  environmental distractions;Slow rate;Small sips/bites Postural Changes: Seated upright at 90 degrees    Other  Recommendations Oral Care Recommendations: Oral care BID   Follow up Recommendations None      Frequency and Duration            Prognosis        Swallow Study   General Date of Onset: 05/30/19 HPI: Sherry [name] is a 85 y.o. female with medical history significant for atrial fibrillation on Eliquis, coronary artery disease, chronic diastolic CHF, COPD, ongoing tobacco abuse, and history of ischemic CVA. Patient presented secondary to nausea and dyspnea and found to have concern for NSTEMI in setting of elevated troponin and EKG t-wave changes. Chest xray reveals Stable cardiomegaly, COPD. Type of Study: Bedside Swallow Evaluation Previous Swallow Assessment: none in chart Diet Prior to this Study: Regular;Thin liquids Temperature Spikes Noted: Yes Respiratory Status: Nasal cannula(1.5 liters) History of Recent Intubation: No Behavior/Cognition: Alert;Cooperative;Pleasant mood;Requires cueing Oral Cavity Assessment: Within Functional Limits Oral Care Completed by SLP: Recent completion by staff Oral Cavity - Dentition: Adequate natural dentition Vision: Functional for self-feeding Self-Feeding Abilities: Able to feed self;Needs assist Patient Positioning: Upright in bed Baseline Vocal Quality: Normal Volitional Cough: Strong Volitional Swallow: Able to elicit    Oral/Motor/Sensory Function Overall Oral Motor/Sensory Function: Within functional limits   Ice Chips Ice chips: Not tested   Thin Liquid Thin Liquid: Within functional limits Presentation: Cup;Self Fed;Straw    Nectar Thick Nectar Thick Liquid: Not tested   Honey Thick Honey Thick Liquid: Not tested   Puree Puree: Within functional limits Presentation: Self Fed;Spoon   Solid     Solid: Within functional limits Presentation:  Self Fed      Johndavid Geralds 06/01/2019,9:42 AM

## 2019-06-01 NOTE — Progress Notes (Addendum)
Progress Note  Patient Name: Sherry [name] Date of Encounter: 06/01/2019  Primary Cardiologist: No primary care provider on file.   Subjective   Laying in bed resting.   Inpatient Medications    Scheduled Meds: . apixaban  2.5 mg Oral BID  . atorvastatin  40 mg Oral q1800  . feeding supplement (ENSURE ENLIVE)  237 mL Oral BID BM  . influenza vaccine adjuvanted  0.5 mL Intramuscular Tomorrow-1000  . irbesartan  150 mg Oral Daily  . metoprolol succinate  50 mg Oral Daily  . multivitamin with minerals  1 tablet Oral Daily   Continuous Infusions:  PRN Meds: acetaminophen, ipratropium-albuterol, nitroGLYCERIN, ondansetron (ZOFRAN) IV   Vital Signs    Vitals:   05/31/19 2347 06/01/19 0424 06/01/19 0814 06/01/19 0837  BP: (!) 158/79  (!) 188/98 (!) 154/97  Pulse:      Resp: 20 (!) 23 16   Temp: 98.5 F (36.9 C) 98.1 F (36.7 C) 99 F (37.2 C)   TempSrc: Oral Oral Oral   SpO2:    92%  Weight:  51.1 kg    Height:        Intake/Output Summary (Last 24 hours) at 06/01/2019 1019 Last data filed at 06/01/2019 0843 Gross per 24 hour  Intake 480 ml  Output 900 ml  Net -420 ml   Last 3 Weights 06/01/2019 05/31/2019 05/30/2019  Weight (lbs) 112 lb 10.5 oz 109 lb 4.8 oz 112 lb 7 oz  Weight (kg) 51.1 kg 49.578 kg 51 kg      Telemetry    Back in Afib rates 110s - Personally Reviewed  ECG    No new tracing this morning - Personally Reviewed  Physical Exam  Frail older WF GEN: No acute distress.   Neck: No JVD Cardiac: Irreg Irreg, + systolic murmurs, no rubs, or gallops.  Respiratory: Clear to auscultation bilaterally. GI: Soft, nontender, non-distended  MS: No edema; No deformity. Neuro:  Nonfocal  Psych: Normal affect   Labs    High Sensitivity Troponin:   Recent Labs  Lab 05/29/19 2303 05/30/19 0548 05/30/19 1113 05/31/19 0821  TROPONINIHS 763* 476* 290* 104*      Chemistry Recent Labs  Lab 05/29/19 2303 05/30/19 1113 05/31/19 0156  06/01/19 0328  NA 139 140 138 136  K 3.7 3.5 3.4* 3.8  CL 104 108 105 101  CO2 24 22 24 26   GLUCOSE 127* 83 93 110*  BUN 17 21 23  27*  CREATININE 1.01* 0.98 0.90 0.97  CALCIUM 9.3 8.7* 8.7* 8.9  PROT 7.3  --   --   --   ALBUMIN 4.0  --   --   --   AST 21  --   --   --   ALT 11  --   --   --   ALKPHOS 82  --   --   --   BILITOT 0.9  --   --   --   GFRNONAA 50* 52* 57* 52*  GFRAA 58* 60* >60 >60  ANIONGAP 11 10 9 9      Hematology Recent Labs  Lab 05/29/19 2303 05/31/19 0156 06/01/19 0328  WBC 9.5 5.5 6.4  RBC 4.71 4.02 4.01  HGB 14.3 12.1 12.0  HCT 45.6 37.9 37.8  MCV 96.8 94.3 94.3  MCH 30.4 30.1 29.9  MCHC 31.4 31.9 31.7  RDW 15.9* 15.7* 15.5  PLT 247 183 209    BNPNo results for input(s): BNP, PROBNP in the last 168  hours.   DDimer No results for input(s): DDIMER in the last 168 hours.   Radiology    Dg Chest Port 1 View  Result Date: 06/01/2019 CLINICAL DATA:  Shortness of breath EXAM: PORTABLE CHEST 1 VIEW COMPARISON:  05/29/2019 FINDINGS: There is hyperinflation of the lungs compatible with COPD. Cardiomegaly. Diffuse interstitial prominence throughout the lungs likely reflects interstitial edema. No effusions or pneumothorax. No acute bony abnormality. IMPRESSION: Stable cardiomegaly, COPD. Interstitial prominence likely reflects interstitial edema. Electronically Signed   By: Charlett NoseKevin  Dover M.D.   On: 06/01/2019 08:35    Cardiac Studies   Echo 05/31/19:  1. Left ventricular ejection fraction, by visual estimation, is 25 to 30%. The left ventricle has normal function. Normal left ventricular size. There is severely increased left ventricular hypertrophy. 2. Entire apex, mid and apical inferior wall, mid and apical inferolateral wall, apical anterior and apical septal walls are abnormal. 3. Global right ventricle has normal systolic function.The right ventricular size is normal. No increase in right ventricular wall thickness. 4. Left atrial size was  normal. 5. Right atrial size was normal. 6. The mitral valve is normal in structure. Mild mitral valve regurgitation. No evidence of mitral stenosis. 7. The tricuspid valve is normal in structure. Tricuspid valve regurgitation moderate. 8. The aortic valve is tricuspid Aortic valve regurgitation is mild to moderate by color flow Doppler. Mild to moderate aortic valve sclerosis/calcification without any evidence of aortic stenosis. 9. The pulmonic valve was normal in structure. Pulmonic valve regurgitation is mild by color flow Doppler. 10. Moderately elevated pulmonary artery systolic pressure with PASP estimated at 62mmHg. 11. The inferior vena cava is dilated in size with <50% respiratory variability, suggesting right atrial pressure of 15 mmHg.  Patient Profile     85 y.o. female  with history of atrial fibrillation, CAD, diastolic CHF, COPD, tobacco abuse and CVA admitted with nausea, malaise and dyspnea. She was febrile at the time of admission with elevated troponin (Hs troponin peak 763).  Assessment & Plan    1. Elevated Troponin: troponin peaked at 763. Felt to be demand ischemia in the setting of acute illness. No chest pain throughout admission. Will stop ASA given need for OAC. On statin and BB therapy.   2. PAF: on amiodarone gtt briefly with conversion to SR yesterday. Started on Toprol XL yesterday. Now back on low dose Eliquis.  -- back in Afib this morning. Will restart amiodarone gtt, plan to convert to oral dosing once converted.   3.  Acute systolic HF: EF noted above 25-30% with entire apex, inferolateral walls being abnormal. On BB, and ARB. No signs of volume overload.   4. PNA?: treated with antibiotics per primary team for brief period.    For questions or updates, please contact [facility] Please consult [email or web address] for contact info under   Signed, Laverda PageLindsay Roberts, NP  06/01/2019, 10:19 AM    Patient seen, examined. Available data reviewed. Agree  with findings, assessment, and plan as outlined by Laverda PageLindsay Roberts, NP.  The patient has no specific complaints and she denies chest pain or shortness of breath.  On my exam, she is an elderly, confused woman in no distress.  Lung fields are clear, heart is bradycardic and regular without murmur gallop, abdomen is soft and nontender, extremities show no edema, skin is warm and dry with no rash.  Telemetry/EKG reviewed now demonstrating junctional bradycardia with heart rate 38 bpm.  The patient has tachycardia-bradycardia syndrome with paroxysmal atrial fibrillation.  IV amiodarone  was just discontinued because she has converted to sinus rhythm with a 4.2-second posttermination pause and now has junctional bradycardia.  She is hemodynamically stable with no associated symptoms.  We will stop amiodarone as it is clear she is not going to tolerate this.  We will hold her beta-blocker until her heart rhythm improves.  We will continue to watch on telemetry.  Tonny Bollman, M.D. 06/01/2019 3:31 PM

## 2019-06-01 NOTE — Evaluation (Signed)
Physical Therapy Evaluation Patient Details Name: Sherry [name] MRN: [id number] DOB: 05-19-1929 Today's Date: 06/01/2019   History of Present Illness  Sherry [name] is a 85 y.o. female with medical history significant for atrial fibrillation on Eliquis, coronary artery disease, chronic diastolic CHF, COPD, ongoing tobacco abuse, and history of ischemic CVA. Patient presented secondary to nausea and dyspnea and found to have concern for NSTEMI in setting of elevated troponin and EKG t-wave changes.  Clinical Impression  Patient presents with decreased mobility due to deficits listed in PT problem list.  Patient currently min A to sit up and mod for lateral transfers.  Poor historian, so prior function unclear, though she reports transfers to w/c on her own at times.  Feel she will benefit from skilled PT in the acute setting to maximize mobility prior to d/c home with family support and follow up HHPT.     Follow Up Recommendations Home health PT;Supervision/Assistance - 24 hour    Equipment Recommendations  None recommended by PT    Recommendations for Other Services       Precautions / Restrictions Precautions Precautions: Fall      Mobility  Bed Mobility Overal bed mobility: Needs Assistance Bed Mobility: Supine to Sit;Sit to Supine     Supine to sit: HOB elevated;Supervision Sit to supine: Mod assist   General bed mobility comments: able to move legs off bed and scoot to EOB with HOB elevated, but increased time and slow; to supine with assist for legs into bed, and RN assisted for +2 to scoot to [facility]  Transfers Overall transfer level: Needs assistance   Transfers: Lateral/Scoot Transfers          Lateral/Scoot Transfers: Mod assist General transfer comment: scooting up toward HOB x 2 along EOB with min to mod A  Ambulation/Gait             General Gait Details: NT  Stairs            Wheelchair Mobility    Modified Rankin (Stroke Patients Only)        Balance Overall balance assessment: Needs assistance Sitting-balance support: Feet supported Sitting balance-Leahy Scale: Fair         Standing balance comment: did not stand for session                             Pertinent Vitals/Pain Pain Assessment: Faces Faces Pain Scale: Hurts even more Pain Location: chronic back pain Pain Descriptors / Indicators: Aching Pain Intervention(s): Monitored during session;Repositioned    Home Living Family/patient expects to be discharged to:: Private residence Living Arrangements: Children Available Help at Discharge: Family Type of Home: House Home Access: Stairs to enter   Secretary/administrator of Steps: unsure Home Layout: One level Home Equipment: Environmental consultant - 2 wheels;Wheelchair - manual Additional Comments: poor historian, but relates she mobilizes in her wheelchair, but it does not fit through bathroom door so she stands holds door facing and sink to step to toilet    Prior Function Level of Independence: Needs assistance      ADL's / Homemaking Assistance Needed: has assist for sponge bathing per pt  Comments: daughter works, but patient reports she is not alone more than an hour at a time as she checks in frequently     Hand Dominance        Extremity/Trunk Assessment   Upper Extremity Assessment Upper Extremity Assessment: Overall WFL for tasks  assessed    Lower Extremity Assessment Lower Extremity Assessment: Generalized weakness    Cervical / Trunk Assessment Cervical / Trunk Assessment: Kyphotic  Communication   Communication: HOH  Cognition Arousal/Alertness: Awake/alert Behavior During Therapy: WFL for tasks assessed/performed Overall Cognitive Status: No family/caregiver present to determine baseline cognitive functioning                                 General Comments: oriented to situation, difficult to get history, but patient appropriate to setting      General  Comments General comments (skin integrity, edema, etc.): HR in 30's initially pt in supine, ranged up to 55 then back down to 30's during session, RN aware and discussing with MD over phone to turn off Amiodarone drip and initating EKG end of session    Exercises     Assessment/Plan    PT Assessment Patient needs continued PT services  PT Problem List Decreased activity tolerance;Decreased mobility;Decreased knowledge of use of DME;Cardiopulmonary status limiting activity       PT Treatment Interventions Functional mobility training;Therapeutic exercise;Patient/family education;Balance training;Therapeutic activities;DME instruction    PT Goals (Current goals can be found in the Care Plan section)  Acute Rehab PT Goals Patient Stated Goal: to go home PT Goal Formulation: With patient Time For Goal Achievement: 06/15/19 Potential to Achieve Goals: Fair    Frequency Min 3X/week   Barriers to discharge        Co-evaluation               AM-PAC PT "6 Clicks" Mobility  Outcome Measure Help needed turning from your back to your side while in a flat bed without using bedrails?: A Little Help needed moving from lying on your back to sitting on the side of a flat bed without using bedrails?: A Little Help needed moving to and from a bed to a chair (including a wheelchair)?: A Little Help needed standing up from a chair using your arms (e.g., wheelchair or bedside chair)?: A Lot Help needed to walk in hospital room?: Total Help needed climbing 3-5 steps with a railing? : Total 6 Click Score: 13    End of Session   Activity Tolerance: Treatment limited secondary to medical complications (Comment)(bradycardia/junctional rhythm) Patient left: in bed;with call bell/phone within reach;with nursing/sitter in room   PT Visit Diagnosis: Other abnormalities of gait and mobility (R26.89);Muscle weakness (generalized) (M62.81)    Time: 1420-1440 PT Time Calculation (min) (ACUTE  ONLY): 20 min   Charges:   PT Evaluation $PT Eval Moderate Complexity: [place], [place] Acute Rehabilitation Services [phone] 06/01/2019   Reginia Naas 06/01/2019, 3:47 PM

## 2019-06-02 DIAGNOSIS — R778 Other specified abnormalities of plasma proteins: Secondary | ICD-10-CM

## 2019-06-02 DIAGNOSIS — I160 Hypertensive urgency: Secondary | ICD-10-CM

## 2019-06-02 LAB — BASIC METABOLIC PANEL
Anion gap: 9 (ref 5–15)
BUN: 27 mg/dL — ABNORMAL HIGH (ref 8–23)
CO2: 24 mmol/L (ref 22–32)
Calcium: 9.1 mg/dL (ref 8.9–10.3)
Chloride: 103 mmol/L (ref 98–111)
Creatinine, Ser: 1.1 mg/dL — ABNORMAL HIGH (ref 0.44–1.00)
GFR calc Af Amer: 52 mL/min — ABNORMAL LOW (ref >60)
GFR calc non Af Amer: 45 mL/min — ABNORMAL LOW (ref >60)
Glucose, Bld: 96 mg/dL (ref 70–99)
Potassium: 4.1 mmol/L (ref 3.5–5.1)
Sodium: 136 mmol/L (ref 135–145)

## 2019-06-02 MED ORDER — METOPROLOL SUCCINATE ER 25 MG PO TB24
25.0000 mg | ORAL_TABLET | Freq: Every day | ORAL | Status: DC
  Administered 2019-06-02: 10:00:00 25 mg via ORAL
  Filled 2019-06-02: qty 1

## 2019-06-02 MED ORDER — METOPROLOL SUCCINATE ER 25 MG PO TB24: 25.0000 mg | ORAL_TABLET | Freq: Every day | ORAL | 0 refills | Status: DC

## 2019-06-02 NOTE — Consult Note (Signed)
   [facility] Inpatient Consult   06/02/2019  Sherry [name] [birth year] [id number]  THN Screen: [employer]/THN ACO  Chart reviewed for needs and review of inpatient [facility] team reveals that the patient will start care management services with [facility].  Patient's care management needs will be met in that program.  Plan: sign off  [name], RN BSN [facility] Liaison  [phone] business mobile phone Toll free office [phone]  Fax number: [phone] [name]

## 2019-06-02 NOTE — Discharge Summary (Signed)
Physician Discharge Summary  LORENA BENHAM GNF:[id number] DOB: 1928-09-20 DOA: 05/29/2019  PCP: Darrow Bussing, MD  Admit date: 05/29/2019 Discharge date: 06/02/2019  Admitted From: Home Disposition: Home  Recommendations for Outpatient Follow-up:  1. Follow up with PCP in 1 week 2. Please obtain BMP/CBC in one week 3. Do not use amiodarone. Added as an intolerance. 4. Please follow up on the following pending results: None  Home Health: PT Equipment/Devices: None  Discharge Condition: Stable CODE STATUS: Full code Diet recommendation: Heart healthy   Brief/Interim Summary:  Admission HPI written by Briscoe Deutscher, MD   Chief Complaint: SOB, nausea   HPI: Sherry [name] is a 85 y.o. female with medical history significant for atrial fibrillation on Eliquis, coronary artery disease, chronic diastolic CHF, COPD, ongoing tobacco abuse, and history of ischemic CVA in July 2020, now presenting to the emergency department for evaluation of nausea, shortness of breath, and general malaise for the past 2 days.  Patient reports increased dyspnea and increase in her chronic cough, but has not produced any sputum.  She has not noted any subjective fevers or chills at home.  She has not experienced any chest pain recently and denies any lower extremity swelling or tenderness.  With worsening dyspnea and malaise, EMS was called and found the patient to be febrile to 101.7 F and tachycardic to 120.  ED Course: Upon arrival to the ED, patient is found to be afebrile, saturating mid 90s on room air, mildly tachypneic, tachycardic to 110, and with blood pressure 140/107.  EKG future sinus tachycardia with rate on 920, PVCs, and diffuse ST-T abnormalities.  Chest x-ray features chronic cardiomegaly, pulmonary edema that has decreased since the prior study, chronic COPD changes, and patchy bibasilar opacity that could reflect atelectasis or pneumonia.  Chemistry panel was notable for creatinine 1.01,  similar to priors.  CBC is unremarkable.  Lactic acid is normal.  COVID-19 is negative.  High-sensitivity troponin is elevated to 763.  Blood cultures were collected and patient was treated with acetaminophen, vancomycin, and cefepime in the ED.  Hospitalists are consulted for admission.  ED physician agrees to discuss with cardiology.   Hospital course:  Elevated troponin Possible stress induced cardiomyopathy. Treated conservatively with heparin drip; cardiology consulted. Troponin down-trended. Patient is asymptomatic. Toprol XL started.  Paroxysmal atrial fibrillation Patient started on amiodarone IV with resultant transient bradycardia. Amiodarone stopped by cardiology secondary to bradyrhythmia. Diltiazem also stopped. Cardiology recommending Toprol XL and Eliquis  Possible pneumonia Started on Ceftriaxone and azithromycin on admission. Procalcitonin negative without clinical symptoms of pneumonia. Antibiotics discontinued. Patient stable. Pneumonia appears less likely.  Chronic diastolic heart failure Euvolemic.  History of CVA Eliquis restarted. Continue statin  COPD Stable.  Dyspnea Patient with new mild dyspnea and cough. During evaluation, patient noticed to choke while drinking coffee. She states this happens some times. Improved with some IV lasix. Euvolemic. Speech therapy consulted with no limitations to diet.  Discharge Diagnoses:  Principal Problem:   Elevated troponin Active Problems:   Pneumonia   AF (paroxysmal atrial fibrillation) (HCC)   Mild renal insufficiency   History of CVA (cerebrovascular accident)   Hypertensive urgency   COPD (chronic obstructive pulmonary disease) (HCC)    Discharge Instructions  Discharge Instructions    Diet - low sodium heart healthy   Complete by: As directed    Increase activity slowly   Complete by: As directed      Allergies as of 06/02/2019   No  Known Allergies     Medication List    STOP taking these  medications   diltiazem 240 MG 24 hr capsule Commonly known as: CARDIZEM CD     TAKE these medications   acetaminophen 500 MG tablet Commonly known as: TYLENOL Take 2 tablets (1,000 mg total) by mouth every 8 (eight) hours as needed for mild pain, fever or headache.   apixaban 2.5 MG Tabs tablet Commonly known as: ELIQUIS Take 1 tablet (2.5 mg total) by mouth 2 (two) times daily.   atorvastatin 40 MG tablet Commonly known as: LIPITOR Take 40 mg by mouth daily.   escitalopram 5 MG tablet Commonly known as: LEXAPRO Take 5 mg by mouth daily.   ferrous sulfate 325 (65 FE) MG tablet Take 1 tablet (325 mg total) by mouth daily with breakfast.   hydrOXYzine 50 MG tablet Commonly known as: ATARAX/VISTARIL Take 50 mg by mouth daily.   Ipratropium-Albuterol 20-100 MCG/ACT Aers respimat Commonly known as: COMBIVENT Inhale 1 puff into the lungs every 6 (six) hours as needed for wheezing or shortness of breath.   ipratropium-albuterol 0.5-2.5 (3) MG/3ML Soln Commonly known as: DUONEB Take 3 mLs by nebulization every 4 (four) hours as needed (sob/wheezing).   metoprolol succinate 25 MG 24 hr tablet Commonly known as: TOPROL-XL Take 1 tablet (25 mg total) by mouth daily. Start taking on: June 03, 2019   telmisartan 40 MG tablet Commonly known as: MICARDIS Take 40 mg by mouth daily.      Follow-up Information    Koirala, Dibas, MD. Schedule an appointment as soon as possible for a visit in 1 week(s).   Specialty: Family Medicine Contact information: [address] [place] [address] [phone]        Runell Gess, MD. Schedule an appointment as soon as possible for a visit in 1 week(s).   Specialties: Cardiology, Radiology Contact information: [address] [place] [address] [phone]          No Known Allergies  Consultations:  Cardiology   Procedures/Studies: Dg Chest Port 1 View  Result Date:  06/01/2019 CLINICAL DATA:  Shortness of breath EXAM: PORTABLE CHEST 1 VIEW COMPARISON:  05/29/2019 FINDINGS: There is hyperinflation of the lungs compatible with COPD. Cardiomegaly. Diffuse interstitial prominence throughout the lungs likely reflects interstitial edema. No effusions or pneumothorax. No acute bony abnormality. IMPRESSION: Stable cardiomegaly, COPD. Interstitial prominence likely reflects interstitial edema. Electronically Signed   By: Charlett Nose M.D.   On: 06/01/2019 08:35   Dg Chest Port 1 View  Result Date: 05/29/2019 CLINICAL DATA:  Shortness of breath. Fever. EXAM: PORTABLE CHEST 1 VIEW COMPARISON:  Radiograph 03/14/2019, CT 10/12/2017 FINDINGS: Chronic cardiomegaly. Aortic atherosclerosis and tortuosity. Pulmonary edema, less than on prior exam. Superimposed patchy bibasilar opacities. Chronic hyperinflation. No large pleural effusion. No pneumothorax. The bones are under mineralized with scoliotic curvature of the spine. IMPRESSION: 1. Chronic cardiomegaly. Pulmonary edema, less than on prior exam. Patchy bibasilar opacities may be atelectasis or pneumonia. 2. Chronic hyperinflation and bronchial thickening consistent with COPD. Electronically Signed   By: Narda Rutherford M.D.   On: 05/29/2019 23:29      Subjective: No issues today. Dyspnea resolved. No chest pain.  Discharge Exam: Vitals:   06/02/19 0745 06/02/19 1136  BP: (!) 143/71 117/84  Pulse: 60 93  Resp: 18 17  Temp: 98.4 F (36.9 C) 97.8 F (36.6 C)  SpO2: 92% 94%   Vitals:   06/02/19 0409 06/02/19 0500  06/02/19 0745 06/02/19 1136  BP: (!) 145/79  (!) 143/71 117/84  Pulse:   60 93  Resp: Temp: 97.6 F (36.4 C)  98.4 F (36.9 C) 97.8 F (36.6 C)  TempSrc: Oral  Oral Oral  SpO2:   92% 94%  Weight:  51.5 kg    Height:        General: Pt is alert, awake, not in acute distress Cardiovascular: RRR, S1/S2 +, no rubs, no gallops Respiratory: CTA bilaterally, no wheezing, no  rhonchi Abdominal: Soft, NT, ND, bowel sounds + Extremities: no edema, no cyanosis    The results of significant diagnostics from this hospitalization (including imaging, microbiology, ancillary and laboratory) are listed below for reference.     Microbiology: Recent Results (from the past 240 hour(s))  Blood culture (routine x 2)     Status: None (Preliminary result)   Collection Time: 05/29/19 11:03 PM   Specimen: BLOOD  Result Value Ref Range Status   Specimen Description BLOOD RIGHT ANTECUBITAL  Final   Special Requests   Final    BOTTLES DRAWN AEROBIC AND ANAEROBIC Blood Culture adequate volume   Culture   Final    NO GROWTH 3 DAYS Performed at [facility], [address], [place], [place] [address]    Report Status PENDING  Incomplete  SARS Coronavirus 2 [facility] order, Performed in [facility] hospital lab) Nasopharyngeal Nasopharyngeal Swab     Status: None   Collection Time: 05/29/19 11:03 PM   Specimen: Nasopharyngeal Swab  Result Value Ref Range Status   SARS Coronavirus 2 NEGATIVE NEGATIVE Final    Comment: (NOTE) If result is NEGATIVE SARS-CoV-2 target nucleic acids are NOT DETECTED. The SARS-CoV-2 RNA is generally detectable in upper and lower  respiratory specimens during the acute phase of infection. The lowest  concentration of SARS-CoV-2 viral copies this assay can detect is 250  copies / mL. A negative result does not preclude SARS-CoV-2 infection  and should not be used as the sole basis for treatment or other  patient management decisions.  A negative result may occur with  improper specimen collection / handling, submission of specimen other  than nasopharyngeal swab, presence of viral mutation(s) within the  areas targeted by this assay, and inadequate number of viral copies  (<250 copies / mL). A negative result must be combined with clinical  observations, patient history, and epidemiological information. If result is POSITIVE SARS-CoV-2  target nucleic acids are DETECTED. The SARS-CoV-2 RNA is generally detectable in upper and lower  respiratory specimens dur ing the acute phase of infection.  Positive  results are indicative of active infection with SARS-CoV-2.  Clinical  correlation with patient history and other diagnostic information is  necessary to determine patient infection status.  Positive results do  not rule out bacterial infection or co-infection with other viruses. If result is PRESUMPTIVE POSTIVE SARS-CoV-2 nucleic acids MAY BE PRESENT.   A presumptive positive result was obtained on the submitted specimen  and confirmed on repeat testing.  While 2019 novel coronavirus  (SARS-CoV-2) nucleic acids may be present in the submitted sample  additional confirmatory testing may be necessary for epidemiological  and / or clinical management purposes  to differentiate between  SARS-CoV-2 and other Sarbecovirus currently known to infect humans.  If clinically indicated additional testing with an alternate test  methodology [phone]) is advised. The SARS-CoV-2 RNA is generally  detectable in upper and lower respiratory sp ecimens during the acute  phase of infection. The expected result is Negative. Fact Sheet for Patients:  BoilerBrush.com.cy Fact Sheet for Healthcare Providers: https://pope.com/ This test is not yet approved or cleared by the [place] FDA and has been authorized for detection and/or diagnosis of SARS-CoV-2 by FDA under an Emergency Use Authorization (EUA).  This EUA will remain in effect (meaning this test can be used) for the duration of the COVID-19 declaration under Section 564(b)(1) of the Act, 21 U.S.C. section 360bbb-3(b)(1), unless the authorization is terminated or revoked sooner. Performed at [facility], [address], [place], [place] [address]   Blood culture (routine x 2)     Status: None (Preliminary result)    Collection Time: 05/30/19 11:13 AM   Specimen: BLOOD  Result Value Ref Range Status   Specimen Description BLOOD RIGHT ANTECUBITAL  Final   Special Requests   Final    BOTTLES DRAWN AEROBIC AND ANAEROBIC Blood Culture adequate volume   Culture   Final    NO GROWTH 3 DAYS Performed at [facility], [address], [place], [place] [address]    Report Status PENDING  Incomplete     Labs: BNP (last 3 results) No results for input(s): BNP in the last 8760 hours. Basic Metabolic Panel: Recent Labs  Lab 05/29/19 2303 05/30/19 1113 05/31/19 0156 06/01/19 0328 06/02/19 0202  NA 139 140 138 136 136  K 3.7 3.5 3.4* 3.8 4.1  CL 104 108 105 101 103  CO2 24 22 24 26 24   GLUCOSE 127* 83 93 110* 96  BUN 17 21 23  27* 27*  CREATININE 1.01* 0.98 0.90 0.97 1.10*  CALCIUM 9.3 8.7* 8.7* 8.9 9.1  MG  --   --  1.7  --   --    Liver Function Tests: Recent Labs  Lab 05/29/19 2303  AST 21  ALT 11  ALKPHOS 82  BILITOT 0.9  PROT 7.3  ALBUMIN 4.0   No results for input(s): LIPASE, AMYLASE in the last 168 hours. No results for input(s): AMMONIA in the last 168 hours. CBC: Recent Labs  Lab 05/29/19 2303 05/31/19 0156 06/01/19 0328  WBC 9.5 5.5 6.4  NEUTROABS 8.1*  --   --   HGB 14.3 12.1 12.0  HCT 45.6 37.9 37.8  MCV 96.8 94.3 94.3  PLT 247 183 209   Cardiac Enzymes: No results for input(s): CKTOTAL, CKMB, CKMBINDEX, TROPONINI in the last 168 hours. BNP: Invalid input(s): POCBNP CBG: No results for input(s): GLUCAP in the last 168 hours. D-Dimer No results for input(s): DDIMER in the last 72 hours. Hgb A1c No results for input(s): HGBA1C in the last 72 hours. Lipid Profile No results for input(s): CHOL, HDL, LDLCALC, TRIG, CHOLHDL, LDLDIRECT in the last 72 hours. Thyroid function studies No results for input(s): TSH, T4TOTAL, T3FREE, THYROIDAB in the last 72 hours.  Invalid input(s): FREET3 Anemia work up No results for input(s): VITAMINB12, FOLATE, FERRITIN, TIBC,  IRON, RETICCTPCT in the last 72 hours. Urinalysis    Component Value Date/Time   COLORURINE AMBER (A) 05/30/2019 1320   APPEARANCEUR HAZY (A) 05/30/2019 1320   LABSPEC 1.034 (H) 05/30/2019 1320   PHURINE 5.0 05/30/2019 1320   GLUCOSEU NEGATIVE 05/30/2019 1320   HGBUR NEGATIVE 05/30/2019 1320   BILIRUBINUR NEGATIVE 05/30/2019 1320   KETONESUR 5 (A) 05/30/2019 1320   PROTEINUR 100 (A) 05/30/2019 1320   UROBILINOGEN 0.2 06/27/2009 1600   NITRITE NEGATIVE 05/30/2019 1320   LEUKOCYTESUR NEGATIVE 05/30/2019 1320   Sepsis Labs  Invalid input(s): PROCALCITONIN,  WBC,  LACTICIDVEN Microbiology Recent Results (from the past 240 hour(s))  Blood culture (routine x 2)     Status: None (Preliminary result)   Collection Time: 05/29/19 11:03 PM   Specimen: BLOOD  Result Value Ref Range Status   Specimen Description BLOOD RIGHT ANTECUBITAL  Final   Special Requests   Final    BOTTLES DRAWN AEROBIC AND ANAEROBIC Blood Culture adequate volume   Culture   Final    NO GROWTH 3 DAYS Performed at [facility], [address] [place], [place] [address]    Report Status PENDING  Incomplete  SARS Coronavirus 2 [facility](Hospital order, Performed in [facility] hospital lab) Nasopharyngeal Nasopharyngeal Swab     Status: None   Collection Time: 05/29/19 11:03 PM   Specimen: Nasopharyngeal Swab  Result Value Ref Range Status   SARS Coronavirus 2 NEGATIVE NEGATIVE Final    Comment: (NOTE) If result is NEGATIVE SARS-CoV-2 target nucleic acids are NOT DETECTED. The SARS-CoV-2 RNA is generally detectable in upper and lower  respiratory specimens during the acute phase of infection. The lowest  concentration of SARS-CoV-2 viral copies this assay can detect is 250  copies / mL. A negative result does not preclude SARS-CoV-2 infection  and should not be used as the sole basis for treatment or other  patient management decisions.  A negative result may occur with  improper specimen collection / handling,  submission of specimen other  than nasopharyngeal swab, presence of viral mutation(s) within the  areas targeted by this assay, and inadequate number of viral copies  (<250 copies / mL). A negative result must be combined with clinical  observations, patient history, and epidemiological information. If result is POSITIVE SARS-CoV-2 target nucleic acids are DETECTED. The SARS-CoV-2 RNA is generally detectable in upper and lower  respiratory specimens dur ing the acute phase of infection.  Positive  results are indicative of active infection with SARS-CoV-2.  Clinical  correlation with patient history and other diagnostic information is  necessary to determine patient infection status.  Positive results do  not rule out bacterial infection or co-infection with other viruses. If result is PRESUMPTIVE POSTIVE SARS-CoV-2 nucleic acids MAY BE PRESENT.   A presumptive positive result was obtained on the submitted specimen  and confirmed on repeat testing.  While 2019 novel coronavirus  (SARS-CoV-2) nucleic acids may be present in the submitted sample  additional confirmatory testing may be necessary for epidemiological  and / or clinical management purposes  to differentiate between  SARS-CoV-2 and other Sarbecovirus currently known to infect humans.  If clinically indicated additional testing with an alternate test  methodology [phone]([id number]) is advised. The SARS-CoV-2 RNA is generally  detectable in upper and lower respiratory sp ecimens during the acute  phase of infection. The expected result is Negative. Fact Sheet for Patients:  BoilerBrush.com.cyhttps://www.fda.gov/media/136312/download Fact Sheet for Healthcare Providers: https://pope.com/https://www.fda.gov/media/136313/download This test is not yet approved or cleared by the [place] FDA and has been authorized for detection and/or diagnosis of SARS-CoV-2 by FDA under an Emergency Use Authorization (EUA).  This EUA will remain in effect (meaning this test can be  used) for the duration of the COVID-19 declaration under Section 564(b)(1) of the Act, 21 U.S.C. section 360bbb-3(b)(1), unless the authorization is terminated or revoked sooner. Performed at [facility], [address] [place], [place] [address]   Blood culture (routine x 2)     Status: None (Preliminary result)   Collection Time: 05/30/19  11:13 AM   Specimen: BLOOD  Result Value Ref Range Status   Specimen Description BLOOD RIGHT ANTECUBITAL  Final   Special Requests   Final    BOTTLES DRAWN AEROBIC AND ANAEROBIC Blood Culture adequate volume   Culture   Final    NO GROWTH 3 DAYS Performed at [facility], [address], [place], [place] [address]    Report Status PENDING  Incomplete     Time coordinating discharge: 35 minutes  SIGNED:   Cordelia Poche, MD [facility] 06/02/2019, 4:11 PM

## 2019-06-02 NOTE — TOC Progression Note (Addendum)
Transition of Care [facility]) - Progression Note    Patient Details  Name: Sherry [name] MRN: [id number] Date of Birth: 1928-11-10  [facility] CM/SW Contact  Zenon Mayo, RN Phone Number: 06/02/2019, 9:19 AM  Clinical Narrative:    From home, patient is a participant of the [facility],  NCM spoke with [facility] [phone].  She states they can transport patient home at discharge, and also if it is later in the day that her daughter can transport her also.  NCM informed her that patient will need HHPT which they will take care of at [facility]. NCM notified Butch Penny with [facility] so that they can cancel services they had with patient prior to her coming to hospital.         Expected Discharge Plan and Services                                                 Social Determinants of Health (SDOH) Interventions    Readmission Risk Interventions No flowsheet data found.

## 2019-06-02 NOTE — Progress Notes (Addendum)
Progress Note  Patient Name: Sherry [name] Date of Encounter: 06/02/2019  Primary Cardiologist: No primary care provider on file.   Subjective   Sitting up in bed eating breakfast.   Inpatient Medications    Scheduled Meds: . apixaban  2.5 mg Oral BID  . atorvastatin  40 mg Oral q1800  . feeding supplement (ENSURE ENLIVE)  237 mL Oral BID BM  . influenza vaccine adjuvanted  0.5 mL Intramuscular Tomorrow-1000  . irbesartan  150 mg Oral Daily  . metoprolol succinate  25 mg Oral Daily  . multivitamin with minerals  1 tablet Oral Daily   Continuous Infusions:  PRN Meds: acetaminophen, ipratropium-albuterol, nitroGLYCERIN, ondansetron (ZOFRAN) IV   Vital Signs    Vitals:   06/01/19 1938 06/02/19 0409 06/02/19 0500 06/02/19 0745  BP: (!) 136/96 (!) 145/79  (!) 143/71  Pulse: 88   60  Resp: 20 16  18   Temp: 97.9 F (36.6 C) 97.6 F (36.4 C)  98.4 F (36.9 C)  TempSrc: Oral Oral  Oral  SpO2: 93%   92%  Weight:   51.5 kg   Height:        Intake/Output Summary (Last 24 hours) at 06/02/2019 0803 Last data filed at 06/02/2019 0700 Gross per 24 hour  Intake 640 ml  Output -  Net 640 ml   Last 3 Weights 06/02/2019 06/01/2019 05/31/2019  Weight (lbs) 113 lb 8.6 oz 112 lb 10.5 oz 109 lb 4.8 oz  Weight (kg) 51.5 kg 51.1 kg 49.578 kg      Telemetry    Long conversion pause yesterday--> SR, PACs - Personally Reviewed  ECG    N/a - Personally Reviewed  Physical Exam  Thin frail older WF GEN: No acute distress.   Neck: No JVD Cardiac: RRR, + systolic murmur, rubs, or gallops.  Respiratory: Clear to auscultation bilaterally. GI: Soft, nontender, non-distended  MS: No edema; No deformity. Neuro:  Nonfocal  Psych: Normal affect   Labs    High Sensitivity Troponin:   Recent Labs  Lab 05/29/19 2303 05/30/19 0548 05/30/19 1113 05/31/19 0821  TROPONINIHS 763* 476* 290* 104*      Chemistry Recent Labs  Lab 05/29/19 2303  05/31/19 0156 06/01/19 0328  06/02/19 0202  NA 139   < > 138 136 136  K 3.7   < > 3.4* 3.8 4.1  CL 104   < > 105 101 103  CO2 24   < > 24 26 24   GLUCOSE 127*   < > 93 110* 96  BUN 17   < > 23 27* 27*  CREATININE 1.01*   < > 0.90 0.97 1.10*  CALCIUM 9.3   < > 8.7* 8.9 9.1  PROT 7.3  --   --   --   --   ALBUMIN 4.0  --   --   --   --   AST 21  --   --   --   --   ALT 11  --   --   --   --   ALKPHOS 82  --   --   --   --   BILITOT 0.9  --   --   --   --   GFRNONAA 50*   < > 57* 52* 45*  GFRAA 58*   < > >60 >60 52*  ANIONGAP 11   < > 9 9 9    < > = values in this interval not displayed.  Hematology Recent Labs  Lab 05/29/19 2303 05/31/19 0156 06/01/19 0328  WBC 9.5 5.5 6.4  RBC 4.71 4.02 4.01  HGB 14.3 12.1 12.0  HCT 45.6 37.9 37.8  MCV 96.8 94.3 94.3  MCH 30.4 30.1 29.9  MCHC 31.4 31.9 31.7  RDW 15.9* 15.7* 15.5  PLT 247 183 209    BNPNo results for input(s): BNP, PROBNP in the last 168 hours.   DDimer No results for input(s): DDIMER in the last 168 hours.   Radiology    Dg Chest Port 1 View  Result Date: 06/01/2019 CLINICAL DATA:  Shortness of breath EXAM: PORTABLE CHEST 1 VIEW COMPARISON:  05/29/2019 FINDINGS: There is hyperinflation of the lungs compatible with COPD. Cardiomegaly. Diffuse interstitial prominence throughout the lungs likely reflects interstitial edema. No effusions or pneumothorax. No acute bony abnormality. IMPRESSION: Stable cardiomegaly, COPD. Interstitial prominence likely reflects interstitial edema. Electronically Signed   By: Charlett Nose M.D.   On: 06/01/2019 08:35    Cardiac Studies   Echo 05/31/19:  1. Left ventricular ejection fraction, by visual estimation, is 25 to 30%. The left ventricle has normal function. Normal left ventricular size. There is severely increased left ventricular hypertrophy. 2. Entire apex, mid and apical inferior wall, mid and apical inferolateral wall, apical anterior and apical septal walls are abnormal. 3. Global right ventricle  has normal systolic function.The right ventricular size is normal. No increase in right ventricular wall thickness. 4. Left atrial size was normal. 5. Right atrial size was normal. 6. The mitral valve is normal in structure. Mild mitral valve regurgitation. No evidence of mitral stenosis. 7. The tricuspid valve is normal in structure. Tricuspid valve regurgitation moderate. 8. The aortic valve is tricuspid Aortic valve regurgitation is mild to moderate by color flow Doppler. Mild to moderate aortic valve sclerosis/calcification without any evidence of aortic stenosis. 9. The pulmonic valve was normal in structure. Pulmonic valve regurgitation is mild by color flow Doppler. 10. Moderately elevated pulmonary artery systolic pressure with PASP estimated at . 11. The inferior vena cava is dilated in size with <50% respiratory variability, suggesting right atrial pressure of 15 mmHg.  Patient Profile     85 y.o. female with history of atrial fibrillation, CAD, diastolic CHF, COPD, tobacco abuse and CVA admitted with nausea, malaise and dyspnea. She was febrile at the time of admission with elevated troponin (Hs troponin peak 763).  Assessment & Plan    1. Elevated Troponin: troponin peaked at 763. Felt to be demand ischemia in the setting of acute illness. No chest pain throughout admission. No ASA given need for OAC. On statin and BB therapy.   2. PAF: on amiodarone gtt briefly with conversion to SR, then back in Afib. Had been placed on amiodarone but developed bradycardia/juntional episodes yesterday afternoon, therefore stopped. Currently maintaining SR. Tachy/brady syndrome. Given her advanced age and frail state, seems most reasonable to focus on rate control if Afib reoccurs.  -- on Eliquis 2.5mg  BID, and Toprol XL 25mg  daily  3.  Acute systolic HF: EF noted above 25-30% with entire apex, inferolateral walls being abnormal. On BB, and ARB. No signs of volume overload.   4.  PNA?: treated with antibiotics per primary team for brief period.   For questions or updates, please contact [facility] Please consult [email or web address] for contact info under   Signed, , NP  06/02/2019, 8:03 AM    Patient seen, examined. Available data reviewed. Agree with findings, assessment, and plan as outlined by 08/02/2019,  NP.  The patient is independently interviewed and examined.  On my exam, elderly woman lying in bed, no acute distress, lungs are clear, JVP is normal, heart is regular rate and rhythm with no murmur gallop, abdomen is soft and nontender, extremities have no edema.  Telemetry is reviewed and shows conversion from atrial fibrillation to normal sinus rhythm yesterday at about 10:30 PM.  She has maintained sinus rhythm since then.  She did have marked junctional bradycardia yesterday after converting from atrial fibrillation.  Amiodarone has been discontinued.  I would not restart amiodarone under any circumstances because of marked bradycardia arrhythmias.  I completely agree that rate control is most appropriate if she reverts to atrial fibrillation and I think it is reasonable to keep her on metoprolol succinate 25 mg daily.  She remains on Eliquis 2.5 mg twice daily.  She appears well compensated with her congestive heart failure.  No other medication changes recommended at this time.  Tonny BollmanMichael Jacqulin Brandenburger, M.D. 06/02/2019 12:01 PM

## 2019-06-02 NOTE — Discharge Instructions (Signed)
Sherry [name],  You were in the hospital with concern for a heart attack. The cardiologists think you did not have a heart attack but did have stress to your heart. Your medications have been adjusted for your heart rate. Please follow-up with your heart doctor.

## 2019-06-02 NOTE — TOC Transition Note (Signed)
Transition of Care [facility]) - CM/SW Discharge Note Marvetta Gibbons RN,BSN Transitions of Care Unit 2C cross coverage- RN Case Manager [phone]   Patient Details  Name: Sherry [name] MRN: [id number] Date of Birth: 22-Dec-1928  [facility]) CM/SW Contact:  Dawayne Patricia, RN Phone Number: 06/02/2019, 4:10 PM   Clinical Narrative:    Pt stable for transition home, Pt to start in [facility], per [place] CSW with [facility] [phone])- pt will have enrollment eval with [facility] tomorrow to set up therapy and such under [facility]. [facility] has been notified that pt will stop [facility] services under them and go under [facility]. Per Marcie Bal they will also assess for DME needs and make arrangements any needs determined. Per Marcie Bal, daughter will provide transport home- Marcie Bal will notify daughter to come to hospital around 5pm for discharge. Bedside RN updated on plan.    Final next level of care: Other (comment)([facility] program) Barriers to Discharge: Barriers Resolved, No Barriers Identified   Patient Goals and CMS Choice Patient states their goals for this hospitalization and ongoing recovery are:: return home with [facility]   Choice offered to / list presented to : NA  Discharge Placement             Home with [facility]          Discharge Plan and Services                  DME Agency: NA       HH Arranged: NA HH Agency: NA        Social Determinants of Health (SDOH) Interventions     Readmission Risk Interventions Readmission Risk Prevention Plan 06/02/2019  Transportation Screening Complete  PCP or Specialist Appt within 5-7 Days Complete  Home Care Screening Complete  Medication Review (RN CM) Complete  Some recent data might be hidden

## 2019-06-04 LAB — CULTURE, BLOOD (ROUTINE X 2)
Culture: NO GROWTH
Culture: NO GROWTH
Special Requests: ADEQUATE
Special Requests: ADEQUATE

## 2019-06-13 ENCOUNTER — Emergency Department (HOSPITAL_COMMUNITY): Payer: Medicare (Managed Care)

## 2019-06-13 ENCOUNTER — Inpatient Hospital Stay (HOSPITAL_COMMUNITY)
Admission: EM | Admit: 2019-06-13 | Discharge: 2019-06-15 | DRG: 309 | Disposition: A | Discharge status: Hospice | Payer: Medicare (Managed Care) | Attending: Internal Medicine | Admitting: Internal Medicine

## 2019-06-13 ENCOUNTER — Other Ambulatory Visit: Payer: Self-pay

## 2019-06-13 ENCOUNTER — Encounter (HOSPITAL_COMMUNITY): Payer: Self-pay

## 2019-06-13 DIAGNOSIS — K5900 Constipation, unspecified: Secondary | ICD-10-CM | POA: Diagnosis present

## 2019-06-13 DIAGNOSIS — Z515 Encounter for palliative care: Secondary | ICD-10-CM | POA: Diagnosis present

## 2019-06-13 DIAGNOSIS — Z20828 Contact with and (suspected) exposure to other viral communicable diseases: Secondary | ICD-10-CM | POA: Diagnosis present

## 2019-06-13 DIAGNOSIS — Z720 Tobacco use: Secondary | ICD-10-CM | POA: Diagnosis not present

## 2019-06-13 DIAGNOSIS — I110 Hypertensive heart disease with heart failure: Secondary | ICD-10-CM | POA: Diagnosis present

## 2019-06-13 DIAGNOSIS — R4182 Altered mental status, unspecified: Secondary | ICD-10-CM | POA: Diagnosis present

## 2019-06-13 DIAGNOSIS — I5181 Takotsubo syndrome: Secondary | ICD-10-CM | POA: Diagnosis not present

## 2019-06-13 DIAGNOSIS — J449 Chronic obstructive pulmonary disease, unspecified: Secondary | ICD-10-CM | POA: Diagnosis present

## 2019-06-13 DIAGNOSIS — F0390 Unspecified dementia without behavioral disturbance: Secondary | ICD-10-CM | POA: Diagnosis present

## 2019-06-13 DIAGNOSIS — Z87440 Personal history of urinary (tract) infections: Secondary | ICD-10-CM

## 2019-06-13 DIAGNOSIS — Z955 Presence of coronary angioplasty implant and graft: Secondary | ICD-10-CM

## 2019-06-13 DIAGNOSIS — I480 Paroxysmal atrial fibrillation: Secondary | ICD-10-CM

## 2019-06-13 DIAGNOSIS — B952 Enterococcus as the cause of diseases classified elsewhere: Secondary | ICD-10-CM | POA: Diagnosis present

## 2019-06-13 DIAGNOSIS — I5032 Chronic diastolic (congestive) heart failure: Secondary | ICD-10-CM

## 2019-06-13 DIAGNOSIS — R296 Repeated falls: Secondary | ICD-10-CM | POA: Diagnosis present

## 2019-06-13 DIAGNOSIS — I495 Sick sinus syndrome: Secondary | ICD-10-CM | POA: Diagnosis present

## 2019-06-13 DIAGNOSIS — R451 Restlessness and agitation: Secondary | ICD-10-CM | POA: Diagnosis present

## 2019-06-13 DIAGNOSIS — R109 Unspecified abdominal pain: Secondary | ICD-10-CM

## 2019-06-13 DIAGNOSIS — I455 Other specified heart block: Secondary | ICD-10-CM | POA: Diagnosis not present

## 2019-06-13 DIAGNOSIS — W1830XA Fall on same level, unspecified, initial encounter: Secondary | ICD-10-CM | POA: Diagnosis present

## 2019-06-13 DIAGNOSIS — Z8673 Personal history of transient ischemic attack (TIA), and cerebral infarction without residual deficits: Secondary | ICD-10-CM

## 2019-06-13 DIAGNOSIS — F17210 Nicotine dependence, cigarettes, uncomplicated: Secondary | ICD-10-CM | POA: Diagnosis present

## 2019-06-13 DIAGNOSIS — I2510 Atherosclerotic heart disease of native coronary artery without angina pectoris: Secondary | ICD-10-CM | POA: Diagnosis present

## 2019-06-13 DIAGNOSIS — E785 Hyperlipidemia, unspecified: Secondary | ICD-10-CM | POA: Diagnosis present

## 2019-06-13 DIAGNOSIS — I5042 Chronic combined systolic (congestive) and diastolic (congestive) heart failure: Secondary | ICD-10-CM | POA: Diagnosis present

## 2019-06-13 DIAGNOSIS — I361 Nonrheumatic tricuspid (valve) insufficiency: Secondary | ICD-10-CM | POA: Diagnosis present

## 2019-06-13 DIAGNOSIS — R079 Chest pain, unspecified: Secondary | ICD-10-CM

## 2019-06-13 DIAGNOSIS — B9689 Other specified bacterial agents as the cause of diseases classified elsewhere: Secondary | ICD-10-CM | POA: Diagnosis not present

## 2019-06-13 DIAGNOSIS — R627 Adult failure to thrive: Secondary | ICD-10-CM | POA: Diagnosis present

## 2019-06-13 DIAGNOSIS — Z1621 Resistance to vancomycin: Secondary | ICD-10-CM | POA: Diagnosis present

## 2019-06-13 DIAGNOSIS — N390 Urinary tract infection, site not specified: Secondary | ICD-10-CM | POA: Diagnosis present

## 2019-06-13 DIAGNOSIS — E7800 Pure hypercholesterolemia, unspecified: Secondary | ICD-10-CM | POA: Diagnosis present

## 2019-06-13 DIAGNOSIS — I4891 Unspecified atrial fibrillation: Secondary | ICD-10-CM | POA: Diagnosis present

## 2019-06-13 DIAGNOSIS — Z8249 Family history of ischemic heart disease and other diseases of the circulatory system: Secondary | ICD-10-CM | POA: Diagnosis not present

## 2019-06-13 DIAGNOSIS — W19XXXA Unspecified fall, initial encounter: Secondary | ICD-10-CM

## 2019-06-13 DIAGNOSIS — Z888 Allergy status to other drugs, medicaments and biological substances status: Secondary | ICD-10-CM

## 2019-06-13 DIAGNOSIS — Z79899 Other long term (current) drug therapy: Secondary | ICD-10-CM

## 2019-06-13 DIAGNOSIS — Z66 Do not resuscitate: Secondary | ICD-10-CM | POA: Diagnosis present

## 2019-06-13 DIAGNOSIS — I482 Chronic atrial fibrillation, unspecified: Secondary | ICD-10-CM | POA: Diagnosis not present

## 2019-06-13 DIAGNOSIS — I5020 Unspecified systolic (congestive) heart failure: Secondary | ICD-10-CM | POA: Diagnosis not present

## 2019-06-13 DIAGNOSIS — Z9181 History of falling: Secondary | ICD-10-CM

## 2019-06-13 DIAGNOSIS — Z7901 Long term (current) use of anticoagulants: Secondary | ICD-10-CM | POA: Diagnosis not present

## 2019-06-13 DIAGNOSIS — R1013 Epigastric pain: Secondary | ICD-10-CM | POA: Diagnosis present

## 2019-06-13 DIAGNOSIS — Z7189 Other specified counseling: Secondary | ICD-10-CM

## 2019-06-13 LAB — CBC WITH DIFFERENTIAL/PLATELET
Abs Immature Granulocytes: 0.03 10*3/uL (ref 0.00–0.07)
Basophils Absolute: 0 10*3/uL (ref 0.0–0.1)
Basophils Relative: 0 %
Eosinophils Absolute: 0 10*3/uL (ref 0.0–0.5)
Eosinophils Relative: 1 %
HCT: 45.2 % (ref 36.0–46.0)
Hemoglobin: 14.1 g/dL (ref 12.0–15.0)
Immature Granulocytes: 0 %
Lymphocytes Relative: 24 %
Lymphs Abs: 1.6 10*3/uL (ref 0.7–4.0)
MCH: 29.7 pg (ref 26.0–34.0)
MCHC: 31.2 g/dL (ref 30.0–36.0)
MCV: 95.4 fL (ref 80.0–100.0)
Monocytes Absolute: 0.4 10*3/uL (ref 0.1–1.0)
Monocytes Relative: 6 %
Neutro Abs: 4.6 10*3/uL (ref 1.7–7.7)
Neutrophils Relative %: 69 %
Platelets: 271 10*3/uL (ref 150–400)
RBC: 4.74 MIL/uL (ref 3.87–5.11)
RDW: 16.2 % — ABNORMAL HIGH (ref 11.5–15.5)
WBC: 6.8 10*3/uL (ref 4.0–10.5)
nRBC: 0 % (ref 0.0–0.2)

## 2019-06-13 LAB — URINALYSIS, ROUTINE W REFLEX MICROSCOPIC
Bacteria, UA: NONE SEEN
Bilirubin Urine: NEGATIVE
Glucose, UA: NEGATIVE mg/dL
Hgb urine dipstick: NEGATIVE
Ketones, ur: NEGATIVE mg/dL
Leukocytes,Ua: NEGATIVE
Nitrite: NEGATIVE
Protein, ur: 100 mg/dL — AB
Specific Gravity, Urine: 1.019 (ref 1.005–1.030)
pH: 6 (ref 5.0–8.0)

## 2019-06-13 LAB — COMPREHENSIVE METABOLIC PANEL
ALT: 24 U/L (ref 0–44)
AST: 27 U/L (ref 15–41)
Albumin: 3.7 g/dL (ref 3.5–5.0)
Alkaline Phosphatase: 83 U/L (ref 38–126)
Anion gap: 11 (ref 5–15)
BUN: 18 mg/dL (ref 8–23)
CO2: 24 mmol/L (ref 22–32)
Calcium: 9.4 mg/dL (ref 8.9–10.3)
Chloride: 104 mmol/L (ref 98–111)
Creatinine, Ser: 1.11 mg/dL — ABNORMAL HIGH (ref 0.44–1.00)
GFR calc Af Amer: 51 mL/min — ABNORMAL LOW (ref >60)
GFR calc non Af Amer: 44 mL/min — ABNORMAL LOW (ref >60)
Glucose, Bld: 98 mg/dL (ref 70–99)
Potassium: 4.2 mmol/L (ref 3.5–5.1)
Sodium: 139 mmol/L (ref 135–145)
Total Bilirubin: 0.9 mg/dL (ref 0.3–1.2)
Total Protein: 7.1 g/dL (ref 6.5–8.1)

## 2019-06-13 LAB — TROPONIN I (HIGH SENSITIVITY)
Troponin I (High Sensitivity): 37 ng/L — ABNORMAL HIGH (ref <18)
Troponin I (High Sensitivity): 34 ng/L — ABNORMAL HIGH (ref <18)

## 2019-06-13 LAB — LACTIC ACID, PLASMA
Lactic Acid, Venous: 2.3 mmol/L (ref 0.5–1.9)
Lactic Acid, Venous: 1.7 mmol/L (ref 0.5–1.9)

## 2019-06-13 LAB — TSH: TSH: 2.509 u[IU]/mL (ref 0.350–4.500)

## 2019-06-13 LAB — MAGNESIUM: Magnesium: 1.9 mg/dL (ref 1.7–2.4)

## 2019-06-13 LAB — LIPASE, BLOOD: Lipase: 21 U/L (ref 11–51)

## 2019-06-13 LAB — SARS CORONAVIRUS 2 (TAT 6-24 HRS): SARS Coronavirus 2: NEGATIVE

## 2019-06-13 LAB — BRAIN NATRIURETIC PEPTIDE: B Natriuretic Peptide: 1426.9 pg/mL — ABNORMAL HIGH (ref 0.0–100.0)

## 2019-06-13 MED ORDER — ATORVASTATIN CALCIUM 40 MG PO TABS
40.0000 mg | ORAL_TABLET | Freq: Every day | ORAL | Status: DC
  Administered 2019-06-14: 40 mg via ORAL
  Filled 2019-06-13 (×2): qty 1

## 2019-06-13 MED ORDER — SODIUM CHLORIDE 0.9 % IV BOLUS
500.0000 mL | Freq: Once | INTRAVENOUS | Status: AC
  Administered 2019-06-13: 08:00:00 500 mL via INTRAVENOUS

## 2019-06-13 MED ORDER — FENTANYL CITRATE (PF) 100 MCG/2ML IJ SOLN
50.0000 ug | Freq: Once | INTRAMUSCULAR | Status: DC
  Filled 2019-06-13: qty 2

## 2019-06-13 MED ORDER — IOHEXOL 350 MG/ML SOLN
80.0000 mL | Freq: Once | INTRAVENOUS | Status: AC | PRN
  Administered 2019-06-13: 80 mL via INTRAVENOUS

## 2019-06-13 MED ORDER — ONDANSETRON HCL 4 MG/2ML IJ SOLN
4.0000 mg | Freq: Once | INTRAMUSCULAR | Status: AC
  Administered 2019-06-13: 11:00:00 4 mg via INTRAVENOUS
  Filled 2019-06-13: qty 2

## 2019-06-13 MED ORDER — HYDROCODONE-ACETAMINOPHEN 5-325 MG PO TABS: 1.0000 | ORAL_TABLET | Freq: Once | ORAL | Status: DC

## 2019-06-13 MED ORDER — METOPROLOL SUCCINATE ER 25 MG PO TB24
25.0000 mg | ORAL_TABLET | Freq: Every day | ORAL | Status: DC
  Administered 2019-06-13: 09:00:00 25 mg via ORAL
  Filled 2019-06-13: qty 1

## 2019-06-13 MED ORDER — DILTIAZEM HCL-DEXTROSE 125-5 MG/125ML-% IV SOLN (PREMIX)
5.0000 mg/h | INTRAVENOUS | Status: DC
  Filled 2019-06-13 (×2): qty 125

## 2019-06-13 MED ORDER — IRBESARTAN 75 MG PO TABS
75.0000 mg | ORAL_TABLET | Freq: Every day | ORAL | Status: DC
  Administered 2019-06-13 – 2019-06-14 (×2): 75 mg via ORAL
  Filled 2019-06-13 (×2): qty 1

## 2019-06-13 MED ORDER — ONDANSETRON HCL 4 MG/2ML IJ SOLN: 4.0000 mg | Freq: Four times a day (QID) | INTRAMUSCULAR | Status: DC | PRN

## 2019-06-13 MED ORDER — APIXABAN 2.5 MG PO TABS: 2.5000 mg | ORAL_TABLET | Freq: Two times a day (BID) | ORAL | Status: DC

## 2019-06-13 MED ORDER — FAMOTIDINE IN NACL 20-0.9 MG/50ML-% IV SOLN
20.0000 mg | Freq: Once | INTRAVENOUS | Status: AC
  Administered 2019-06-13: 11:00:00 20 mg via INTRAVENOUS
  Filled 2019-06-13: qty 50

## 2019-06-13 MED ORDER — SENNOSIDES-DOCUSATE SODIUM 8.6-50 MG PO TABS: 1.0000 | ORAL_TABLET | Freq: Every evening | ORAL | Status: DC | PRN

## 2019-06-13 MED ORDER — SENNOSIDES-DOCUSATE SODIUM 8.6-50 MG PO TABS
1.0000 | ORAL_TABLET | Freq: Two times a day (BID) | ORAL | Status: DC
  Administered 2019-06-14 (×2): 1 via ORAL
  Filled 2019-06-13 (×4): qty 1

## 2019-06-13 MED ORDER — METOPROLOL SUCCINATE ER 25 MG PO TB24: 12.5000 mg | ORAL_TABLET | Freq: Every day | ORAL | Status: DC

## 2019-06-13 MED ORDER — METOPROLOL TARTRATE 5 MG/5ML IV SOLN
2.5000 mg | Freq: Once | INTRAVENOUS | Status: AC
  Administered 2019-06-13: 08:00:00 2.5 mg via INTRAVENOUS
  Filled 2019-06-13: qty 5

## 2019-06-13 MED ORDER — IPRATROPIUM-ALBUTEROL 0.5-2.5 (3) MG/3ML IN SOLN: 3.0000 mL | Freq: Four times a day (QID) | RESPIRATORY_TRACT | Status: DC | PRN

## 2019-06-13 MED ORDER — NICOTINE 7 MG/24HR TD PT24
7.0000 mg | MEDICATED_PATCH | Freq: Every day | TRANSDERMAL | Status: DC
  Administered 2019-06-13: 22:00:00 7 mg via TRANSDERMAL
  Filled 2019-06-13: qty 1

## 2019-06-13 MED ORDER — FUROSEMIDE 10 MG/ML IJ SOLN
40.0000 mg | Freq: Once | INTRAMUSCULAR | Status: AC
  Administered 2019-06-13: 15:00:00 40 mg via INTRAVENOUS
  Filled 2019-06-13: qty 4

## 2019-06-13 NOTE — ED Notes (Signed)
Lactic reported to PA

## 2019-06-13 NOTE — ED Notes (Signed)
[place] team at bedside

## 2019-06-13 NOTE — Consult Note (Addendum)
Cardiology Consultation:   Patient ID: Sherry [name] MRN: [id number]; DOB: [birth year]  Admit date: 06/13/2019 Date of Consult: 06/13/2019  Primary Care Provider: Darrow BussingKoirala, Dibas, MD Primary Cardiologist: Tonny BollmanMichael Cooper, MD - has only seen her in the hospital Primary Electrophysiologist:  None    Patient Profile:   Sherry [name] is a 85 y.o. 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 Donnald GarrePfeiffer.  History of Present Illness:   Sherry [name] was admitted 03/05/2019 and noted to have increased frequency of falls, and A. fib with RVR.  She was treated with a diltiazem drip.  Suspected to have UTI though culture unrevealing, treated empirically.  There was also concern for pneumonia also treated empirically.  She was noted to have pauses greater than 5 seconds on oral diltiazem.  She was seen by EP, Dr. Ladona Ridgelaylor who felt that her pauses were not significant and she was continued on Cardizem 180 mg daily.  She was noted to have severe biatrial enlargement on echo, making long-term rhythm control challenging.  She was considered a poor pacemaker candidate given her combination of dementia and failure to thrive.  She was started on Eliquis during this hospitalization.  She was again admitted to the hospital in late September with febrile illness.  She had elevated troponin, no chest pain.  Echo showed apical wall motion abnormality consistent with Takotsubo cardiomyopathy, EF 25-30%.  She required IV amiodarone briefly for A. fib.  Amiodarone was stopped in the setting of bradycardia/junctional episodes.  Given her advanced age and frail state it was decided to focus on rate control for A. fib.  Diltiazem was stopped given new LV dysfunction and patient was started on Toprol 50 mg daily.  She was continued on irbesartan.  She  was continued on Eliquis (reduced dose for age and weight)  The patient returned to the hospital emergency room today with altered mental status after a fall and atrial fibrillation with RVR.  The patient apparently had a ground-level fall in her bedroom around 4:30 in the morning.  She reported that she thinks she did hit her head but did not lose consciousness.  Family reported that patient was more confused after the fall.  She has also been complaining of abdominal pain and feels like she needs to vomit.  The daughter reported that vomiting was an occasional thing for her but the abdominal pain was new.  The patient was given Lopressor 2.5 mg IV followed by her Toprol-XL 25 mg daily. She then converted intermittently to sinus rhythm in the 70's with several 4-9 second pauses. With the longest pause the nurse says she was in the room and it appeared that the pt lost consciousness very briefly. Now back in afib in the 110's.   On my exam the patient rouses up but has trouble answering many questions. When asked where she is she states "in my trailer". She has no recollection of the fall this morning. She knows that she got here in an ambulance. She says no to chest pain and shortness of breath- "not now". She is unable to tell me if she has had any recent chest pain or shortness of breath. She is lying almost flat without breathing difficulty, however she has JVD and crackles in her lung bases.   She denies any abdominal tenderness to me. No tenderness  on palpation.   Lactic acid 2.3. She has 6-10 WBCs in urine.   Heart Pathway Score:     Past Medical History:  Diagnosis Date   Atrial fibrillation (HCC)    CAD (coronary artery disease)    Chronic diastolic CHF (congestive heart failure) (HCC)    COPD (chronic obstructive pulmonary disease) (HCC)    Hypercholesteremia    Hypertension    Ischemic stroke (HCC)    Moderate tricuspid regurgitation     Past Surgical History:  Procedure  Laterality Date   CORONARY ANGIOPLASTY WITH STENT PLACEMENT     HIP SURGERY       Home Medications:  Prior to Admission medications   Medication Sig Start Date End Date Taking? Authorizing Provider  acetaminophen (TYLENOL) 500 MG tablet Take 2 tablets (1,000 mg total) by mouth every 8 (eight) hours as needed for mild pain, fever or headache. 10/13/17   Geradine Girt, DO  apixaban (ELIQUIS) 2.5 MG TABS tablet Take 1 tablet (2.5 mg total) by mouth 2 (two) times daily. 03/15/19   Samuella Cota, MD  atorvastatin (LIPITOR) 40 MG tablet Take 40 mg by mouth daily.  12/22/16   [provider]  escitalopram (LEXAPRO) 5 MG tablet Take 5 mg by mouth daily. 05/18/19   [provider]  ferrous sulfate 325 (65 FE) MG tablet Take 1 tablet (325 mg total) by mouth daily with breakfast. 03/16/19   Samuella Cota, MD  hydrOXYzine (ATARAX/VISTARIL) 50 MG tablet Take 50 mg by mouth daily.  01/20/19   [provider]  Ipratropium-Albuterol (COMBIVENT) 20-100 MCG/ACT AERS respimat Inhale 1 puff into the lungs every 6 (six) hours as needed for wheezing or shortness of breath.    [provider]  ipratropium-albuterol (DUONEB) 0.5-2.5 (3) MG/3ML SOLN Take 3 mLs by nebulization every 4 (four) hours as needed (sob/wheezing). 10/13/17   Geradine Girt, DO  metoprolol succinate (TOPROL-XL) 25 MG 24 hr tablet Take 1 tablet (25 mg total) by mouth daily. 06/03/19   Mariel Aloe, MD  telmisartan (MICARDIS) 40 MG tablet Take 40 mg by mouth daily. 12/31/18   [provider]    Inpatient Medications: Scheduled Meds:  fentaNYL (SUBLIMAZE) injection  50 mcg Intravenous Once   Continuous Infusions:  diltiazem (CARDIZEM) infusion     PRN Meds:   Allergies:    Allergies  Allergen Reactions   Amiodarone     Social History:   Social History   Socioeconomic History   Marital status: Widowed    Spouse name: Not on file   Number of children: Not on file   Years  of education: Not on file   Highest education level: Not on file  Occupational History   Not on file  Social Needs   Financial resource strain: Not on file   Food insecurity    Worry: Not on file    Inability: Not on file   Transportation needs    Medical: Not on file    Non-medical: Not on file  Tobacco Use   Smoking status: Current Every Day Smoker    Packs/day: 1.00    Types: Cigarettes   Smokeless tobacco: Never Used  Substance and Sexual Activity   Alcohol use: No    Frequency: Never   Drug use: No   Sexual activity: Not on file  Lifestyle   Physical activity    Days per week: Not on file    Minutes per session: Not on file   Stress:  Not on file  Relationships   Social connections    Talks on phone: Not on file    Gets together: Not on file    Attends religious service: Not on file    Active member of club or organization: Not on file    Attends meetings of clubs or organizations: Not on file    Relationship status: Not on file   Intimate partner violence    Fear of current or ex partner: Not on file    Emotionally abused: Not on file    Physically abused: Not on file    Forced sexual activity: Not on file  Other Topics Concern   Not on file  Social History Narrative   Not on file    Family History:    Family History  Problem Relation Age of Onset   Heart attack Mother      ROS:  Please see the history of present illness.   All other ROS reviewed and negative.     Physical Exam/Data:   Vitals:   06/13/19 1115 06/13/19 1130 06/13/19 1145 06/13/19 1231  BP: (!) 170/151 (!) 145/100 (!) 139/115 (!) 140/103  Pulse: 69 (!) 143  (!) 111  Resp: (!) 23 (!) Temp:      TempSrc:      SpO2: 92% 94%  99%  Weight:      Height:       No intake or output data in the 24 hours ending 06/13/19 1338 Last 3 Weights 06/13/2019 06/02/2019 06/01/2019  Weight (lbs) 110 lb 113 lb 8.6 oz 112 lb 10.5 oz  Weight (kg) 49.896 kg 51.5 kg 51.1 kg       Body mass index is 17.75 kg/m.  General:  Frail elderly female HEENT: normal Lymph: no adenopathy Neck: + JVD Endocrine:  No thryomegaly Vascular: No carotid bruits; FA pulses 2+ bilaterally without bruits  Cardiac:  normal S1, S2; Irregularly irregular rhythm Lungs:  Lungs clear except few crackles in the bases  Abd: soft, nontender, no hepatomegaly  Ext: no edema Musculoskeletal:  No deformities, BUE and BLE strength normal and equal Skin: warm and dry, fingers yellowed from smoking Neuro:  no focal abnormalities noted except poor memory Psych:  Normal affect   EKG:  The EKG was personally reviewed and demonstrates:   06:35  Afib 119 bpm 12:38  Sinus rhythm, 90 bpm, freq PACs, Probable left atrial enlargement, LVH with secondary repolarization abnormality, Anterior Q waves, possibly due to LVH Telemetry:  Telemetry was personally reviewed and demonstrates:  SR in the 110's. She has had a Couple of brief conversions to sinus rhythm in the 70's but had several pauses between 4 and 9 seconds  Relevant CV Studies:  Echocardiogram 05/30/2019 IMPRESSIONS  1. Left ventricular ejection fraction, by visual estimation, is 25 to 30%. The left ventricle has normal function. Normal left ventricular size. There is severely increased left ventricular hypertrophy.  2. Entire apex, mid and apical inferior wall, mid and apical inferolateral wall, apical anterior and apical septal walls are abnormal.  3. Global right ventricle has normal systolic function.The right ventricular size is normal. No increase in right ventricular wall thickness.  4. Left atrial size was normal.  5. Right atrial size was normal.  6. The mitral valve is normal in structure. Mild mitral valve regurgitation. No evidence of mitral stenosis.  7. The tricuspid valve is normal in structure. Tricuspid valve regurgitation moderate.  8. The aortic valve is tricuspid Aortic  valve regurgitation is mild to moderate by color flow  Doppler. Mild to moderate aortic valve sclerosis/calcification without any evidence of aortic stenosis.  9. The pulmonic valve was normal in structure. Pulmonic valve regurgitation is mild by color flow Doppler. 10. Moderately elevated pulmonary artery systolic pressure with PASP estimated at . 11. The inferior vena cava is dilated in size with <50% respiratory variability, suggesting right atrial pressure of 15 mmHg.   Echo 03/06/2019: EF 60-65%  Laboratory Data:  High Sensitivity Troponin:   Recent Labs  Lab 05/29/19 2303 05/30/19 0548 05/30/19 1113 05/31/19 0821 06/13/19 1113  TROPONINIHS 763* 476* 290* 104* 37*     Chemistry Recent Labs  Lab 06/13/19 0647  NA 139  K 4.2  CL 104  CO2 24  GLUCOSE 98  BUN 18  CREATININE 1.11*  CALCIUM 9.4  GFRNONAA 44*  GFRAA 51*  ANIONGAP 11    Recent Labs  Lab 06/13/19 0647  PROT 7.1  ALBUMIN 3.7  AST 27  ALT 24  ALKPHOS 83  BILITOT 0.9   Hematology Recent Labs  Lab 06/13/19 0647  WBC 6.8  RBC 4.74  HGB 14.1  HCT 45.2  MCV 95.4  MCH 29.7  MCHC 31.2  RDW 16.2*  PLT 271   BNP Recent Labs  Lab 06/13/19 1114  BNP 1,426.9*    DDimer No results for input(s): DDIMER in the last 168 hours.   Radiology/Studies:  Dg Chest 2 View  Result Date: 06/13/2019 CLINICAL DATA:  Status post fall. EXAM: CHEST - 2 VIEW COMPARISON:  June 01, 2019. FINDINGS: Stable cardiomegaly. No pneumothorax is noted. Atherosclerosis of thoracic aorta is noted. Mild bibasilar subsegmental atelectasis or edema are noted. No significant pleural effusion is noted. Bony thorax is unremarkable. IMPRESSION: Mild bibasilar atelectasis or edema is noted. Aortic Atherosclerosis (ICD10-I70.0). Electronically Signed   By: Lupita Raider M.D.   On: 06/13/2019 07:42   Dg Lumbar Spine Complete  Result Date: 06/13/2019 CLINICAL DATA:  Low back pain after fall. EXAM: LUMBAR SPINE - COMPLETE 4+ VIEW COMPARISON:  September 08, 2017. FINDINGS: Stable  old T12 compression fracture is noted. No acute fracture or spondylolisthesis is noted. Severe degenerative disc disease is noted at L2-3 with anterior osteophyte formation. Mild degenerative disc disease is also noted at L1-2, L3-4 and L4-5 with anterior osteophyte formation. Diffuse osteopenia is noted. Calcified abdominal aortic aneurysm is noted. IMPRESSION: Multilevel degenerative disc disease is noted. Old T12 fracture is noted. No acute fracture or spondylolisthesis is noted. Calcified abdominal aortic aneurysm is noted. Ultrasound is recommended for further evaluation. Electronically Signed   By: Lupita Raider M.D.   On: 06/13/2019 07:44   Ct Head Wo Contrast  Result Date: 06/13/2019 CLINICAL DATA:  Fall with right temporal headache. EXAM: CT HEAD WITHOUT CONTRAST CT CERVICAL SPINE WITHOUT CONTRAST TECHNIQUE: Multidetector CT imaging of the head and cervical spine was performed following the standard protocol without intravenous contrast. Multiplanar CT image reconstructions of the cervical spine were also generated. COMPARISON:  03/05/2019 FINDINGS: CT HEAD FINDINGS Brain: No evidence of acute infarction, hemorrhage, hydrocephalus, extra-axial collection or mass lesion/mass effect. Moderate remote left occipital infarct. Confluent microvascular ischemic low-density in the cerebral white matter. Ventriculomegaly primarily attributed to volume loss. Vascular: No hyperdense vessel or unexpected calcification. Skull: Negative for fracture Sinuses/Orbits: Bilateral cataract resection. No evidence of injury. CT CERVICAL SPINE FINDINGS Alignment: No traumatic malalignment. Skull base and vertebrae: No acute fracture.  C3 and C4 laminectomy. Soft tissues and spinal canal:  No prevertebral fluid or swelling. No visible canal hematoma. Disc levels: Generalized disc narrowing with moderate lower cervical endplate ridging. Multilevel facet spurring bulkier on the right. Upper chest: Layering pleural effusions,  small where visualized period. Atherosclerosis. IMPRESSION: 1. No evidence of acute intracranial or cervical spine injury. 2. Atrophy and advanced chronic small vessel ischemia. Remote left occipital infarct. Electronically Signed   By: Marnee Spring M.D.   On: 06/13/2019 08:00   Ct Cervical Spine Wo Contrast  Result Date: 06/13/2019 CLINICAL DATA:  Fall with right temporal headache. EXAM: CT HEAD WITHOUT CONTRAST CT CERVICAL SPINE WITHOUT CONTRAST TECHNIQUE: Multidetector CT imaging of the head and cervical spine was performed following the standard protocol without intravenous contrast. Multiplanar CT image reconstructions of the cervical spine were also generated. COMPARISON:  03/05/2019 FINDINGS: CT HEAD FINDINGS Brain: No evidence of acute infarction, hemorrhage, hydrocephalus, extra-axial collection or mass lesion/mass effect. Moderate remote left occipital infarct. Confluent microvascular ischemic low-density in the cerebral white matter. Ventriculomegaly primarily attributed to volume loss. Vascular: No hyperdense vessel or unexpected calcification. Skull: Negative for fracture Sinuses/Orbits: Bilateral cataract resection. No evidence of injury. CT CERVICAL SPINE FINDINGS Alignment: No traumatic malalignment. Skull base and vertebrae: No acute fracture.  C3 and C4 laminectomy. Soft tissues and spinal canal: No prevertebral fluid or swelling. No visible canal hematoma. Disc levels: Generalized disc narrowing with moderate lower cervical endplate ridging. Multilevel facet spurring bulkier on the right. Upper chest: Layering pleural effusions, small where visualized period. Atherosclerosis. IMPRESSION: 1. No evidence of acute intracranial or cervical spine injury. 2. Atrophy and advanced chronic small vessel ischemia. Remote left occipital infarct. Electronically Signed   By: Marnee Spring M.D.   On: 06/13/2019 08:00   Ct Angio Chest/abd/pel For Dissection W And/or Wo Contrast  Result Date:  06/13/2019 CLINICAL DATA:  Acute chest pain, back pain EXAM: CT ANGIOGRAPHY CHEST, ABDOMEN AND PELVIS TECHNIQUE: Multidetector CT imaging through the chest, abdomen and pelvis was performed using the standard protocol during bolus administration of intravenous contrast. Multiplanar reconstructed images and MIPs were obtained and reviewed to evaluate the vascular anatomy. CONTRAST:  80mL OMNIPAQUE IOHEXOL 350 MG/ML SOLN COMPARISON:  CT chest 10/12/2017 FINDINGS: Technical note: Examination quality is significantly degraded by extensive beam hardening and streak artifact from patient's arms with multiple metallic rings overlying the mid abdomen. Additional streak artifact from left hip arthroplasty hardware. Respiratory motion artifact degrades evaluation of the chest. CTA CHEST FINDINGS Cardiovascular: The thoracic aorta is well opacified. Borderline enlargement of the ascending thoracic aorta, unchanged from prior. No thoracic aortic dissection. There is extensive calcified and noncalcified atherosclerotic plaque throughout the aorta. Prominent coronary artery calcifications. Pulmonary vasculature is also well opacified without central filling defect. Heart size is mildly enlarged without pericardial effusion. Mediastinum/Nodes: Negative for axillary, mediastinal, or hilar lymphadenopathy. No discrete thyroid nodule. Trachea unremarkable. Esophagus grossly unremarkable. Lungs/Pleura: Small bilateral pleural effusions with associated compressive atelectasis. Mild centrilobular emphysema with upper lobe predominance. Scattered calcified granulomas. No focal airspace consolidation. No pneumothorax. Musculoskeletal: Chronic superior endplate compression deformity of T12. Multilevel degenerative changes of the thoracic spine. No acute osseous finding. Review of the MIP images confirms the above findings. CTA ABDOMEN AND PELVIS FINDINGS VASCULAR Aorta: Ectasia of the proximal abdominal aorta measuring 2.8 cm in diameter.  Aorta is tortuous. Infrarenal abdominal aorta measuring 2.8 cm in diameter. Extensive calcified and noncalcified atherosclerotic plaque throughout the aorta. No dissection is evident within the limitations of this exam. Celiac: Patent without high-grade stenosis. SMA: Focal calcified plaque  at the origin of the SMA results in less than 50% stenosis. SMA is otherwise well opacified. Renals: Bilateral renal arteries patent without high-grade stenosis. IMA: IMA not well-visualized. Inflow: Prior left common iliac artery and right external iliac artery stents. 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 Extensive atherosclerotic plaques. Veins: Poorly evaluated. Review of the MIP images confirms the above findings. NON-VASCULAR Hepatobiliary: Reflux of contrast within the hepatic veins. No discrete hepatic abnormality. Gallbladder grossly unremarkable. Pancreas: Poorly evaluated.  No gross abnormality. Spleen: Scattered calcified granulomas.  Otherwise unremarkable. Adrenals/Urinary Tract: Grossly unremarkable adrenals. No hydronephrosis. Poorly evaluated rounded 2.3 cm region within the midpole of the right kidney. No hydronephrosis. Left kidney grossly unremarkable. No obvious bladder abnormality. Stomach/Bowel: Stomach and bowel grossly unremarkable. No dilated loops of bowel. Mild rectosigmoid colonic stool volume. Lymphatic: No obvious enlarged abdominopelvic lymph nodes. Reproductive: 3.0 cm rounded lesion within the left adnexa (series 7, image 230), poorly characterized. Uterus grossly unremarkable. Other: No ascites.  No abdominal wall hernia. Musculoskeletal: Prior left hip ORIF. Degenerative changes of the lumbar spine. No fractures. Review of the MIP images confirms the above findings. IMPRESSION: 1. Significantly limited exam.  See above discussion. 2. No evidence of aortic dissection within the limitations of this exam. 3. Ectasia of the thoracic and abdominal aorta at risk for aneurysm  development. Recommend followup by abdominal ultrasound in 5 years. This recommendation follows ACR consensus guidelines: White Paper of the ACR Incidental Findings Committee II on Vascular Findings. J Am Coll Radiol 2013; 10:789-794. Aortic aneurysm NOS (ICD10-I71.9) 4. Advanced atherosclerotic disease. 5. Small bilateral pleural effusions. 6. Poorly evaluated low-density 2.3 cm lesion within the right kidney. Nonemergent renal ultrasound can be performed to further evaluate. Electronically Signed   By: Duanne Guess M.D.   On: 06/13/2019 12:47    Assessment and Plan:   Paroxysmal atrial fibrillation/Tachy-brady syndrome -Patient first noted to have A. fib in July in setting of probable UTI and/or pneumonia.  She was noted to have some low heart rates and brief pauses but asymptomatic.  She was placed on diltiazem.  She had another hospitalization in late September with A. fib.  She was felt to have tachybradycardia syndrome.  She also had stress cardiomyopathy with EF down to 25-30%.  Diltiazem was switched to Toprol-XL.  She required a brief treatment with amiodarone but it caused junctional rhythm. -Pt not felt to be a pacemaker candidate due to advanced age and frailty. -Patient presents back to the hospital after a fall, and A. fib with RVR. -CHA2DS2/VAS Stroke Risk score is high at 8 (CHF, Vasc dz, HTN, Age (2), CVA (2), female). On Eliquis for stroke risk reduction, reduced dose for weight and age. In setting of frequent falls, may consider revisiting use of anticoagulation.  Currently her stroke risk seems to outweigh the risk of anticoagulation.  -This is a difficult case, having tolerated diltiazem and amiodarone poorly leading to pauses and bradycardia on previous admissions. Now with 4-9 second pauses with just metoprolol.  -Will check thyroid function. Check mag level.  -Pt was deemed not to be a PPM candidate during prior admission, advanced age, frailty and was asymptomatic at the  time. She is now symptomatic with afib RVR and symptomatic pauses. Will have EP see her again.    Takotsubo stress cardiomyopathy -EF down to 25-30% with apical wall motion abnormality noted on echo 05/30/2019. -BNP is 1426.9.  Troponin is 37 which is down from 763 two weeks ago. -Chest x-ray with mild  bibasilar atelectasis or edema -Patient is on Toprol-XL 25 mg daily and telmisartan 40 mg daily -Pt is not having dyspnea or orthopea, but has JVD and crackles in lung bases. Will give 1 dose of IV lasix.   Frequent falls -Patient presented with a fall today.  Reportedly possibly hitting her head.  CT of the head without acute abnormality.  Hyperlipidemia -On atorvastatin 40 mg daily  Hypertension -On Toprol-XL 25 mg daily and telmisartan 40 mg daily -BP has been elevated significantly, but latest reading on monitor is 138/86. She has not had her ARB yet today.  -Will resume home ARB at half dose.   Tobacco use -Pt with yellowed fingers from smoking. Admits to smoking 3 cigarettes per day. At her advanced age, not sure if the benefit from cessation is worth her discomfort.   Pt is a DNR- No Chest compressions, No intubation     For questions or updates, please contact [facility] Please consult [email or web address] for contact info under    Signed, Berton Bon, NP  06/13/2019 1:38 PM   Patient seen and examined. Agree with assessment and plan.  Sherry [name] is an 85 year old female who has a history of hypertension, hyperlipidemia, COPD, as well as a history of paroxysmal atrial fibrillation and remote CVA.  She was hospitalized in July after experiencing increased frequency of falls in the setting of AF with RVR.  She initially treated with Cardizem and had pauses on therapy.  She was evaluated by Dr. Ladona Ridgel felt she was a poor pacemaker candidate as result of mental status and at that time he did not feel she was significantly symptomatic with her probable sinus node dysfunction.   She was subsequently readmitted in late September and echo at that time now showed an EF of 25 to 30% felt to be secondary to Tacotsubu cardiomyopathy and due to AF was given a trial of IV amiodarone was stopped secondary to bradycardia and junctional rhythm.  She was started on ARB therapy and reduced dose Eliquis.  She represented to the hospital today with altered mental status after a fall and was found to be in A. fib with RVR with heart rates in the 120s.  Presently, her mental status has improved.  She had been on Toprol-XL 25 mg which was started in place of diltiazem after she was found to have cardiomyopathy.  With her increased jugular rate in the setting of AF today she was given 1 dose of 2.5 mg of IV metoprolol.  This resulted in significant pauses of at least 9 seconds which the patient was very symptomatic with ultimate post pause showing sinus rhythm.  She has subsequently reverted back to A. fib with current heart rates now in the 1 teens to 120s.  She denies any chest pain.  JVD is increased.  She has decreased breath sounds at her bases.  Her rhythm is irregularly irregular with rate approximately 115 to 120 bpm.  There is a 6 apical systolic murmur.  There is mild hepatojugular reflux.  Bowel sounds are positive.  There is no significant lower extremity edema.  Her ECG shows sinus rhythm in the 90s with marked sinus arrhythmia, PACs, PVCs, and inferolateral T wave inversion.  Her blood pressure has been labile and ranged from 1 40-200 systolically.  At present, will reinstitute low-dose ARB therapy and will change her telmisartan to irbesartan 75 mg daily.  She is not a candidate for amiodarone.  With her tachybradycardia syndrome with prolonged symptomatic pauses  we will ask for EP reevaluation for reconsideration of possible repeat discussion concerning permanent pacemaker treatment.  We will recheck thyroid function studies, magnesium level.  BNP is increased.  Will give a dose of furosemide  40 mg IV x1.  At present, will resume with her low dose at Toprol-XL 12.5 mg instead of her 25 mg previous dose and not increase due to prolonged pauses and sinus node recovery time.  We have contacted our EP service.  Will currently hold Eliquis anticoagulation in the event a pacemaker is to be placed.  However, she is at significantly increased fall risk and may not be a candidate for long-term static anticoagulation spite her increased cha2ds2vas score.  Lennette Bihari, MD, [facility] 06/13/2019 3:06 PM

## 2019-06-13 NOTE — ED Provider Notes (Signed)
[facility] EMERGENCY DEPARTMENT Provider Note   CSN: [id number] Arrival date & time: 06/13/19  4696     History   Chief Complaint Chief Complaint  Patient presents with   Altered Mental Status   Fall   Atrial Fibrillation    HPI Sherry [name] is a 85 y.o. female.     Sherry [name] is a 85 y.o. female with a history of A. fib, stroke, hypertension, hyperlipidemia, CAD, CHF and COPD, who presents to the emergency department via EMS for evaluation after a ground-level fall in her bedroom around 4:30 this morning.  Since the fall she reports she has been experiencing pain over the right side of her head and does think that she hit her head.  Patient does not think that she lost consciousness.  Last seen normal around 9 PM last night before she went to bed.  Patient lives with her daughter, but daughter works night shift and a friend was staying with the patient last night.  Fall was unwitnessed.  Family member reported that patient seemed more confused after the fall.  Daughter reports that at baseline patient is oriented to self and knows that she lives with her daughter but is not always oriented to place or situation.  She is on blood thinners due to her A. fib, recent admission for stroke and A. fib 2 months ago and also had recent admission for pneumonia, negative Cova test during this admission.  Patient reports that she does think she hit her head, has a mild headache but denies any numbness weakness or tingling.  She denies any nausea or vomiting.  Denies having any chest pain or shortness of breath.  Denies sensation of palpitations.  She reports that initially after the fall she had some mild pain in her neck and low back although this seems to improve per patient.  No lacerations or abrasions from fall.  Patient denies any fevers or recent illness.  Denies dysuria or urinary frequency.  Patient reports she has not had any of her medications yet today, but has  otherwise been taking them regularly.  Level 5 caveat: Altered mental status     Past Medical History:  Diagnosis Date   Atrial fibrillation (HCC)    CAD (coronary artery disease)    Chronic diastolic CHF (congestive heart failure) (HCC)    COPD (chronic obstructive pulmonary disease) (HCC)    Hypercholesteremia    Hypertension    Ischemic stroke (HCC)    Moderate tricuspid regurgitation     Patient Active Problem List   Diagnosis Date Noted   Elevated troponin 05/30/2019   AF (paroxysmal atrial fibrillation) (HCC) 05/30/2019   Mild renal insufficiency 05/30/2019   History of CVA (cerebrovascular accident) 05/30/2019   Hypertensive urgency 05/30/2019   COPD (chronic obstructive pulmonary disease) (HCC) 05/30/2019   Acute CVA (cerebrovascular accident) (HCC) 03/05/2019   Pneumonia 03/05/2019   Hypoglycemia 03/05/2019   Fever 10/11/2017   Hypokalemia 10/11/2017   Tachycardia 10/11/2017   Atrial fibrillation with rapid ventricular response (HCC) 10/11/2017    Past Surgical History:  Procedure Laterality Date   CORONARY ANGIOPLASTY WITH STENT PLACEMENT     HIP SURGERY       OB History   No obstetric history on file.      Home Medications    Prior to Admission medications   Medication Sig Start Date End Date Taking? Authorizing Provider  acetaminophen (TYLENOL) 500 MG tablet Take 2 tablets (1,000 mg total) by  mouth every 8 (eight) hours as needed for mild pain, fever or headache. 10/13/17   Joseph ArtVann, Jessica U, DO  apixaban (ELIQUIS) 2.5 MG TABS tablet Take 1 tablet (2.5 mg total) by mouth 2 (two) times daily. 03/15/19   Standley BrookingGoodrich, Daniel P, MD  atorvastatin (LIPITOR) 40 MG tablet Take 40 mg by mouth daily.  12/22/16   [provider]  escitalopram (LEXAPRO) 5 MG tablet Take 5 mg by mouth daily. 05/18/19   [provider]  ferrous sulfate 325 (65 FE) MG tablet Take 1 tablet (325 mg total) by mouth daily with breakfast. 03/16/19    Standley BrookingGoodrich, Daniel P, MD  hydrOXYzine (ATARAX/VISTARIL) 50 MG tablet Take 50 mg by mouth daily.  01/20/19   [provider]  Ipratropium-Albuterol (COMBIVENT) 20-100 MCG/ACT AERS respimat Inhale 1 puff into the lungs every 6 (six) hours as needed for wheezing or shortness of breath.    [provider]  ipratropium-albuterol (DUONEB) 0.5-2.5 (3) MG/3ML SOLN Take 3 mLs by nebulization every 4 (four) hours as needed (sob/wheezing). 10/13/17   Joseph ArtVann, Jessica U, DO  metoprolol succinate (TOPROL-XL) 25 MG 24 hr tablet Take 1 tablet (25 mg total) by mouth daily. 06/03/19   Narda BondsNettey, Ralph A, MD  telmisartan (MICARDIS) 40 MG tablet Take 40 mg by mouth daily. 12/31/18   [provider]    Family History Family History  Problem Relation Age of Onset   Heart attack Mother     Social History Social History   Tobacco Use   Smoking status: Current Every Day Smoker    Packs/day: 1.00    Types: Cigarettes   Smokeless tobacco: Never Used  Substance Use Topics   Alcohol use: No    Frequency: Never   Drug use: No     Allergies   Amiodarone   Review of Systems Review of Systems  Unable to perform ROS: Mental status change     Physical Exam Updated Vital Signs BP (!) 145/116    Pulse (!) 134    Temp 98 F (36.7 C) (Oral)    Resp 18    Ht 5\' 6"  (1.676 m)    Wt 49.9 kg    SpO2 98%    BMI 17.75 kg/m   Physical Exam Vitals signs and nursing note reviewed.  Constitutional:      General: She is not in acute distress.    Appearance: She is well-developed. She is not diaphoretic.     Comments: Alert and oriented to person, not oriented to place or situation.  In no acute distress.  HENT:     Head: Normocephalic.     Comments: Some tenderness over the right side of the scalp without palpable deformity, no laceration, no step-off, negative battle sign, no CSF otorrhea    Mouth/Throat:     Mouth: Mucous membranes are moist.     Pharynx: Oropharynx is clear.  Eyes:      General:        Right eye: No discharge.        Left eye: No discharge.     Extraocular Movements: Extraocular movements intact.     Pupils: Pupils are equal, round, and reactive to light.  Neck:     Musculoskeletal: Neck supple.     Comments: No midline C-spine tenderness Cardiovascular:     Rate and Rhythm: Tachycardia present. Rhythm irregular.     Heart sounds: Normal heart sounds.     Comments: Patient in A. fib with tachycardia, rates ranging between  120-130 Pulmonary:     Effort: Pulmonary effort is normal. No respiratory distress.     Breath sounds: Normal breath sounds. No wheezing or rales.     Comments: Respirations equal and unlabored, patient able to speak in full sentences, lungs clear to auscultation bilaterally, chest nontender to palpation throughout, no ecchymosis, no palpable deformity or crepitus Abdominal:     General: Abdomen is flat. Bowel sounds are normal. There is no distension.     Palpations: Abdomen is soft. There is no mass.     Tenderness: There is no abdominal tenderness. There is no guarding.     Comments: Abdomen soft, nondistended, nontender to palpation in all quadrants without guarding or peritoneal signs  Musculoskeletal:        General: No deformity.     Right lower leg: No edema.     Left lower leg: No edema.     Comments: Bilateral lower extremities warm and well perfused without edema. No midline thoracic tenderness, patient does report some mild pain across the low back without palpable deformity. All joints supple and easily movable, all compartments soft.  Skin:    General: Skin is warm and dry.     Capillary Refill: Capillary refill takes less than 2 seconds.  Neurological:     Mental Status: She is alert.     Coordination: Coordination normal.     Comments: Speech is clear, able to follow commands CN III-XII intact Normal strength in upper and lower extremities bilaterally including dorsiflexion and plantar flexion, strong and equal  grip strength Sensation normal to light and sharp touch Moves extremities without ataxia, coordination intact      ED Treatments / Results  Labs (all labs ordered are listed, but only abnormal results are displayed) Labs Reviewed  COMPREHENSIVE METABOLIC PANEL - Abnormal; Notable for the following components:      Result Value   Creatinine, Ser 1.11 (*)    GFR calc non Af Amer 44 (*)    GFR calc Af Amer 51 (*)    All other components within normal limits  CBC WITH DIFFERENTIAL/PLATELET - Abnormal; Notable for the following components:   RDW 16.2 (*)    All other components within normal limits  URINALYSIS, ROUTINE W REFLEX MICROSCOPIC - Abnormal; Notable for the following components:   Protein, ur 100 (*)    All other components within normal limits  LACTIC ACID, PLASMA - Abnormal; Notable for the following components:   Lactic Acid, Venous 2.3 (*)    All other components within normal limits  BRAIN NATRIURETIC PEPTIDE - Abnormal; Notable for the following components:   B Natriuretic Peptide 1,426.9 (*)    All other components within normal limits  TROPONIN I (HIGH SENSITIVITY) - Abnormal; Notable for the following components:   Troponin I (High Sensitivity) 37 (*)    All other components within normal limits  TROPONIN I (HIGH SENSITIVITY) - Abnormal; Notable for the following components:   Troponin I (High Sensitivity) 34 (*)    All other components within normal limits  URINE CULTURE  CULTURE, BLOOD (ROUTINE X 2)  CULTURE, BLOOD (ROUTINE X 2)  SARS CORONAVIRUS 2 (TAT 6-24 HRS)  LIPASE, BLOOD  LACTIC ACID, PLASMA  MAGNESIUM  TSH    EKG EKG Interpretation  Date/Time:  Monday June 13 2019 06:35:02 EDT Ventricular Rate:  118 PR Interval:    QRS Duration: 93 QT Interval:  346 QTC Calculation: 485 R Axis:   -65 Text Interpretation:  suspect afib Left  anterior fascicular block Repol abnrm suggests ischemia, anterolateral difficult to compare to most recent but no  obvious changes from 9/27  Reconfirmed by Marily Memos [phone]) on 06/13/2019 6:51:50 AM    Radiology Dg Chest 2 View  Result Date: 06/13/2019 CLINICAL DATA:  Status post fall. EXAM: CHEST - 2 VIEW COMPARISON:  June 01, 2019. FINDINGS: Stable cardiomegaly. No pneumothorax is noted. Atherosclerosis of thoracic aorta is noted. Mild bibasilar subsegmental atelectasis or edema are noted. No significant pleural effusion is noted. Bony thorax is unremarkable. IMPRESSION: Mild bibasilar atelectasis or edema is noted. Aortic Atherosclerosis (ICD10-I70.0). Electronically Signed   By: Lupita Raider M.D.   On: 06/13/2019 07:42   Dg Lumbar Spine Complete  Result Date: 06/13/2019 CLINICAL DATA:  Low back pain after fall. EXAM: LUMBAR SPINE - COMPLETE 4+ VIEW COMPARISON:  September 08, 2017. FINDINGS: Stable old T12 compression fracture is noted. No acute fracture or spondylolisthesis is noted. Severe degenerative disc disease is noted at L2-3 with anterior osteophyte formation. Mild degenerative disc disease is also noted at L1-2, L3-4 and L4-5 with anterior osteophyte formation. Diffuse osteopenia is noted. Calcified abdominal aortic aneurysm is noted. IMPRESSION: Multilevel degenerative disc disease is noted. Old T12 fracture is noted. No acute fracture or spondylolisthesis is noted. Calcified abdominal aortic aneurysm is noted. Ultrasound is recommended for further evaluation. Electronically Signed   By: Lupita Raider M.D.   On: 06/13/2019 07:44   Ct Head Wo Contrast  Result Date: 06/13/2019 CLINICAL DATA:  Fall with right temporal headache. EXAM: CT HEAD WITHOUT CONTRAST CT CERVICAL SPINE WITHOUT CONTRAST TECHNIQUE: Multidetector CT imaging of the head and cervical spine was performed following the standard protocol without intravenous contrast. Multiplanar CT image reconstructions of the cervical spine were also generated. COMPARISON:  03/05/2019 FINDINGS: CT HEAD FINDINGS Brain: No evidence of acute  infarction, hemorrhage, hydrocephalus, extra-axial collection or mass lesion/mass effect. Moderate remote left occipital infarct. Confluent microvascular ischemic low-density in the cerebral white matter. Ventriculomegaly primarily attributed to volume loss. Vascular: No hyperdense vessel or unexpected calcification. Skull: Negative for fracture Sinuses/Orbits: Bilateral cataract resection. No evidence of injury. CT CERVICAL SPINE FINDINGS Alignment: No traumatic malalignment. Skull base and vertebrae: No acute fracture.  C3 and C4 laminectomy. Soft tissues and spinal canal: No prevertebral fluid or swelling. No visible canal hematoma. Disc levels: Generalized disc narrowing with moderate lower cervical endplate ridging. Multilevel facet spurring bulkier on the right. Upper chest: Layering pleural effusions, small where visualized period. Atherosclerosis. IMPRESSION: 1. No evidence of acute intracranial or cervical spine injury. 2. Atrophy and advanced chronic small vessel ischemia. Remote left occipital infarct. Electronically Signed   By: Marnee Spring M.D.   On: 06/13/2019 08:00   Ct Cervical Spine Wo Contrast  Result Date: 06/13/2019 CLINICAL DATA:  Fall with right temporal headache. EXAM: CT HEAD WITHOUT CONTRAST CT CERVICAL SPINE WITHOUT CONTRAST TECHNIQUE: Multidetector CT imaging of the head and cervical spine was performed following the standard protocol without intravenous contrast. Multiplanar CT image reconstructions of the cervical spine were also generated. COMPARISON:  03/05/2019 FINDINGS: CT HEAD FINDINGS Brain: No evidence of acute infarction, hemorrhage, hydrocephalus, extra-axial collection or mass lesion/mass effect. Moderate remote left occipital infarct. Confluent microvascular ischemic low-density in the cerebral white matter. Ventriculomegaly primarily attributed to volume loss. Vascular: No hyperdense vessel or unexpected calcification. Skull: Negative for fracture Sinuses/Orbits:  Bilateral cataract resection. No evidence of injury. CT CERVICAL SPINE FINDINGS Alignment: No traumatic malalignment. Skull base and vertebrae: No acute fracture.  C3  and C4 laminectomy. Soft tissues and spinal canal: No prevertebral fluid or swelling. No visible canal hematoma. Disc levels: Generalized disc narrowing with moderate lower cervical endplate ridging. Multilevel facet spurring bulkier on the right. Upper chest: Layering pleural effusions, small where visualized period. Atherosclerosis. IMPRESSION: 1. No evidence of acute intracranial or cervical spine injury. 2. Atrophy and advanced chronic small vessel ischemia. Remote left occipital infarct. Electronically Signed   By: Monte Fantasia M.D.   On: 06/13/2019 08:00   Ct Angio Chest/abd/pel For Dissection W And/or Wo Contrast  Result Date: 06/13/2019 CLINICAL DATA:  Acute chest pain, back pain EXAM: CT ANGIOGRAPHY CHEST, ABDOMEN AND PELVIS TECHNIQUE: Multidetector CT imaging through the chest, abdomen and pelvis was performed using the standard protocol during bolus administration of intravenous contrast. Multiplanar reconstructed images and MIPs were obtained and reviewed to evaluate the vascular anatomy. CONTRAST:  65mL OMNIPAQUE IOHEXOL 350 MG/ML SOLN COMPARISON:  CT chest 10/12/2017 FINDINGS: Technical note: Examination quality is significantly degraded by extensive beam hardening and streak artifact from patient's arms with multiple metallic rings overlying the mid abdomen. Additional streak artifact from left hip arthroplasty hardware. Respiratory motion artifact degrades evaluation of the chest. CTA CHEST FINDINGS Cardiovascular: The thoracic aorta is well opacified. Borderline enlargement of the ascending thoracic aorta, unchanged from prior. No thoracic aortic dissection. There is extensive calcified and noncalcified atherosclerotic plaque throughout the aorta. Prominent coronary artery calcifications. Pulmonary vasculature is also well  opacified without central filling defect. Heart size is mildly enlarged without pericardial effusion. Mediastinum/Nodes: Negative for axillary, mediastinal, or hilar lymphadenopathy. No discrete thyroid nodule. Trachea unremarkable. Esophagus grossly unremarkable. Lungs/Pleura: Small bilateral pleural effusions with associated compressive atelectasis. Mild centrilobular emphysema with upper lobe predominance. Scattered calcified granulomas. No focal airspace consolidation. No pneumothorax. Musculoskeletal: Chronic superior endplate compression deformity of T12. Multilevel degenerative changes of the thoracic spine. No acute osseous finding. Review of the MIP images confirms the above findings. CTA ABDOMEN AND PELVIS FINDINGS VASCULAR Aorta: Ectasia of the proximal abdominal aorta measuring 2.8 cm in diameter. Aorta is tortuous. Infrarenal abdominal aorta measuring 2.8 cm in diameter. Extensive calcified and noncalcified atherosclerotic plaque throughout the aorta. No dissection is evident within the limitations of this exam. Celiac: Patent without high-grade stenosis. SMA: Focal calcified plaque at the origin of the SMA results in less than 50% stenosis. SMA is otherwise well opacified. Renals: Bilateral renal arteries patent without high-grade stenosis. IMA: IMA not well-visualized. Inflow: Prior left common iliac artery and right external iliac artery stents. 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 Extensive atherosclerotic plaques. Veins: Poorly evaluated. Review of the MIP images confirms the above findings. NON-VASCULAR Hepatobiliary: Reflux of contrast within the hepatic veins. No discrete hepatic abnormality. Gallbladder grossly unremarkable. Pancreas: Poorly evaluated.  No gross abnormality. Spleen: Scattered calcified granulomas.  Otherwise unremarkable. Adrenals/Urinary Tract: Grossly unremarkable adrenals. No hydronephrosis. Poorly evaluated rounded 2.3 cm region within the midpole of the  right kidney. No hydronephrosis. Left kidney grossly unremarkable. No obvious bladder abnormality. Stomach/Bowel: Stomach and bowel grossly unremarkable. No dilated loops of bowel. Mild rectosigmoid colonic stool volume. Lymphatic: No obvious enlarged abdominopelvic lymph nodes. Reproductive: 3.0 cm rounded lesion within the left adnexa (series 7, image 230), poorly characterized. Uterus grossly unremarkable. Other: No ascites.  No abdominal wall hernia. Musculoskeletal: Prior left hip ORIF. Degenerative changes of the lumbar spine. No fractures. Review of the MIP images confirms the above findings. IMPRESSION: 1. Significantly limited exam.  See above discussion. 2. No evidence of aortic dissection within  the limitations of this exam. 3. Ectasia of the thoracic and abdominal aorta at risk for aneurysm development. Recommend followup by abdominal ultrasound in 5 years. This recommendation follows ACR consensus guidelines: White Paper of the ACR Incidental Findings Committee II on Vascular Findings. J Am Coll Radiol 2013; 10:789-794. Aortic aneurysm NOS (ICD10-I71.9) 4. Advanced atherosclerotic disease. 5. Small bilateral pleural effusions. 6. Poorly evaluated low-density 2.3 cm lesion within the right kidney. Nonemergent renal ultrasound can be performed to further evaluate. Electronically Signed   By: Duanne Guess M.D.   On: 06/13/2019 12:47    Procedures .Critical Care Performed by: Dartha Lodge, PA-C Authorized by: Dartha Lodge, PA-C   Critical care provider statement:    Critical care time (minutes):  45   Critical care was necessary to treat or prevent imminent or life-threatening deterioration of the following conditions:  Cardiac failure (A. fib with RVR as well as sinus pauses)   Critical care was time spent personally by me on the following activities:  Discussions with consultants, evaluation of patient's response to treatment, examination of patient, ordering and performing treatments  and interventions, ordering and review of laboratory studies, ordering and review of radiographic studies, pulse oximetry, re-evaluation of patient's condition, obtaining history from patient or surrogate and review of old charts   (including critical care time)  Medications Ordered in ED Medications  metoprolol succinate (TOPROL-XL) 24 hr tablet 25 mg (25 mg Oral Given 06/13/19 0914)  HYDROcodone-acetaminophen (NORCO/VICODIN) 5-325 MG per tablet 1 tablet (has no administration in time range)  sodium chloride 0.9 % bolus 500 mL (500 mLs Intravenous New Bag/Given 06/13/19 0814)  metoprolol tartrate (LOPRESSOR) injection 2.5 mg (2.5 mg Intravenous Given 06/13/19 0815)     Initial Impression / Assessment and Plan / ED Course  I have reviewed the triage vital signs and the nursing notes.  Pertinent labs & imaging results that were available during my care of the patient were reviewed by me and considered in my medical decision making (see chart for details).  10:30 AM went into reevaluate patient's heart rate and she has pulled off monitoring, appears agitated and uncomfortable and is now complaining of abdominal pain which she localizes to her epigastric region.  Patient reports that she feels like she needs to vomit but is unable to.  Patient seems restless and uncomfortable.  Heart rate remains elevated into the 120s with A. fib despite metoprolol XL and 2.5 of IV Lopressor, will start patient on diltiazem drip, IV pain medication and nausea medication ordered for the patient lactic acid, blood cultures and lipase added onto lab evaluation.  On return to the room patient is also reporting some burning in the chest.  We will also get a troponin and BNP.  Will get CT dissection study to allow [place] to fully image the chest abdomen and pelvis.  I called and discussed this with daughter she reports that she has some vomiting occasionally but had not been complaining of abdominal pain at home, discussed  that patient will likely require admission given continued A. fib with RVR.  Daughter expresses understanding and is in full agreement with this plan.  Cardiac monitoring reviewed and chest before 10 AM patient was having some pauses on heart monitor, nursing staff noted that patient complaining of nausea and feeling like she needed to vomit at this time question if she was having some vagal reaction, but will continue to monitor closely.  11:30 AM patient had a second longer pause on cardiac  monitoring that was witnessed by nurse, came to shortly after and then stated that she thinks she may have just passed out.  Repeat EKG collected, consult placed to cardiology who will come and evaluate patient at bedside.  Patient having some O2 desaturations, placed on nasal cannula.  Concern patient may be having arrhythmia and intermittent syncopal episodes versus developing MI.  Mental status appears to be worsening patient becoming increasingly confused, seems to have difficulty getting out the words she wishes to say.  Dr. Clarice Pole has reevaluated patient, called and spoke with patient's daughter and confirmed CODE STATUS, daughter reports that patient is a DNR, she would like full evaluation and treatment of conditions but does not want intubation or chest compressions.  12:43 PM notified by nursing staff that patient appears to have had a rhythm change she now appears to be in sinus rhythm with frequent PVCs every 3-5 beats, patient denies any pain at this time.  She is laying comfortably in bed.  1:30 PM on reassessment patient has gone back into A. fib with RVR with heart rates ranging in the 110s-120s, she is asymptomatic with this sleeping comfortably.  Dissection study is limited due to artifact but shows no obvious dissection or other significant acute abnormality.  There is evidence of ectasia of the thoracic aorta at risk for development of aortic aneurysm.  Case discussed with NP Berton Bon with  cardiology who has reviewed patient's rhythm strips, unclear what exactly is going on but she has discussed with the EP who will see the patient in consult, she had previously been evaluated for a pacemaker but at that time was not a candidate.  Cardiology agrees with medicine admission for continued monitoring.  Cardiology has ordered one-time dose of Lasix for the patient and they recommend resuming her Toprol-XL at a lower dose of 12.5 mg to help reduce prolonged pauses.  Will hold Eliquis at this time for potential pacemaker placement.  Case discussed with internal medicine teaching service who will see and admit the patient.  Patient discussed with Dr. Donnald Garre, who saw patient as well and agrees with plan.   Final Clinical Impressions(s) / ED Diagnoses   Final diagnoses:  Atrial fibrillation with RVR (HCC)  Sinus pause  Fall, initial encounter  Epigastric pain  Intermittent chest pain    ED Discharge Orders    None       Dartha Lodge, [place] 06/13/19 1630    Arby Barrette, MD 06/22/19 0900

## 2019-06-13 NOTE — ED Provider Notes (Signed)
Medical screening examination/treatment/procedure(s) were conducted as a shared visit with non-physician practitioner(s) and myself.  I personally evaluated the patient during the encounter.  EKG Interpretation  Date/Time:  Monday June 13 2019 06:35:02 EDT Ventricular Rate:  118 PR Interval:    QRS Duration: 93 QT Interval:  346 QTC Calculation: 485 R Axis:   -65 Text Interpretation:  suspect afib Left anterior fascicular block Repol abnrm suggests ischemia, anterolateral difficult to compare to most recent but no obvious changes from 9/27  Reconfirmed by Mesner, Corene Cornea [phone]) on 06/13/2019 6:51:50 AM  Patient lives home with her daughter.  She does have a history of frequent falls.  Her daughter was at work and there was a friend with her this evening.  Patient had gotten up and had an unwitnessed fall.  No mental status change.  Patient reports he did hit the back of her head.  Patient reports she has a lot of chronic problems with back pain and frequent falls.  She denies any weakness or numbness into her legs.  Patient is alert and appropriate.  She is answering questions and is situationally oriented.  Following commands appropriately.  No respiratory distress.  Minor contusion to the scalp with no bleeding.  Moving both lower extremities at command.  I agree with plan of management.   Charlesetta Shanks, MD 06/13/19 [phone]

## 2019-06-13 NOTE — ED Triage Notes (Signed)
Pt arrived via [facility] c/o pain to lateral aspect of right head after experiencing a ground level fall in her bedroom around 0430. Pt presents with AMS last seen normal around 2100 on 06/12/19. PMHX of stroke and atrial fibrillation. VS Stable, HR 90s-140 during transport.

## 2019-06-13 NOTE — H&P (Addendum)
Date: 06/13/2019               Patient Name:  Sherry [name] MRN: [id number]  DOB: 1929-03-30 Age / Sex: 85 y.o., female   PCP: Lujean Amel, MD              Medical Service: Internal Medicine Teaching Service              Attending Physician: Dr. Velna Ochs, MD    First Contact: Flonnie Hailstone, MS4 Pager: [phone]  Second Contact: Dr. Hayden Rasmussen Pager: [phone]            After Hours (After 5p/  First Contact Pager: [phone]  weekends / holidays): Second Contact Pager: [phone]   Chief Complaint: Ground level fall  History of Present Illness: Sherry [name] is an 65yoF with a h/o atrial fibrillation, stroke, hypertension, hyperlipidemia, CAD, CHF and COPD who presented due to ground-level fall in her bedroom around 4:30 this morning. At bedside patient is pleasant and alert to self and place but does not answer other questions. She does state she smokes 3 cigarettes per day.  Accurate HPI, PMH and ROS could not be obtained 2/2 patient's dementia versus acute AMS.   Per ED note and daughter, since the fall, pt reported she has been experiencing pain over the right side of her head but did not think that she hit her head. Patient did not think that she lost consciousness. Daughter last saw patient normal around 9pm. Fall was unwitnessed. Daughter states that at baseline patient is oriented to self and knows that she lives with her daughter but is not oriented to place or situation. Pt denied having any chest pain or shortness of breath.  Denies sensation of palpitations. Pt reports that initially after the fall she had some mild pain in her neck and low back although this seems to improve per patient.  No lacerations or abrasions from fall.  Pt denies any fevers or recent illness.  Pt denies dysuria or urinary frequency.Pt has been taking medications regularly.  Her daughter states that Sherry [name] is DNR. She is not sure if her mother would want to have a pacemaker placed. 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 She does not think her mother would want external pacing.   Meds:  Current Meds  Medication Sig  . acetaminophen (TYLENOL) 500 MG tablet Take 2 tablets (1,000 mg total) by mouth every 8 (eight) hours as needed for mild pain, fever or headache.  Marland Kitchen apixaban (ELIQUIS) 2.5 MG TABS tablet Take 1 tablet (2.5 mg total) by mouth 2 (two) times daily.  Marland Kitchen atorvastatin (LIPITOR) 40 MG tablet Take 40 mg by mouth daily.   Marland Kitchen escitalopram (LEXAPRO) 5 MG tablet Take 5 mg by mouth daily.  . ferrous sulfate 325 (65 FE) MG tablet Take 1 tablet (325 mg total) by mouth daily with breakfast.  . hydrOXYzine (ATARAX/VISTARIL) 50 MG tablet Take 50 mg by mouth at bedtime.   . Ipratropium-Albuterol (COMBIVENT) 20-100 MCG/ACT AERS respimat Inhale 1 puff into the lungs every 6 (six) hours as needed for wheezing or shortness of breath.  Marland Kitchen ipratropium-albuterol (DUONEB) 0.5-2.5 (3) MG/3ML SOLN Take 3 mLs by nebulization every 4 (four) hours as needed (sob/wheezing).  . metoprolol succinate (TOPROL-XL) 25 MG 24 hr tablet Take 1 tablet (25 mg total) by mouth daily.  Marland Kitchen telmisartan (MICARDIS) 40 MG tablet Take 40 mg by mouth daily.  Allergies: Allergies as of 06/13/2019 - Review Complete 06/13/2019  Allergen Reaction Noted  . Amiodarone  06/02/2019   Past Medical History:  Diagnosis Date  . Atrial fibrillation (HCC)   . CAD (coronary artery disease)   . Chronic diastolic CHF (congestive heart failure) (HCC)   . COPD (chronic obstructive pulmonary disease) (HCC)   . Hypercholesteremia   . Hypertension   . Ischemic stroke (HCC)   . Moderate tricuspid regurgitation     Family History: Accurate HPI, PMH and ROS could not be obtained 2/2 patient's dementia versus acute AMS.   Social History: Accurate HPI, PMH and ROS could not be obtained 2/2 patient's dementia versus acute AMS.  Patient lives at home with her daughter.    Review of Systems: Accurate HPI, PMH and ROS could not be obtained 2/2 patient's dementia versus acute AMS.   Physical Exam: General: Frail female, NAD HEENT: PERRLA, EOMI, mild tenderness over right side of scalp Pulm: CTAB, No r/r/w, fingers stained 2/2 cigarette use Cardio: Irregularly irregular rhythm, tachycardic to 110s on exam, S1, S2, No m/r/g Abdominal: Tender to palpation diffusely, nondistended, +BS Neuro: Alert and oriented x 2, no focal deficits MSK: No gross motor deficits, extremities warm and well perfused Psych: Pt reports seeing a tractor while being interviewed, pt response to questions inappropriate  Assessment & Plan by Problem: Active Problems:   Atrial fibrillation with RVR [facility])  Summary: Sherry [name] is an 30yoF with a h/o atrial fibrillation, stroke, hypertension, hyperlipidemia, CAD, CHF and COPD who presented due to ground-level fall in her bedroom around 4:30 this morning likely 2/2 atrial fibrillation w/RVR.  Atrial Fibrillation w/RVR  Sick Sinus Syndrome: Pt presented with atrial fibrillation w/RVR. She was given Lopressor 2.5mg  IV followed by Toprol-XL 25mg  daily. Toprol recently increased from 12.5 qd to 25qd. She converted to sinus rhythm with 4-9 second pauses. Pt has a history of sinus pauses when in sinus rhythm. Pt likely has sick sinus syndrome with atrial fibrillation as her escape rhythm. Pt would benefit from pacemaker; however, pacemaker likely not in line with pt preferences per healthcare decision maker, daughter [phone]).  - holding apixaban until EP consult. -Cardiology/Electrophysiology following, appreciate recs -Toprol-XL 12.5mg  daily per cardiology recommendations -Strict Is and Os -Strict bed rest -Fall precautions -Telemetry -BMP daily -CBC  Abdominal Pain: Pt last reported bowel movement from daughter is Saturday. Bowels reportedly irregular at baseline. Constipation thought likely cause of abdominal pain. CT abd  without acute findings.  -Senokot 1 tablet, BID -Zofran 4mg  q8hrs prn nausea and vomiting -Bladder scan  Stable Medical Conditions COPD: Stable. -Duoneb 0.5-2.5mg  q6hrs prn wheezing, shortness of breath HFrEF  Takotsubo cardiomyopathy: Stable. Additional diuresis per cardiology. -IV Furosemide 40mg  once per cardiology Hypertension: BP 174/72mmHg.  -Irbesartan 75mg  daily per cardiology Hyperlipidemia: Stable. -Continue home atorvastatin 40mg  daily  Dispo: Admit patient to Inpatient with expected length of stay greater than 2 midnights.   DVT PPX: SCDs pending EP consult Code: DNR, confirmed w/pt daughter Diet: NPO pending EP seeing pt  Signed: , Medical Student 06/13/2019, 5:57 PM

## 2019-06-13 NOTE — ED Notes (Signed)
Patient transported to CT 

## 2019-06-13 NOTE — ED Notes (Addendum)
Calle dto room by tech for slow heart rate. Pt states she feels nauseated and "throwing up" but no emesis noted. Pt repositioned and clothing replaced. Monitors also re[laced. PA Ford aware

## 2019-06-13 NOTE — ED Notes (Signed)
ED TO INPATIENT HANDOFF REPORT  ED Nurse Name and Phone #: [phone]  S Name/Age/Gender Sherry [name] 85 y.o. female Room/Bed: 031C/031C  Code Status   Code Status: DNR  Home/SNF/Other Home Patient oriented to: self Is this baseline? Yes   Triage Complete: Triage complete  Chief Complaint fall  Triage Note Pt arrived via [facility] c/o pain to lateral aspect of right head after experiencing a ground level fall in her bedroom around 0430. Pt presents with AMS last seen normal around 2100 on 06/12/19. PMHX of stroke and atrial fibrillation. VS Stable, HR 90s-140 during transport.   Allergies Allergies  Allergen Reactions  . Amiodarone     Level of Care/Admitting Diagnosis ED Disposition    ED Disposition Condition [facility] Area: [place] [[id number]]  Level of Care: Progressive [102]  Covid Evaluation: Asymptomatic Screening Protocol (No Symptoms)  Diagnosis: Atrial fibrillation with RVR [facility]) [[id number]]  Admitting Physician: Velna Ochs [[id number]]  Attending Physician: Velna Ochs [[id number]]  Estimated length of stay: past midnight tomorrow  Certification:: I certify this patient will need inpatient services for at least 2 midnights  PT Class (Do Not Modify): Inpatient [101]  PT Acc Code (Do Not Modify): Private [1]       B Medical/Surgery History Past Medical History:  Diagnosis Date  . Atrial fibrillation ([place])   . CAD (coronary artery disease)   . Chronic diastolic CHF (congestive heart failure) ([place])   . COPD (chronic obstructive pulmonary disease) ([place])   . Hypercholesteremia   . Hypertension   . Ischemic stroke ([place])   . Moderate tricuspid regurgitation    Past Surgical History:  Procedure Laterality Date  . CORONARY ANGIOPLASTY WITH STENT PLACEMENT    . HIP SURGERY       A IV Location/Drains/Wounds Patient Lines/Drains/Airways Status   Active Line/Drains/Airways    Name:   Placement date:   Placement time:    Site:   Days:   Peripheral IV 06/13/19 Left Forearm   06/13/19    0654    Forearm   less than 1   Peripheral IV 06/13/19 Left Antecubital   06/13/19    1114    Antecubital   less than 1   External Urinary Catheter   06/13/19    0851    -   less than 1          Intake/Output Last 24 hours  Intake/Output Summary (Last 24 hours) at 06/13/2019 1907 Last data filed at 06/13/2019 1900 Gross per 24 hour  Intake -  Output 800 ml  Net -800 ml    Labs/Imaging Results for orders placed or performed during the hospital encounter of 06/13/19 (from the past 48 hour(s))  Comprehensive metabolic panel     Status: Abnormal   Collection Time: 06/13/19  6:47 AM  Result Value Ref Range   Sodium 139 135 - 145 mmol/L   Potassium 4.2 3.5 - 5.1 mmol/L   Chloride 104 98 - 111 mmol/L   CO2 24 22 - 32 mmol/L   Glucose, Bld 98 70 - 99 mg/dL   BUN 18 8 - 23 mg/dL   Creatinine, Ser 1.11 (H) 0.44 - 1.00 mg/dL   Calcium 9.4 8.9 - 10.3 mg/dL   Total Protein 7.1 6.5 - 8.1 g/dL   Albumin 3.7 3.5 - 5.0 g/dL   AST 27 15 - 41 U/L   ALT 24 0 - 44 U/L   Alkaline Phosphatase 83 38 - 126  U/L   Total Bilirubin 0.9 0.3 - 1.2 mg/dL   GFR calc non Af Amer 44 (L) >60 mL/min   GFR calc Af Amer 51 (L) >60 mL/min   Anion gap 11 5 - 15    Comment: Performed at [facility], [address] [place], [place] [address]  CBC with Differential     Status: Abnormal   Collection Time: 06/13/19  6:47 AM  Result Value Ref Range   WBC 6.8 4.0 - 10.5 K/uL   RBC 4.74 3.87 - 5.11 MIL/uL   Hemoglobin 14.1 12.0 - 15.0 g/dL   HCT 60.445.2 54.036.0 - 98.146.0 %   MCV 95.4 80.0 - 100.0 fL   MCH 29.7 26.0 - 34.0 pg   MCHC 31.2 30.0 - 36.0 g/dL   RDW 19.116.2 (H) 47.811.5 - 29.515.5 %   Platelets 271 150 - 400 K/uL   nRBC 0.0 0.0 - 0.2 %   Neutrophils Relative % 69 %   Neutro Abs 4.6 1.7 - 7.7 K/uL   Lymphocytes Relative 24 %   Lymphs Abs 1.6 0.7 - 4.0 K/uL   Monocytes Relative 6 %   Monocytes Absolute 0.4 0.1 - 1.0 K/uL   Eosinophils  Relative 1 %   Eosinophils Absolute 0.0 0.0 - 0.5 K/uL   Basophils Relative 0 %   Basophils Absolute 0.0 0.0 - 0.1 K/uL   Immature Granulocytes 0 %   Abs Immature Granulocytes 0.03 0.00 - 0.07 K/uL    Comment: Performed at [facility], [address] [place], [place] [address]  Lipase, blood     Status: None   Collection Time: 06/13/19 10:48 AM  Result Value Ref Range   Lipase 21 11 - 51 U/L    Comment: Performed at [facility], [address] [place], [place] [address]  Lactic acid, plasma     Status: Abnormal   Collection Time: 06/13/19 10:48 AM  Result Value Ref Range   Lactic Acid, Venous 2.3 (HH) 0.5 - 1.9 mmol/L    Comment: CRITICAL RESULT CALLED TO, READ BACK BY AND VERIFIED WITH: Greg CutterM GAGE RN [phone] BY A BENNETT Performed at [facility], [address] [place], [place] [address]   Urinalysis, Routine w reflex microscopic     Status: Abnormal   Collection Time: 06/13/19 11:06 AM  Result Value Ref Range   Color, Urine YELLOW YELLOW   APPearance CLEAR CLEAR   Specific Gravity, Urine 1.019 1.005 - 1.030   pH 6.0 5.0 - 8.0   Glucose, UA NEGATIVE NEGATIVE mg/dL   Hgb urine dipstick NEGATIVE NEGATIVE   Bilirubin Urine NEGATIVE NEGATIVE   Ketones, ur NEGATIVE NEGATIVE mg/dL   Protein, ur 027100 (A) NEGATIVE mg/dL   Nitrite NEGATIVE NEGATIVE   Leukocytes,Ua NEGATIVE NEGATIVE   RBC / HPF 0-5 0 - 5 RBC/hpf   WBC, UA 6-10 0 - 5 WBC/hpf   Bacteria, UA NONE SEEN NONE SEEN   Squamous Epithelial / LPF 0-5 0 - 5    Comment: Performed at [facility], [address] [place], [place] [address]  Troponin I (High Sensitivity)     Status: Abnormal   Collection Time: 06/13/19 11:13 AM  Result Value Ref Range   Troponin I (High Sensitivity) 37 (H) <18 ng/L    Comment: (NOTE) Elevated high sensitivity troponin I (hsTnI) values and significant  changes across serial measurements may suggest ACS but many other  chronic and acute conditions are known  to  elevate hsTnI results.  Refer to the "Links" section for chest pain algorithms and additional  guidance. Performed at [facility], [address], [place], [place] [address]   Brain natriuretic peptide     Status: Abnormal   Collection Time: 06/13/19 11:14 AM  Result Value Ref Range   B Natriuretic Peptide 1,426.9 (H) 0.0 - 100.0 pg/mL    Comment: Performed at [facility], [address], [place], [place] [address]  SARS CORONAVIRUS 2 (TAT 6-24 HRS) Nasopharyngeal Nasopharyngeal Swab     Status: None   Collection Time: 06/13/19  1:03 PM   Specimen: Nasopharyngeal Swab  Result Value Ref Range   SARS Coronavirus 2 NEGATIVE NEGATIVE    Comment: (NOTE) SARS-CoV-2 target nucleic acids are NOT DETECTED. The SARS-CoV-2 RNA is generally detectable in upper and lower respiratory specimens during the acute phase of infection. Negative results do not preclude SARS-CoV-2 infection, do not rule out co-infections with other pathogens, and should not be used as the sole basis for treatment or other patient management decisions. Negative results must be combined with clinical observations, patient history, and epidemiological information. The expected result is Negative. Fact Sheet for Patients: HairSlick.no Fact Sheet for Healthcare Providers: quierodirigir.com This test is not yet approved or cleared by the [place] FDA and  has been authorized for detection and/or diagnosis of SARS-CoV-2 by FDA under an Emergency Use Authorization (EUA). This EUA will remain  in effect (meaning this test can be used) for the duration of the COVID-19 declaration under Section 56 4(b)(1) of the Act, 21 U.S.C. section 360bbb-3(b)(1), unless the authorization is terminated or revoked sooner. Performed at [facility], [address], [place], [place] [address]   Lactic acid, plasma     Status: None   Collection Time: 06/13/19  2:44  PM  Result Value Ref Range   Lactic Acid, Venous 1.7 0.5 - 1.9 mmol/L    Comment: Performed at [facility], [address] [place], [place] [address]  Troponin I (High Sensitivity)     Status: Abnormal   Collection Time: 06/13/19  2:44 PM  Result Value Ref Range   Troponin I (High Sensitivity) 34 (H) <18 ng/L    Comment: (NOTE) Elevated high sensitivity troponin I (hsTnI) values and significant  changes across serial measurements may suggest ACS but many other  chronic and acute conditions are known to elevate hsTnI results.  Refer to the "Links" section for chest pain algorithms and additional  guidance. Performed at [facility], [address], [place], [place] [address]   Magnesium     Status: None   Collection Time: 06/13/19  2:44 PM  Result Value Ref Range   Magnesium 1.9 1.7 - 2.4 mg/dL    Comment: Performed at [facility], [address], [place], [place] [address]   Dg Chest 2 View  Result Date: 06/13/2019 CLINICAL DATA:  Status post fall. EXAM: CHEST - 2 VIEW COMPARISON:  June 01, 2019. FINDINGS: Stable cardiomegaly. No pneumothorax is noted. Atherosclerosis of thoracic aorta is noted. Mild bibasilar subsegmental atelectasis or edema are noted. No significant pleural effusion is noted. Bony thorax is unremarkable. IMPRESSION: Mild bibasilar atelectasis or edema is noted. Aortic Atherosclerosis (ICD10-I70.0). Electronically Signed   By: Lupita Raider M.D.   On: 06/13/2019 07:42   Dg Lumbar Spine Complete  Result Date: 06/13/2019 CLINICAL DATA:  Low back pain after fall. EXAM: LUMBAR SPINE - COMPLETE  4+ VIEW COMPARISON:  September 08, 2017. FINDINGS: Stable old T12 compression fracture is noted. No acute fracture or spondylolisthesis is noted. Severe degenerative disc disease is noted at L2-3 with anterior osteophyte formation. Mild degenerative disc disease is also noted at L1-2, L3-4 and L4-5 with anterior osteophyte formation. Diffuse osteopenia is  noted. Calcified abdominal aortic aneurysm is noted. IMPRESSION: Multilevel degenerative disc disease is noted. Old T12 fracture is noted. No acute fracture or spondylolisthesis is noted. Calcified abdominal aortic aneurysm is noted. Ultrasound is recommended for further evaluation. Electronically Signed   By: Lupita Raider M.D.   On: 06/13/2019 07:44   Ct Head Wo Contrast  Result Date: 06/13/2019 CLINICAL DATA:  Fall with right temporal headache. EXAM: CT HEAD WITHOUT CONTRAST CT CERVICAL SPINE WITHOUT CONTRAST TECHNIQUE: Multidetector CT imaging of the head and cervical spine was performed following the standard protocol without intravenous contrast. Multiplanar CT image reconstructions of the cervical spine were also generated. COMPARISON:  03/05/2019 FINDINGS: CT HEAD FINDINGS Brain: No evidence of acute infarction, hemorrhage, hydrocephalus, extra-axial collection or mass lesion/mass effect. Moderate remote left occipital infarct. Confluent microvascular ischemic low-density in the cerebral white matter. Ventriculomegaly primarily attributed to volume loss. Vascular: No hyperdense vessel or unexpected calcification. Skull: Negative for fracture Sinuses/Orbits: Bilateral cataract resection. No evidence of injury. CT CERVICAL SPINE FINDINGS Alignment: No traumatic malalignment. Skull base and vertebrae: No acute fracture.  C3 and C4 laminectomy. Soft tissues and spinal canal: No prevertebral fluid or swelling. No visible canal hematoma. Disc levels: Generalized disc narrowing with moderate lower cervical endplate ridging. Multilevel facet spurring bulkier on the right. Upper chest: Layering pleural effusions, small where visualized period. Atherosclerosis. IMPRESSION: 1. No evidence of acute intracranial or cervical spine injury. 2. Atrophy and advanced chronic small vessel ischemia. Remote left occipital infarct. Electronically Signed   By: Marnee Spring M.D.   On: 06/13/2019 08:00   Ct Cervical Spine  Wo Contrast  Result Date: 06/13/2019 CLINICAL DATA:  Fall with right temporal headache. EXAM: CT HEAD WITHOUT CONTRAST CT CERVICAL SPINE WITHOUT CONTRAST TECHNIQUE: Multidetector CT imaging of the head and cervical spine was performed following the standard protocol without intravenous contrast. Multiplanar CT image reconstructions of the cervical spine were also generated. COMPARISON:  03/05/2019 FINDINGS: CT HEAD FINDINGS Brain: No evidence of acute infarction, hemorrhage, hydrocephalus, extra-axial collection or mass lesion/mass effect. Moderate remote left occipital infarct. Confluent microvascular ischemic low-density in the cerebral white matter. Ventriculomegaly primarily attributed to volume loss. Vascular: No hyperdense vessel or unexpected calcification. Skull: Negative for fracture Sinuses/Orbits: Bilateral cataract resection. No evidence of injury. CT CERVICAL SPINE FINDINGS Alignment: No traumatic malalignment. Skull base and vertebrae: No acute fracture.  C3 and C4 laminectomy. Soft tissues and spinal canal: No prevertebral fluid or swelling. No visible canal hematoma. Disc levels: Generalized disc narrowing with moderate lower cervical endplate ridging. Multilevel facet spurring bulkier on the right. Upper chest: Layering pleural effusions, small where visualized period. Atherosclerosis. IMPRESSION: 1. No evidence of acute intracranial or cervical spine injury. 2. Atrophy and advanced chronic small vessel ischemia. Remote left occipital infarct. Electronically Signed   By: Marnee Spring M.D.   On: 06/13/2019 08:00   Ct Angio Chest/abd/pel For Dissection W And/or Wo Contrast  Result Date: 06/13/2019 CLINICAL DATA:  Acute chest pain, back pain EXAM: CT ANGIOGRAPHY CHEST, ABDOMEN AND PELVIS TECHNIQUE: Multidetector CT imaging through the chest, abdomen and pelvis was performed using the standard protocol during bolus administration of intravenous contrast. Multiplanar reconstructed images and  MIPs were obtained and reviewed to evaluate the vascular anatomy. CONTRAST:  80mL OMNIPAQUE IOHEXOL 350 MG/ML SOLN COMPARISON:  CT chest 10/12/2017 FINDINGS: Technical note: Examination quality is significantly degraded by extensive beam hardening and streak artifact from patient's arms with multiple metallic rings overlying the mid abdomen. Additional streak artifact from left hip arthroplasty hardware. Respiratory motion artifact degrades evaluation of the chest. CTA CHEST FINDINGS Cardiovascular: The thoracic aorta is well opacified. Borderline enlargement of the ascending thoracic aorta, unchanged from prior. No thoracic aortic dissection. There is extensive calcified and noncalcified atherosclerotic plaque throughout the aorta. Prominent coronary artery calcifications. Pulmonary vasculature is also well opacified without central filling defect. Heart size is mildly enlarged without pericardial effusion. Mediastinum/Nodes: Negative for axillary, mediastinal, or hilar lymphadenopathy. No discrete thyroid nodule. Trachea unremarkable. Esophagus grossly unremarkable. Lungs/Pleura: Small bilateral pleural effusions with associated compressive atelectasis. Mild centrilobular emphysema with upper lobe predominance. Scattered calcified granulomas. No focal airspace consolidation. No pneumothorax. Musculoskeletal: Chronic superior endplate compression deformity of T12. Multilevel degenerative changes of the thoracic spine. No acute osseous finding. Review of the MIP images confirms the above findings. CTA ABDOMEN AND PELVIS FINDINGS VASCULAR Aorta: Ectasia of the proximal abdominal aorta measuring 2.8 cm in diameter. Aorta is tortuous. Infrarenal abdominal aorta measuring 2.8 cm in diameter. Extensive calcified and noncalcified atherosclerotic plaque throughout the aorta. No dissection is evident within the limitations of this exam. Celiac: Patent without high-grade stenosis. SMA: Focal calcified plaque at the origin of  the SMA results in less than 50% stenosis. SMA is otherwise well opacified. Renals: Bilateral renal arteries patent without high-grade stenosis. IMA: IMA not well-visualized. Inflow: Prior left common iliac artery and right external iliac artery stents. 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 Extensive atherosclerotic plaques. Veins: Poorly evaluated. Review of the MIP images confirms the above findings. NON-VASCULAR Hepatobiliary: Reflux of contrast within the hepatic veins. No discrete hepatic abnormality. Gallbladder grossly unremarkable. Pancreas: Poorly evaluated.  No gross abnormality. Spleen: Scattered calcified granulomas.  Otherwise unremarkable. Adrenals/Urinary Tract: Grossly unremarkable adrenals. No hydronephrosis. Poorly evaluated rounded 2.3 cm region within the midpole of the right kidney. No hydronephrosis. Left kidney grossly unremarkable. No obvious bladder abnormality. Stomach/Bowel: Stomach and bowel grossly unremarkable. No dilated loops of bowel. Mild rectosigmoid colonic stool volume. Lymphatic: No obvious enlarged abdominopelvic lymph nodes. Reproductive: 3.0 cm rounded lesion within the left adnexa (series 7, image 230), poorly characterized. Uterus grossly unremarkable. Other: No ascites.  No abdominal wall hernia. Musculoskeletal: Prior left hip ORIF. Degenerative changes of the lumbar spine. No fractures. Review of the MIP images confirms the above findings. IMPRESSION: 1. Significantly limited exam.  See above discussion. 2. No evidence of aortic dissection within the limitations of this exam. 3. Ectasia of the thoracic and abdominal aorta at risk for aneurysm development. Recommend followup by abdominal ultrasound in 5 years. This recommendation follows ACR consensus guidelines: White Paper of the ACR Incidental Findings Committee II on Vascular Findings. J Am Coll Radiol 2013; 10:789-794. Aortic aneurysm NOS (ICD10-I71.9) 4. Advanced atherosclerotic disease. 5. Small  bilateral pleural effusions. 6. Poorly evaluated low-density 2.3 cm lesion within the right kidney. Nonemergent renal ultrasound can be performed to further evaluate. Electronically Signed   By: Duanne Guess M.D.   On: 06/13/2019 12:47    Pending Labs Unresulted Labs (From admission, onward)    Start     Ordered   06/14/19 0500  Magnesium  Tomorrow morning,   R     06/13/19 1624   06/14/19 0500  Phosphorus  Tomorrow morning,   R     06/13/19 1624   06/14/19 0500  Comprehensive metabolic panel  Tomorrow morning,   R     06/13/19 1624   06/14/19 0500  CBC  Tomorrow morning,   R     06/13/19 1624   06/13/19 1450  TSH  Add-on,   AD     06/13/19 1449   06/13/19 1025  Blood culture (routine x 2)  BLOOD CULTURE X 2,   STAT     06/13/19 1025   06/13/19 0648  Urine culture  ONCE - STAT,   STAT     06/13/19 0650          Vitals/Pain Today's Vitals   06/13/19 1745 06/13/19 1800 06/13/19 1815 06/13/19 1901  BP: (!) 148/108 (!) 137/105 (!) 174/85   Pulse: (!) 121 (!) 156 61   Resp: 12 (!) 21 15   Temp:      TempSrc:      SpO2: 96% 97% 98%   Weight:      Height:      PainSc:    0-No pain    Isolation Precautions No active isolations  Medications Medications  irbesartan (AVAPRO) tablet 75 mg (75 mg Oral Given 06/13/19 1655)  metoprolol succinate (TOPROL-XL) 24 hr tablet 12.5 mg (has no administration in time range)  senna-docusate (Senokot-S) tablet 1 tablet (has no administration in time range)  ipratropium-albuterol (DUONEB) 0.5-2.5 (3) MG/3ML nebulizer solution 3 mL (has no administration in time range)  apixaban (ELIQUIS) tablet 2.5 mg (has no administration in time range)  sodium chloride 0.9 % bolus 500 mL (0 mLs Intravenous Stopped 06/13/19 0950)  metoprolol tartrate (LOPRESSOR) injection 2.5 mg (2.5 mg Intravenous Given 06/13/19 0815)  ondansetron (ZOFRAN) injection 4 mg (4 mg Intravenous Given 06/13/19 1118)  famotidine (PEPCID) IVPB 20 mg premix (0 mg Intravenous  Stopped 06/13/19 1236)  iohexol (OMNIPAQUE) 350 MG/ML injection 80 mL (80 mLs Intravenous Contrast Given 06/13/19 1213)  furosemide (LASIX) injection 40 mg (40 mg Intravenous Given 06/13/19 1526)    Mobility non-ambulatory High fall risk   Focused Assessments Cardiac Assessment Handoff:  Cardiac Rhythm: Normal sinus rhythm, Atrial fibrillation Lab Results  Component Value Date   CKTOTAL 268 (H) 03/05/2019   TROPONINI 0.04 (HH) 10/12/2017   Lab Results  Component Value Date   DDIMER 10.90 (H) 10/11/2017   Does the Patient currently have chest pain? No     R Recommendations: See Admitting Provider Note  Report given to:   Additional Notes:

## 2019-06-13 NOTE — ED Notes (Signed)
Pt denies pai or headache. Pt redirected and clothing and monitor replaced that pt had removed.

## 2019-06-13 NOTE — Progress Notes (Signed)
Considering the pending potential plan for pacemaker placement, discussed with patient's daughter about whether she would want external pacing in the event of extended sinus pauses. Discussed risks and benefits and relation to ACLS. Daughter states her mother would not want external pacing.   Molli Hazard A, DO 06/13/2019, 10:03 PM Pager: [phone]

## 2019-06-13 NOTE — ED Notes (Signed)
Sinus pause noted and pt twiches, then states "I passed out for a second"

## 2019-06-13 NOTE — ED Notes (Addendum)
Dr Colvin Caroli at bedside. O2 at 2 l/ will hold fentanyl and cardizem at present

## 2019-06-13 NOTE — Progress Notes (Signed)
Patient had a 4.03 sec pause and went into SR/SB. MD has been paged and gave orders to monitor the patient for now.

## 2019-06-14 DIAGNOSIS — Z66 Do not resuscitate: Secondary | ICD-10-CM

## 2019-06-14 DIAGNOSIS — E785 Hyperlipidemia, unspecified: Secondary | ICD-10-CM

## 2019-06-14 DIAGNOSIS — I482 Chronic atrial fibrillation, unspecified: Secondary | ICD-10-CM

## 2019-06-14 DIAGNOSIS — I4891 Unspecified atrial fibrillation: Secondary | ICD-10-CM

## 2019-06-14 DIAGNOSIS — I110 Hypertensive heart disease with heart failure: Secondary | ICD-10-CM

## 2019-06-14 DIAGNOSIS — I455 Other specified heart block: Secondary | ICD-10-CM

## 2019-06-14 DIAGNOSIS — I2510 Atherosclerotic heart disease of native coronary artery without angina pectoris: Secondary | ICD-10-CM

## 2019-06-14 DIAGNOSIS — Z7189 Other specified counseling: Secondary | ICD-10-CM

## 2019-06-14 DIAGNOSIS — J449 Chronic obstructive pulmonary disease, unspecified: Secondary | ICD-10-CM

## 2019-06-14 DIAGNOSIS — I5020 Unspecified systolic (congestive) heart failure: Secondary | ICD-10-CM

## 2019-06-14 LAB — COMPREHENSIVE METABOLIC PANEL
ALT: 29 U/L (ref 0–44)
AST: 32 U/L (ref 15–41)
Albumin: 3.6 g/dL (ref 3.5–5.0)
Alkaline Phosphatase: 76 U/L (ref 38–126)
Anion gap: 13 (ref 5–15)
BUN: 22 mg/dL (ref 8–23)
CO2: 24 mmol/L (ref 22–32)
Calcium: 9.3 mg/dL (ref 8.9–10.3)
Chloride: 103 mmol/L (ref 98–111)
Creatinine, Ser: 1.29 mg/dL — ABNORMAL HIGH (ref 0.44–1.00)
GFR calc Af Amer: 43 mL/min — ABNORMAL LOW (ref >60)
GFR calc non Af Amer: 37 mL/min — ABNORMAL LOW (ref >60)
Glucose, Bld: 64 mg/dL — ABNORMAL LOW (ref 70–99)
Potassium: 4.4 mmol/L (ref 3.5–5.1)
Sodium: 140 mmol/L (ref 135–145)
Total Bilirubin: 1.3 mg/dL — ABNORMAL HIGH (ref 0.3–1.2)
Total Protein: 6.5 g/dL (ref 6.5–8.1)

## 2019-06-14 LAB — PHOSPHORUS: Phosphorus: 4.3 mg/dL (ref 2.5–4.6)

## 2019-06-14 LAB — CBC
HCT: 42.2 % (ref 36.0–46.0)
Hemoglobin: 13.5 g/dL (ref 12.0–15.0)
MCH: 30.3 pg (ref 26.0–34.0)
MCHC: 32 g/dL (ref 30.0–36.0)
MCV: 94.6 fL (ref 80.0–100.0)
Platelets: 265 10*3/uL (ref 150–400)
RBC: 4.46 MIL/uL (ref 3.87–5.11)
RDW: 16.1 % — ABNORMAL HIGH (ref 11.5–15.5)
WBC: 7.7 10*3/uL (ref 4.0–10.5)
nRBC: 0 % (ref 0.0–0.2)

## 2019-06-14 LAB — MAGNESIUM: Magnesium: 1.9 mg/dL (ref 1.7–2.4)

## 2019-06-14 MED ORDER — ENSURE ENLIVE PO LIQD
237.0000 mL | Freq: Two times a day (BID) | ORAL | Status: DC
  Administered 2019-06-14 – 2019-06-15 (×3): 237 mL via ORAL

## 2019-06-14 MED ORDER — LORAZEPAM 1 MG PO TABS: 1.0000 mg | ORAL_TABLET | ORAL | Status: DC | PRN

## 2019-06-14 MED ORDER — LORAZEPAM 2 MG/ML PO CONC: 1.0000 mg | ORAL | Status: DC | PRN

## 2019-06-14 MED ORDER — TRAZODONE HCL 50 MG PO TABS: 25.0000 mg | ORAL_TABLET | Freq: Every evening | ORAL | Status: DC | PRN

## 2019-06-14 MED ORDER — MORPHINE SULFATE (CONCENTRATE) 10 MG/0.5ML PO SOLN: 5.0000 mg | ORAL | Status: DC | PRN

## 2019-06-14 MED ORDER — BIOTENE DRY MOUTH MT LIQD: 15.0000 mL | OROMUCOSAL | Status: DC | PRN

## 2019-06-14 MED ORDER — POLYVINYL ALCOHOL 1.4 % OP SOLN: 1.0000 [drp] | Freq: Four times a day (QID) | OPHTHALMIC | Status: DC | PRN

## 2019-06-14 MED ORDER — APIXABAN 2.5 MG PO TABS
2.5000 mg | ORAL_TABLET | Freq: Two times a day (BID) | ORAL | Status: DC
  Administered 2019-06-14 – 2019-06-15 (×3): 2.5 mg via ORAL
  Filled 2019-06-14 (×2): qty 1

## 2019-06-14 MED ORDER — ORAL CARE MOUTH RINSE
15.0000 mL | Freq: Two times a day (BID) | OROMUCOSAL | Status: DC
  Administered 2019-06-14 – 2019-06-15 (×3): 15 mL via OROMUCOSAL

## 2019-06-14 MED ORDER — LORAZEPAM 2 MG/ML IJ SOLN: 1.0000 mg | INTRAMUSCULAR | Status: DC | PRN

## 2019-06-14 NOTE — Progress Notes (Signed)
   Subjective: Pt somnolent on exam. Pt endorsed she was in no pain and has no complaints. Full interval history unable to be obtained 2/2 somnolence.   Objective:  Vital signs in last 24 hours: Vitals:   06/14/19 0059 06/14/19 0658 06/14/19 0742 06/14/19 1131  BP: (!) 157/88 (!) 170/81 (!) 169/71 (!) 161/98  Pulse: 60 73 61 66  Resp: 17 20 18 18   Temp: 97.9 F (36.6 C) 98.7 F (37.1 C) 98 F (36.7 C) 98.3 F (36.8 C)  TempSrc: Oral Oral Oral Oral  SpO2: 97% 98% 97% 97%  Weight:   46.5 kg   Height:       Constitutional: NAD, appears comfortable Cardiovascular: Irregularly irregular, no murmurs, rubs, or gallops.  Pulmonary/Chest: CTAB, no wheezes, rales, or rhonchi.  Abdominal: Soft, non tender Extremities: Warm and well perfused. No edema.  Neurological: Altered, mostly nonverbal, answers some yes/no questions  Assessment/Plan:  Active Problems:   Atrial fibrillation with RVR (HCC)   Sinus pause   Fall   Epigastric pain   Goals of care, counseling/discussion    Summary: Sherry [name] is an 55yoF with a h/o atrial fibrillation, stroke, hypertension, hyperlipidemia, CAD, CHF and COPD who presented due to ground-level fall in her bedroom at 4:30am on 10/12 likely 2/2 atrial fibrillation w/RVR.  Atrial Fibrillation w/RVR  Sick Sinus Syndrome: Pt in sinus rhythm with bigeminy on exam. Electrophysiology discussed pacemaker placement with healthcare decision maker; both EP and pt's daughter Sherry [name] agreed a pacemaker would not be beneficial for Sherry [name] and was out of line with her previously stated preferences. Aggressive intervention related to Sherry [name]'s sinus node dysfunction was discontinued. Sherry [name] daughter Sherry [name] was informed that prognosis was poor (likely days to weeks) due to pt's heart rhythm. She accepted this information and agreed that discharge to inpatient hospice for comfort care would be most beneficial and in line with Sherry [name] care goals. -Strict bed rest -Fall  precautions -Discharge to inpatient hospice, pending bed availabilty  Abdominal Pain: Resolved. Cause unclear. -Senokot 1 tablet, BID -Zofran 4mg  q8hrs prn nausea and vomiting  Stable Medical Conditions Discontinuing all non-essential, life sustaining medications at this time as per daughter, Cherene Altes wishes.  COPD: Stable HFrEF  Takotsubo cardiomyopathy: Stable Hypertension: Stable Hyperlipidemia: Stable  Dispo: Discharge to inpatient hospice pending bed availability-likely 10/14 or 10/15.  DVT PPX: Eliquis 2.5mg , d/c at discharge Code:DNR, confirmed w/pt daughter Diet: Regular   LOS: 1 day   Sherry [name], Medical Student 06/14/2019, 2:05 PM

## 2019-06-14 NOTE — Progress Notes (Signed)
Called and spoke with patients daughter, Sherry [name] at [phone] regarding the decision to advance with full comfort care measures for her mother Sherry [name] based on her irreversible cardiac condition. She stated that she understood and was in agreement with making her mother as comfortable as possible. She agrees to discontinuing all life prolonging measures and to allow her mother to pass in peace. She understood the issues involved and that we were working on bed placement at an inpatient hospice facility given that the patient has ~ 2 weeks or less. All questions were answered. This is in conjunction with Sherry [name]'s note dated for the same day.   Kathi Ludwig, MD [facility] Internal Medicine, PGY-3

## 2019-06-14 NOTE — TOC Progression Note (Signed)
Transition of Care [facility]) - Progression Note    Patient Details  Name: Sherry [name] MRN: [id number] Date of Birth: 08/19/1929  [facility] CM/SW Contact  Zenon Mayo, RN Phone Number: 06/14/2019, 2:51 PM  Clinical Narrative:    NCM received call from Venia Carbon with [facility] , asking if I had a consult for [facility] for this patient,  NCM informed her their is a residential hospice consult.  Anderson Malta will speak with the family and let this NCM know if a bed is available at [facility].         Expected Discharge Plan and Services                                                 Social Determinants of Health (SDOH) Interventions    Readmission Risk Interventions Readmission Risk Prevention Plan 06/02/2019  Transportation Screening Complete  PCP or Specialist Appt within 5-7 Days Complete  Home Care Screening Complete  Medication Review (RN CM) Complete  Some recent data might be hidden

## 2019-06-14 NOTE — Discharge Instructions (Signed)

## 2019-06-14 NOTE — Progress Notes (Addendum)
Initial Nutrition Assessment  RD working remotely.  DOCUMENTATION CODES:   Underweight  INTERVENTION:   -Ensure Enlive po BID, each supplement provides 350 kcal and 20 grams of protein  NUTRITION DIAGNOSIS:   Increased nutrient needs related to chronic illness(COPD) as evidenced by estimated needs.  GOAL:   Patient will meet greater than or equal to 90% of their needs  MONITOR:   PO intake, Supplement acceptance, Diet advancement, Labs, Weight trends, Skin, I & O's  REASON FOR ASSESSMENT:   Other (Comment)    ASSESSMENT:   Sherry [name] is an 57yoF with a h/o atrial fibrillation, stroke, hypertension, hyperlipidemia, CAD, CHF and COPD who presented due to ground-level fall in her bedroom around 4:30 this morning. At bedside patient is pleasant and alert to self and place but does not answer other questions. She does state she smokes 3 cigarettes per day.  Pt admitted with a-fib with RVR.  Reviewed I/O's: -1.9 L x 24 hours  UOP:1 .9 L x 24 hours  Attempted to speak with pt via phone, however, no answer. Unable to obtain further nutrition-related history at this time. Per MD notes, pt with waxing and waning mental status with memory deficits.   Per cardiology notes, pt and daughter do not want to pursue PPM. Plan for palliative care consult.   Pt with poor oral intake. Noted pt was unable to take medications last night and required a lot of effort to take medications this morning.   Reviewed wt hx; noted pt has experienced a 2.3% wt loss over the past 3 months. While this is not significant for time frame, this is concerning given poor oral intake and underweight status. Suspect pt with malnutrition, however, RD unable to identify at this time.  Labs reviewed.    Diet Order:   Diet Order            Diet Heart Room service appropriate? Yes; Fluid consistency: Thin  Diet effective now              EDUCATION NEEDS:   No education needs have been identified at this  time  Skin:  Skin Assessment: Reviewed RN Assessment  Last BM:  Unknown  Height:   Ht Readings from Last 1 Encounters:  06/13/19 5\' 6"  (1.676 m)    Weight:   Wt Readings from Last 1 Encounters:  06/14/19 46.5 kg    Ideal Body Weight:  59.1 kg  BMI:  Body mass index is 16.55 kg/m.  Estimated Nutritional Needs:   Kcal:  1150-1350  Protein:  55-70 grams  Fluid:  > 1.1 L    Sabirin Baray A. Jimmye Norman, RD, LDN, [place] Registered Dietitian II Certified Diabetes Care and Education Specialist Pager: [phone] After hours Pager: [phone]

## 2019-06-14 NOTE — Consult Note (Signed)
   [facility] CM Inpatient Consult   06/14/2019  JANAI MAUDLIN [birth year] [id number]   Patient screened for less than 30 days for unplanned hospitalizations to check if potential [place] [facility] services are needed.  Review of patient's medical record reveals patient is active with [facility].  Plan: Sign off as patient is managed in [facility] for her care management needs.    For questions contact:   [name], RN BSN [facility] Liaison  [phone] business mobile phone Toll free office [phone]  Fax number: [phone] [email or web address]

## 2019-06-14 NOTE — Plan of Care (Signed)
  Problem: Education: Goal: Knowledge of General Education information will improve Description: Including pain rating scale, medication(s)/side effects and non-pharmacologic comfort measures Outcome: Progressing   Problem: Health Behavior/Discharge Planning: Goal: Ability to manage health-related needs will improve Outcome: Progressing   Problem: Clinical Measurements: Goal: Ability to maintain clinical measurements within normal limits will improve Outcome: Progressing Goal: Will remain free from infection Outcome: Progressing Goal: Diagnostic test results will improve Outcome: Progressing Goal: Respiratory complications will improve Outcome: Progressing Goal: Cardiovascular complication will be avoided Outcome: Progressing   Problem: Activity: Goal: Risk for activity intolerance will decrease Outcome: Progressing   Problem: Nutrition: Goal: Adequate nutrition will be maintained Outcome: Progressing   Problem: Coping: Goal: Level of anxiety will decrease Outcome: Progressing   Problem: Elimination: Goal: Will not experience complications related to bowel motility Outcome: Progressing Goal: Will not experience complications related to urinary retention Outcome: Progressing   Problem: Pain Managment: Goal: General experience of comfort will improve Outcome: Progressing   Problem: Safety: Goal: Ability to remain free from injury will improve Outcome: Progressing   Problem: Skin Integrity: Goal: Risk for impaired skin integrity will decrease Outcome: Progressing   Problem: Education: Goal: Ability to demonstrate management of disease process will improve Outcome: Progressing   Problem: Activity: Goal: Capacity to carry out activities will improve Outcome: Progressing   Problem: Cardiac: Goal: Ability to achieve and maintain adequate cardiopulmonary perfusion will improve Outcome: Progressing

## 2019-06-14 NOTE — Progress Notes (Signed)
Pt was assessed by MD at bedside for A Fib and recurrent pauses. EKG was attained. After MD had left the floor pt had a 8.23 sec pause. MD has been made aware. Awaiting orders. Will continue to monitor.

## 2019-06-14 NOTE — Progress Notes (Signed)
Patient continues to be in A.fib with RVR up to 140s and intermittent episodes of sinus pauses that have been prolonging, most recent pause of 8.23 seconds. Discussed with cardiology. Recommendations to discontinue metoprolol succinate as that would worsen the sinus pauses. If patient becomes symptomatic secondary to sinus pauses, can give atropine 0.5mg . A.fib with RVR is permissible at this point. Will continue to monitor.

## 2019-06-14 NOTE — Consult Note (Addendum)
Cardiology Consultation:   Patient ID: Sherry [name] MRN: [id number]; DOB: 1929-06-11  Admit date: 06/13/2019 Date of Consult: 06/14/2019  Primary Care Provider: Darrow BussingKoirala, Dibas, MD Primary Cardiologist: Tonny BollmanMichael Cooper, MD (prior hospital stay) Primary Electrophysiologist:  Dr. Ladona Ridgelaylor has seen previously in-patient   Patient Profile:   Sherry [name] is a 85 y.o. female with a hx of COPD, HTN, HLD, CAD, recurrent UTIs, some degree of what sounds like baseline dementia with waxing/waning mental status and memory deficits, and AFib who is being seen today for the evaluation of recurrent post termination pauses, tachy-brady, reconsider PPM at the request of Dr. Tresa EndoKelly.  History of Present Illness:   Sherry [name] was seen July 2008 by Dr. Ladona Ridgelaylor during a hospital stay with recurrent falls found with stroke (not previously on a/c 2/2 fraility/risk.  During this stay she was started on a/c at neurology recs.  On telemetry she was noted with AFib RVR with pauses of up to 5 seconds.  Dr. Ladona Ridgelaylor noted nocturnal bradycardia and brief pauses while awake, none particularly long and recommended oral dilt to be continued During same stay, EP recalled for AFib RVR, nocturnal pauses and daytime SB/junctional rhythm 40's No clear symptoms, noting some dementia/AMS, Dr. Ladona Ridgelaylor felt given no symptoms and her long pauses are at nightime. Her combination of dementia and failure to thrive and lack of symptoms make PPM insertion contra-indicated. She is a very poor candidate at best for PPM  She had a hospitalization 9/28 with NSTEMI, LVEF was down from normal in July to 25-30% (suspect stress CM) Trop felt to be demand ischemia with febrile illness (?pneumonia) She was on/off amio for AFib though stopped with pauses and bradycardia, planned for low dose rate control medication strategy (followed by cards), her CCB changed to toprol given new CM Discharged 06/02/2019  Readmitted yesterday Came 2/2 AMS, fall, c/o abd  pain and nausea (sounded somewhat chronic), and more confused by family reports post fall CT noted no new/acute findings In the ER she was found in rapid AFib given lopressor 2.5mg  that resulted in what was described as several pauses 4-9seconds in duration with RN reports of possible fleeting LOC, and return to AFib 110's Cardiology consulted Given single dose of lasix with some JVD, crackles, resumed on her Toprol 1/2 dose 12.5mg  daily EP is asked to reconsider PPM implant for tachy brady, her Eliquis held for our evaluation  LABS K+ 4.2 Mag 1.9 BUN/Creat 18/1.11> 22/1.29 BNP 1426 HS Trop 37, 34 Lactic acid 2.3 > 1.7 WBC 6.8 HH 14/45 Plys 271 TSH 2.509    Heart Pathway Score:     Past Medical History:  Diagnosis Date   Atrial fibrillation (HCC)    CAD (coronary artery disease)    Chronic diastolic CHF (congestive heart failure) (HCC)    COPD (chronic obstructive pulmonary disease) (HCC)    Hypercholesteremia    Hypertension    Ischemic stroke (HCC)    Moderate tricuspid regurgitation     Past Surgical History:  Procedure Laterality Date   CORONARY ANGIOPLASTY WITH STENT PLACEMENT     HIP SURGERY       Home Medications:  Prior to Admission medications   Medication Sig Start Date End Date Taking? Authorizing Provider  acetaminophen (TYLENOL) 500 MG tablet Take 2 tablets (1,000 mg total) by mouth every 8 (eight) hours as needed for mild pain, fever or headache. 10/13/17  Yes Benjamine MolaVann, Jessica U, DO  apixaban (ELIQUIS) 2.5 MG TABS tablet Take 1 tablet (2.5 mg  total) by mouth 2 (two) times daily. 03/15/19  Yes Standley Brooking, MD  atorvastatin (LIPITOR) 40 MG tablet Take 40 mg by mouth daily.  12/22/16  Yes [provider]  escitalopram (LEXAPRO) 5 MG tablet Take 5 mg by mouth daily. 05/18/19  Yes [provider]  ferrous sulfate 325 (65 FE) MG tablet Take 1 tablet (325 mg total) by mouth daily with breakfast. 03/16/19  Yes Standley Brooking, MD    hydrOXYzine (ATARAX/VISTARIL) 50 MG tablet Take 50 mg by mouth at bedtime.  01/20/19  Yes [provider]  Ipratropium-Albuterol (COMBIVENT) 20-100 MCG/ACT AERS respimat Inhale 1 puff into the lungs every 6 (six) hours as needed for wheezing or shortness of breath.   Yes [provider]  ipratropium-albuterol (DUONEB) 0.5-2.5 (3) MG/3ML SOLN Take 3 mLs by nebulization every 4 (four) hours as needed (sob/wheezing). 10/13/17  Yes Marlin Canary U, DO  metoprolol succinate (TOPROL-XL) 25 MG 24 hr tablet Take 1 tablet (25 mg total) by mouth daily. 06/03/19  Yes Narda Bonds, MD  telmisartan (MICARDIS) 40 MG tablet Take 40 mg by mouth daily. 12/31/18  Yes [provider]    Inpatient Medications: Scheduled Meds:  atorvastatin  40 mg Oral Daily   irbesartan  75 mg Oral Daily   nicotine  7 mg Transdermal QHS   senna-docusate  1 tablet Oral BID   Continuous Infusions:  PRN Meds: ipratropium-albuterol, ondansetron (ZOFRAN) IV  Allergies:    Allergies  Allergen Reactions   Amiodarone     Social History:   Social History   Socioeconomic History   Marital status: Widowed    Spouse name: Not on file   Number of children: Not on file   Years of education: Not on file   Highest education level: Not on file  Occupational History   Not on file  Social Needs   Financial resource strain: Not on file   Food insecurity    Worry: Not on file    Inability: Not on file   Transportation needs    Medical: Not on file    Non-medical: Not on file  Tobacco Use   Smoking status: Current Every Day Smoker    Packs/day: 1.00    Types: Cigarettes   Smokeless tobacco: Never Used  Substance and Sexual Activity   Alcohol use: No    Frequency: Never   Drug use: No   Sexual activity: Not on file  Lifestyle   Physical activity    Days per week: Not on file    Minutes per session: Not on file   Stress: Not on file  Relationships   Social connections     Talks on phone: Not on file    Gets together: Not on file    Attends religious service: Not on file    Active member of club or organization: Not on file    Attends meetings of clubs or organizations: Not on file    Relationship status: Not on file   Intimate partner violence    Fear of current or ex partner: Not on file    Emotionally abused: Not on file    Physically abused: Not on file    Forced sexual activity: Not on file  Other Topics Concern   Not on file  Social History Narrative   Not on file    Family History:   Family History  Problem Relation Age of Onset   Heart attack Mother  ROS:  Please see the history of present illness.  All other ROS reviewed and negative.     Physical Exam/Data:   Vitals:   06/13/19 2133 06/14/19 0059 06/14/19 0658 06/14/19 0742  BP: (!) 130/102 (!) 157/88 (!) 170/81 (!) 169/71  Pulse: (!) 119 60 73 61  Resp:  17 20 18   Temp:  97.9 F (36.6 C) 98.7 F (37.1 C) 98 F (36.7 C)  TempSrc:  Oral Oral Oral  SpO2:  97% 98% 97%  Weight:    46.5 kg  Height:        Intake/Output Summary (Last 24 hours) at 06/14/2019 0938 Last data filed at 06/14/2019 0657 Gross per 24 hour  Intake --  Output 1900 ml  Net -1900 ml   Last 3 Weights 06/14/2019 06/13/2019 06/02/2019  Weight (lbs) 102 lb 8.2 oz 110 lb 113 lb 8.6 oz  Weight (kg) 46.5 kg 49.896 kg 51.5 kg     Body mass index is 16.55 kg/m.  General:  Very thin, chronically ill appearing, looks comfortable in no distress HEENT: normal Lymph: no adenopathy Neck: unable to assess well Endocrine:  No thryomegaly Vascular: No carotid bruits Cardiac:  RRR; extrasystoles noted, 1-2//6 SM, no gallops or rubs Lungs:  CTA b/l to normal respirations b/l, no wheezing, rhonchi or rales  Abd: soft, nontender  Ext: no edema Musculoskeletal:  Age appropriate> advanced atrophy Skin: warm and dry  Neuro:  Pt is somewhat lethargic, wakes minimally, offers only a few words here and there,  squeezes my hands lightly when asked Psych:  Unable to asses   EKG:  The EKG was personally reviewed and demonstrates:    AFib 118bpm, ST/T changes inf/lat AFib 114 similar to prior SR, PACs, junctional beats, 90bpm, T changes simiar or less in comparison to prior hospital EKGs in SR AF 122bpm, appears similar to others SR, PVCs, similar T changes   Telemetry:  Telemetry was personally reviewed and demonstrates:   SR currently with PACs, PVCs, since maintaining SR today she has had frequent PVCs, often bigemeny  She has had AFib with RVR 130's-150's, intermittently SR, she has had pauses as long 8.5 seconds, short as 3 seconds, some result in SR others do not,  She has had junctional bradycardia as well  Relevant CV Studies:  Echocardiogram 05/30/2019 IMPRESSIONS 1. Left ventricular ejection fraction, by visual estimation, is 25 to 30%. The left ventricle has normal function. Normal left ventricular size. There is severely increased left ventricular hypertrophy. 2. Entire apex, mid and apical inferior wall, mid and apical inferolateral wall, apical anterior and apical septal walls are abnormal. 3. Global right ventricle has normal systolic function.The right ventricular size is normal. No increase in right ventricular wall thickness. 4. Left atrial size was normal. 5. Right atrial size was normal. 6. The mitral valve is normal in structure. Mild mitral valve regurgitation. No evidence of mitral stenosis. 7. The tricuspid valve is normal in structure. Tricuspid valve regurgitation moderate. 8. The aortic valve is tricuspid Aortic valve regurgitation is mild to moderate by color flow Doppler. Mild to moderate aortic valve sclerosis/calcification without any evidence of aortic stenosis. 9. The pulmonic valve was normal in structure. Pulmonic valve regurgitation is mild by color flow Doppler. 10. Moderately elevated pulmonary artery systolic pressure with PASP estimated at  44mmHg. 11. The inferior vena cava is dilated in size with <50% respiratory variability, suggesting right atrial pressure of 15 mmHg.   Echo 03/06/2019: EF 60-65%  Laboratory Data:  High Sensitivity  Troponin:   Recent Labs  Lab 05/30/19 0548 05/30/19 1113 05/31/19 0821 06/13/19 1113 06/13/19 1444  TROPONINIHS 476* 290* 104* 37* 34*     Chemistry Recent Labs  Lab 06/13/19 0647 06/14/19 0433  NA 139 140  K 4.2 4.4  CL 104 103  CO2 24 24  GLUCOSE 98 64*  BUN 18 22  CREATININE 1.11* 1.29*  CALCIUM 9.4 9.3  GFRNONAA 44* 37*  GFRAA 51* 43*  ANIONGAP 11 13    Recent Labs  Lab 06/13/19 0647 06/14/19 0433  PROT 7.1 6.5  ALBUMIN 3.7 3.6  AST 27 32  ALT 24 29  ALKPHOS 83 76  BILITOT 0.9 1.3*   Hematology Recent Labs  Lab 06/13/19 0647 06/14/19 0433  WBC 6.8 7.7  RBC 4.74 4.46  HGB 14.1 13.5  HCT 45.2 42.2  MCV 95.4 94.6  MCH 29.7 30.3  MCHC 31.2 32.0  RDW 16.2* 16.1*  PLT 271 265   BNP Recent Labs  Lab 06/13/19 1114  BNP 1,426.9*    DDimer No results for input(s): DDIMER in the last 168 hours.   Radiology/Studies:  Dg Chest 2 View Result Date: 06/13/2019 CLINICAL DATA:  Status post fall. EXAM: CHEST - 2 VIEW COMPARISON:  June 01, 2019. FINDINGS: Stable cardiomegaly. No pneumothorax is noted. Atherosclerosis of thoracic aorta is noted. Mild bibasilar subsegmental atelectasis or edema are noted. No significant pleural effusion is noted. Bony thorax is unremarkable. IMPRESSION: Mild bibasilar atelectasis or edema is noted. Aortic Atherosclerosis (ICD10-I70.0). Electronically Signed   By: Lupita Raider M.D.   On: 06/13/2019 07:42    Dg Lumbar Spine Complete Result Date: 06/13/2019 CLINICAL DATA:  Low back pain after fall. EXAM: LUMBAR SPINE - COMPLETE 4+ VIEW COMPARISON:  September 08, 2017. FINDINGS: Stable old T12 compression fracture is noted. No acute fracture or spondylolisthesis is noted. Severe degenerative disc disease is noted at L2-3 with  anterior osteophyte formation. Mild degenerative disc disease is also noted at L1-2, L3-4 and L4-5 with anterior osteophyte formation. Diffuse osteopenia is noted. Calcified abdominal aortic aneurysm is noted. IMPRESSION: Multilevel degenerative disc disease is noted. Old T12 fracture is noted. No acute fracture or spondylolisthesis is noted. Calcified abdominal aortic aneurysm is noted. Ultrasound is recommended for further evaluation. Electronically Signed   By: Lupita Raider M.D.   On: 06/13/2019 07:44     Ct Head Wo Contrast Result Date: 06/13/2019 CLINICAL DATA:  Fall with right temporal headache. EXAM: CT HEAD WITHOUT CONTRAST CT CERVICAL SPINE WITHOUT CONTRAST TECHNIQUE: Multidetector CT imaging of the head and cervical spine was performed following the standard protocol without intravenous contrast. Multiplanar CT image reconstructions of the cervical spine were also generated. COMPARISON:  03/05/2019 FINDINGS: CT HEAD FINDINGS Brain: No evidence of acute infarction, hemorrhage, hydrocephalus, extra-axial collection or mass lesion/mass effect. Moderate remote left occipital infarct. Confluent microvascular ischemic low-density in the cerebral white matter. Ventriculomegaly primarily attributed to volume loss. Vascular: No hyperdense vessel or unexpected calcification. Skull: Negative for fracture Sinuses/Orbits: Bilateral cataract resection. No evidence of injury. CT CERVICAL SPINE FINDINGS Alignment: No traumatic malalignment. Skull base and vertebrae: No acute fracture.  C3 and C4 laminectomy. Soft tissues and spinal canal: No prevertebral fluid or swelling. No visible canal hematoma. Disc levels: Generalized disc narrowing with moderate lower cervical endplate ridging. Multilevel facet spurring bulkier on the right. Upper chest: Layering pleural effusions, small where visualized period. Atherosclerosis. IMPRESSION: 1. No evidence of acute intracranial or cervical spine injury. 2. Atrophy and  advanced  chronic small vessel ischemia. Remote left occipital infarct. Electronically Signed   By: Marnee Spring M.D.   On: 06/13/2019 08:00     Ct Angio Chest/abd/pel For Dissection W And/or Wo Contrast Result Date: 06/13/2019 CLINICAL DATA:  Acute chest pain, back pain EXAM: CT ANGIOGRAPHY CHEST, ABDOMEN AND PELVIS TECHNIQUE: Multidetector CT imaging through the chest, abdomen and pelvis was performed using the standard protocol during bolus administration of intravenous contrast. Multiplanar reconstructed images and MIPs were obtained and reviewed to evaluate the vascular anatomy. CONTRAST:  11mL OMNIPAQUE IOHEXOL 350 MG/ML SOLN COMPARISON:  CT chest 10/12/2017 FINDINGS: Technical note: Examination quality is significantly degraded by extensive beam hardening and streak artifact from patient's arms with multiple metallic rings overlying the mid abdomen. Additional streak artifact from left hip arthroplasty hardware. Respiratory motion artifact degrades evaluation of the chest. CTA CHEST FINDINGS Cardiovascular: The thoracic aorta is well opacified. Borderline enlargement of the ascending thoracic aorta, unchanged from prior. No thoracic aortic dissection. There is extensive calcified and noncalcified atherosclerotic plaque throughout the aorta. Prominent coronary artery calcifications. Pulmonary vasculature is also well opacified without central filling defect. Heart size is mildly enlarged without pericardial effusion. Mediastinum/Nodes: Negative for axillary, mediastinal, or hilar lymphadenopathy. No discrete thyroid nodule. Trachea unremarkable. Esophagus grossly unremarkable. Lungs/Pleura: Small bilateral pleural effusions with associated compressive atelectasis. Mild centrilobular emphysema with upper lobe predominance. Scattered calcified granulomas. No focal airspace consolidation. No pneumothorax. Musculoskeletal: Chronic superior endplate compression deformity of T12. Multilevel degenerative  changes of the thoracic spine. No acute osseous finding. Review of the MIP images confirms the above findings. CTA ABDOMEN AND PELVIS FINDINGS VASCULAR Aorta: Ectasia of the proximal abdominal aorta measuring 2.8 cm in diameter. Aorta is tortuous. Infrarenal abdominal aorta measuring 2.8 cm in diameter. Extensive calcified and noncalcified atherosclerotic plaque throughout the aorta. No dissection is evident within the limitations of this exam. Celiac: Patent without high-grade stenosis. SMA: Focal calcified plaque at the origin of the SMA results in less than 50% stenosis. SMA is otherwise well opacified. Renals: Bilateral renal arteries patent without high-grade stenosis. IMA: IMA not well-visualized. Inflow: Prior left common iliac artery and right external iliac artery stents. 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 Extensive atherosclerotic plaques. Veins: Poorly evaluated. Review of the MIP images confirms the above findings. NON-VASCULAR Hepatobiliary: Reflux of contrast within the hepatic veins. No discrete hepatic abnormality. Gallbladder grossly unremarkable. Pancreas: Poorly evaluated.  No gross abnormality. Spleen: Scattered calcified granulomas.  Otherwise unremarkable. Adrenals/Urinary Tract: Grossly unremarkable adrenals. No hydronephrosis. Poorly evaluated rounded 2.3 cm region within the midpole of the right kidney. No hydronephrosis. Left kidney grossly unremarkable. No obvious bladder abnormality. Stomach/Bowel: Stomach and bowel grossly unremarkable. No dilated loops of bowel. Mild rectosigmoid colonic stool volume. Lymphatic: No obvious enlarged abdominopelvic lymph nodes. Reproductive: 3.0 cm rounded lesion within the left adnexa (series 7, image 230), poorly characterized. Uterus grossly unremarkable. Other: No ascites.  No abdominal wall hernia. Musculoskeletal: Prior left hip ORIF. Degenerative changes of the lumbar spine. No fractures. Review of the MIP images confirms the above  findings. IMPRESSION: 1. Significantly limited exam.  See above discussion. 2. No evidence of aortic dissection within the limitations of this exam. 3. Ectasia of the thoracic and abdominal aorta at risk for aneurysm development. Recommend followup by abdominal ultrasound in 5 years. This recommendation follows ACR consensus guidelines: White Paper of the ACR Incidental Findings Committee II on Vascular Findings. J Am Coll Radiol 2013; 10:789-794. Aortic aneurysm NOS (ICD10-I71.9) 4. Advanced atherosclerotic disease. 5.  Small bilateral pleural effusions. 6. Poorly evaluated low-density 2.3 cm lesion within the right kidney. Nonemergent renal ultrasound can be performed to further evaluate. Electronically Signed   By: Duanne Guess M.D.   On: 06/13/2019 12:47    Assessment and Plan:   1. Tachy-brady      She has previously been felt not to be a candidate for PPM as discussed above, with primarily nocturnal brady, comorbid conditions of dementia and failure to thrive as well.  The patient this am wakes only  minimally, is in SR (w/PVCs), BP stable/elevated Her daughter is at bedside, her daughter a CMA and the patient was as well as CMA.  She tells me that she and her mother have had many conversations about EOL and her mom's wishes She tells me she does not feel her Mom would want to pursue pacing We discussed that pacing would help prevent slow HR allowing better treatment of her rapid rates, though was not certain/likely would not improve her quality of life, progression of dementia, etc..  AT this time she feels her Mom would not want PPM, surgeries, procedures. I  offerred to have our palliative team see them and she would like that very much.  I discussed this with IM team resident they will call palliative  OK to resume her Eliquis from out perspective with no procedure planned and diet (when she is taking PO reliably) In d/w RN, she was unable to take meds last night, with much effort was  able t get he to take them this AM  I will discuss with Dr. Johney Frame, he will see her later today     For questions or updates, please contact [facility] Please consult [email or web address] for contact info under     Signed, Sheilah Pigeon, PA-C  06/14/2019 9:38 AM   I have seen, examined the patient, and reviewed the above assessment and plan.  Changes to above are made where necessary.  On exam, RRR.  Had a long discussion with daughter. She is very clear that the patient would not want any EP procedures. Recommend discontinuing telemetry. Agree with palliative consult. Electrophysiology team to see as needed while here. Please call with questions.  Co Sign: Hillis Range, MD 06/14/2019 5:27 PM

## 2019-06-14 NOTE — Progress Notes (Signed)
Progress Note  Patient Name: Sherry [name] Date of Encounter: 06/14/2019  Primary Cardiologist:  Tonny Bollman, MD  Subjective   Arouses to verbal stimulus, confused, limited participation in exam  Inpatient Medications    Scheduled Meds:  apixaban  2.5 mg Oral BID   feeding supplement (ENSURE ENLIVE)  237 mL Oral BID BM   mouth rinse  15 mL Mouth Rinse BID   senna-docusate  1 tablet Oral BID   Continuous Infusions:  PRN Meds: antiseptic oral rinse, LORazepam **OR** LORazepam **OR** LORazepam, morphine CONCENTRATE **OR** morphine CONCENTRATE, ondansetron (ZOFRAN) IV, polyvinyl alcohol, traZODone   Vital Signs    Vitals:   06/14/19 0059 06/14/19 0658 06/14/19 0742 06/14/19 1131  BP: (!) 157/88 (!) 170/81 (!) 169/71 (!) 161/98  Pulse: 60 73 61 66  Resp: 17 20 18 18   Temp: 97.9 F (36.6 C) 98.7 F (37.1 C) 98 F (36.7 C) 98.3 F (36.8 C)  TempSrc: Oral Oral Oral Oral  SpO2: 97% 98% 97% 97%  Weight:   46.5 kg   Height:        Intake/Output Summary (Last 24 hours) at 06/14/2019 1402 Last data filed at 06/14/2019 0935 Gross per 24 hour  Intake 240 ml  Output 1900 ml  Net -1660 ml   Filed Weights   06/13/19 0640 06/14/19 0742  Weight: 49.9 kg 46.5 kg   Last Weight  Most recent update: 06/14/2019  8:10 AM   Weight  46.5 kg (102 lb 8.2 oz)           Weight change:    Telemetry    No longer on tele- Personally Reviewed  ECG    None today - Personally Reviewed  Physical Exam   General: Frail, elderly female appearing in no acute distress. Head: Normocephalic, atraumatic.  Neck: Supple without bruits, JVD approximately 9 cm. Lungs:  Resp regular and unlabored, CTA. Heart: RRR, S1, S2, no S3, S4, 2/6 murmur; no rub. Abdomen: Soft, non-tender, non-distended with normoactive bowel sounds. No hepatomegaly. No rebound/guarding. No obvious abdominal masses. Extremities: No clubbing, cyanosis, no edema. Distal pedal pulses are 2+  bilaterally. Neuro:  oriented X 1. Moves all extremities spontaneously.  Labs    Hematology Recent Labs  Lab 06/13/19 0647 06/14/19 0433  WBC 6.8 7.7  RBC 4.74 4.46  HGB 14.1 13.5  HCT 45.2 42.2  MCV 95.4 94.6  MCH 29.7 30.3  MCHC 31.2 32.0  RDW 16.2* 16.1*  PLT 271 265    Chemistry Recent Labs  Lab 06/13/19 0647 06/14/19 0433  NA 139 140  K 4.2 4.4  CL 104 103  CO2 24 24  GLUCOSE 98 64*  BUN 18 22  CREATININE 1.11* 1.29*  CALCIUM 9.4 9.3  PROT 7.1 6.5  ALBUMIN 3.7 3.6  AST 27 32  ALT 24 29  ALKPHOS 83 76  BILITOT 0.9 1.3*  GFRNONAA 44* 37*  GFRAA 51* 43*  ANIONGAP 11 13     High Sensitivity Troponin:   Recent Labs  Lab 05/30/19 0548 05/30/19 1113 05/31/19 0821 06/13/19 1113 06/13/19 1444  TROPONINIHS 476* 290* 104* 37* 34*     BNP Recent Labs  Lab 06/13/19 1114  BNP 1,426.9*    Lab Results  Component Value Date   TSH 2.509 06/13/2019    Magnesium  Date Value Ref Range Status  06/14/2019 1.9 1.7 - 2.4 mg/dL Final    Comment:    Performed at [facility], [address] [place], [place]  [address]     Radiology    Dg Chest 2 View  Result Date: 06/13/2019 CLINICAL DATA:  Status post fall. EXAM: CHEST - 2 VIEW COMPARISON:  June 01, 2019. FINDINGS: Stable cardiomegaly. No pneumothorax is noted. Atherosclerosis of thoracic aorta is noted. Mild bibasilar subsegmental atelectasis or edema are noted. No significant pleural effusion is noted. Bony thorax is unremarkable. IMPRESSION: Mild bibasilar atelectasis or edema is noted. Aortic Atherosclerosis (ICD10-I70.0). Electronically Signed   By: Marijo Conception M.D.   On: 06/13/2019 07:42   Dg Lumbar Spine Complete  Result Date: 06/13/2019 CLINICAL DATA:  Low back pain after fall. EXAM: LUMBAR SPINE - COMPLETE 4+ VIEW COMPARISON:  September 08, 2017. FINDINGS: Stable old T12 compression fracture is noted. No acute fracture or spondylolisthesis is noted. Severe degenerative disc disease is  noted at L2-3 with anterior osteophyte formation. Mild degenerative disc disease is also noted at L1-2, L3-4 and L4-5 with anterior osteophyte formation. Diffuse osteopenia is noted. Calcified abdominal aortic aneurysm is noted. IMPRESSION: Multilevel degenerative disc disease is noted. Old T12 fracture is noted. No acute fracture or spondylolisthesis is noted. Calcified abdominal aortic aneurysm is noted. Ultrasound is recommended for further evaluation. Electronically Signed   By: Marijo Conception M.D.   On: 06/13/2019 07:44   Ct Head Wo Contrast  Result Date: 06/13/2019 CLINICAL DATA:  Fall with right temporal headache. EXAM: CT HEAD WITHOUT CONTRAST CT CERVICAL SPINE WITHOUT CONTRAST TECHNIQUE: Multidetector CT imaging of the head and cervical spine was performed following the standard protocol without intravenous contrast. Multiplanar CT image reconstructions of the cervical spine were also generated. COMPARISON:  03/05/2019 FINDINGS: CT HEAD FINDINGS Brain: No evidence of acute infarction, hemorrhage, hydrocephalus, extra-axial collection or mass lesion/mass effect. Moderate remote left occipital infarct. Confluent microvascular ischemic low-density in the cerebral white matter. Ventriculomegaly primarily attributed to volume loss. Vascular: No hyperdense vessel or unexpected calcification. Skull: Negative for fracture Sinuses/Orbits: Bilateral cataract resection. No evidence of injury. CT CERVICAL SPINE FINDINGS Alignment: No traumatic malalignment. Skull base and vertebrae: No acute fracture.  C3 and C4 laminectomy. Soft tissues and spinal canal: No prevertebral fluid or swelling. No visible canal hematoma. Disc levels: Generalized disc narrowing with moderate lower cervical endplate ridging. Multilevel facet spurring bulkier on the right. Upper chest: Layering pleural effusions, small where visualized period. Atherosclerosis. IMPRESSION: 1. No evidence of acute intracranial or cervical spine injury. 2.  Atrophy and advanced chronic small vessel ischemia. Remote left occipital infarct. Electronically Signed   By: Monte Fantasia M.D.   On: 06/13/2019 08:00   Ct Cervical Spine Wo Contrast  Result Date: 06/13/2019 CLINICAL DATA:  Fall with right temporal headache. EXAM: CT HEAD WITHOUT CONTRAST CT CERVICAL SPINE WITHOUT CONTRAST TECHNIQUE: Multidetector CT imaging of the head and cervical spine was performed following the standard protocol without intravenous contrast. Multiplanar CT image reconstructions of the cervical spine were also generated. COMPARISON:  03/05/2019 FINDINGS: CT HEAD FINDINGS Brain: No evidence of acute infarction, hemorrhage, hydrocephalus, extra-axial collection or mass lesion/mass effect. Moderate remote left occipital infarct. Confluent microvascular ischemic low-density in the cerebral white matter. Ventriculomegaly primarily attributed to volume loss. Vascular: No hyperdense vessel or unexpected calcification. Skull: Negative for fracture Sinuses/Orbits: Bilateral cataract resection. No evidence of injury. CT CERVICAL SPINE FINDINGS Alignment: No traumatic malalignment. Skull base and vertebrae: No acute fracture.  C3 and C4 laminectomy. Soft tissues and spinal canal: No prevertebral fluid or swelling. No visible canal hematoma. Disc levels: Generalized disc narrowing with moderate lower cervical  endplate ridging. Multilevel facet spurring bulkier on the right. Upper chest: Layering pleural effusions, small where visualized period. Atherosclerosis. IMPRESSION: 1. No evidence of acute intracranial or cervical spine injury. 2. Atrophy and advanced chronic small vessel ischemia. Remote left occipital infarct. Electronically Signed   By: Marnee Spring M.D.   On: 06/13/2019 08:00   Ct Angio Chest/abd/pel For Dissection W And/or Wo Contrast  Result Date: 06/13/2019 CLINICAL DATA:  Acute chest pain, back pain EXAM: CT ANGIOGRAPHY CHEST, ABDOMEN AND PELVIS TECHNIQUE: Multidetector CT  imaging through the chest, abdomen and pelvis was performed using the standard protocol during bolus administration of intravenous contrast. Multiplanar reconstructed images and MIPs were obtained and reviewed to evaluate the vascular anatomy. CONTRAST:  80mL OMNIPAQUE IOHEXOL 350 MG/ML SOLN COMPARISON:  CT chest 10/12/2017 FINDINGS: Technical note: Examination quality is significantly degraded by extensive beam hardening and streak artifact from patient's arms with multiple metallic rings overlying the mid abdomen. Additional streak artifact from left hip arthroplasty hardware. Respiratory motion artifact degrades evaluation of the chest. CTA CHEST FINDINGS Cardiovascular: The thoracic aorta is well opacified. Borderline enlargement of the ascending thoracic aorta, unchanged from prior. No thoracic aortic dissection. There is extensive calcified and noncalcified atherosclerotic plaque throughout the aorta. Prominent coronary artery calcifications. Pulmonary vasculature is also well opacified without central filling defect. Heart size is mildly enlarged without pericardial effusion. Mediastinum/Nodes: Negative for axillary, mediastinal, or hilar lymphadenopathy. No discrete thyroid nodule. Trachea unremarkable. Esophagus grossly unremarkable. Lungs/Pleura: Small bilateral pleural effusions with associated compressive atelectasis. Mild centrilobular emphysema with upper lobe predominance. Scattered calcified granulomas. No focal airspace consolidation. No pneumothorax. Musculoskeletal: Chronic superior endplate compression deformity of T12. Multilevel degenerative changes of the thoracic spine. No acute osseous finding. Review of the MIP images confirms the above findings. CTA ABDOMEN AND PELVIS FINDINGS VASCULAR Aorta: Ectasia of the proximal abdominal aorta measuring 2.8 cm in diameter. Aorta is tortuous. Infrarenal abdominal aorta measuring 2.8 cm in diameter. Extensive calcified and noncalcified atherosclerotic  plaque throughout the aorta. No dissection is evident within the limitations of this exam. Celiac: Patent without high-grade stenosis. SMA: Focal calcified plaque at the origin of the SMA results in less than 50% stenosis. SMA is otherwise well opacified. Renals: Bilateral renal arteries patent without high-grade stenosis. IMA: IMA not well-visualized. Inflow: Prior left common iliac artery and right external iliac artery stents. 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 Extensive atherosclerotic plaques. Veins: Poorly evaluated. Review of the MIP images confirms the above findings. NON-VASCULAR Hepatobiliary: Reflux of contrast within the hepatic veins. No discrete hepatic abnormality. Gallbladder grossly unremarkable. Pancreas: Poorly evaluated.  No gross abnormality. Spleen: Scattered calcified granulomas.  Otherwise unremarkable. Adrenals/Urinary Tract: Grossly unremarkable adrenals. No hydronephrosis. Poorly evaluated rounded 2.3 cm region within the midpole of the right kidney. No hydronephrosis. Left kidney grossly unremarkable. No obvious bladder abnormality. Stomach/Bowel: Stomach and bowel grossly unremarkable. No dilated loops of bowel. Mild rectosigmoid colonic stool volume. Lymphatic: No obvious enlarged abdominopelvic lymph nodes. Reproductive: 3.0 cm rounded lesion within the left adnexa (series 7, image 230), poorly characterized. Uterus grossly unremarkable. Other: No ascites.  No abdominal wall hernia. Musculoskeletal: Prior left hip ORIF. Degenerative changes of the lumbar spine. No fractures. Review of the MIP images confirms the above findings. IMPRESSION: 1. Significantly limited exam.  See above discussion. 2. No evidence of aortic dissection within the limitations of this exam. 3. Ectasia of the thoracic and abdominal aorta at risk for aneurysm development. Recommend followup by abdominal ultrasound in 5 years.  This recommendation follows ACR consensus guidelines: White Paper of the  ACR Incidental Findings Committee II on Vascular Findings. J Am Coll Radiol 2013; 10:789-794. Aortic aneurysm NOS (ICD10-I71.9) 4. Advanced atherosclerotic disease. 5. Small bilateral pleural effusions. 6. Poorly evaluated low-density 2.3 cm lesion within the right kidney. Nonemergent renal ultrasound can be performed to further evaluate. Electronically Signed   By: Duanne GuessNicholas  Plundo M.D.   On: 06/13/2019 12:47     Cardiac Studies   None  Patient Profile     85 y.o. female w/ hx COPD, hypertension, hyperlipidemia, CAD, probable stress cardiomyopathy with EF 25-30%, paroxysmal atrial fibrillation and CVA, was admitted 10/12 with acute mental status after fall and A. fib, RVR.  Assessment & Plan    1.  Atrial fibrillation, RVR and posttermination sinus pauses: -EP has seen Ms. Ronne BinningWray and no pacemaker is planned.  This is in concordance with the patient and family's wishes. -A palliative care consult has been called -Because of pauses up to 8 seconds, no rate lowering medications are indicated. She is still on the Eliquis, but if the plan is for comfort care, would discontinue this  2.  Hypertension: -She was put on irbesartan at 75 mg daily but this was discontinued. - Beta-blocker was discontinued for pauses. -She is not currently on any blood pressure lowering medications -We will leave to IM.  Otherwise, per IM Active Problems:   Atrial fibrillation with RVR (HCC)   Sinus pause   Fall   Epigastric pain   Goals of care, counseling/discussion    Melida QuitterSigned, Malaya Cagley , PA-C 2:02 PM 06/14/2019 Pager: [phone]

## 2019-06-14 NOTE — Progress Notes (Signed)
Manufacturing engineer (ACC)/[facility] Note:  Received request from [place] for family interest in [facility]  Chart reviewed. Spoke with patient daughter Lelon Frohlich to support, confirm interest and explain services.   Unfortunately, [facility] does not have a bed to offer today but a [facility] will follow up daily or sooner if bed becomes available. Made TOC CM/SW aware of above.   Gar Ponto, RN [facility] Liaison  [place] are on AMION

## 2019-06-15 DIAGNOSIS — B9689 Other specified bacterial agents as the cause of diseases classified elsewhere: Secondary | ICD-10-CM

## 2019-06-15 DIAGNOSIS — N390 Urinary tract infection, site not specified: Secondary | ICD-10-CM

## 2019-06-15 DIAGNOSIS — Z1621 Resistance to vancomycin: Secondary | ICD-10-CM

## 2019-06-15 LAB — URINE CULTURE: Culture: >=100000 — AB

## 2019-06-15 MED ORDER — MORPHINE SULFATE (CONCENTRATE) 10 MG/0.5ML PO SOLN: 5.0000 mg | ORAL | Status: AC | PRN

## 2019-06-15 MED ORDER — SENNOSIDES-DOCUSATE SODIUM 8.6-50 MG PO TABS: 1.0000 | ORAL_TABLET | Freq: Every day | ORAL | Status: DC

## 2019-06-15 MED ORDER — LORAZEPAM 1 MG PO TABS: 1.0000 mg | ORAL_TABLET | ORAL | 0 refills | Status: AC | PRN

## 2019-06-15 MED ORDER — LINEZOLID 600 MG PO TABS: 600.0000 mg | ORAL_TABLET | Freq: Two times a day (BID) | ORAL | 0 refills | Status: AC

## 2019-06-15 NOTE — Progress Notes (Signed)
Engineer, maintenance [facility]) Hospital Liaison note.   Registration paper work completed. RN please call report to [phone]. Please arrange transport for patient.    Thank you,      Farrel Gordon, RN, [facility]   [place]     [place] are on AMION

## 2019-06-15 NOTE — Progress Notes (Signed)
   Subjective: Pt somnolent during interview. Pt endorsed no pain or complaints.  Objective:  Vital signs in last 24 hours: Vitals:   06/15/19 0123 06/15/19 0300 06/15/19 0525 06/15/19 0734  BP: (!) 177/69 (!) 148/72  (!) 147/99  Pulse: (!) 50 62  98  Resp: 16   16  Temp: 97.7 F (36.5 C)   97.6 F (36.4 C)  TempSrc: Oral   Oral  SpO2: 100%   97%  Weight:   47.1 kg   Height:       Constitutional: NAD, lying comfortably in bed Cardiovascular: Irregularly irregular, no m/r/g Pulmonary/Chest: CTAB, no r/r/w Extremities: Warm and well perfused, no edema Neurological: Mostly nonverbal, answers some yes/no questions, no focal deficits  Assessment/Plan:  Active Problems:   Atrial fibrillation with RVR (HCC)   Sinus pause   Fall   Epigastric pain   Goals of care, counseling/discussion    Summary: Sherry [name] is an 61yoF with a h/o atrial fibrillation, stroke, hypertension, hyperlipidemia, CAD, CHF and COPD who presented due to ground-level fall in her bedroom at 4:30am on 10/12 likely 2/2 atrial fibrillation w/RVR. Pt determined to be a poor candidate for pacemaker. Pt transitioned to comfort care after discussing with daughter Sherry [name] with discharge to inpatient hospice planned.  Atrial Fibrillation w/RVR  Sick Sinus Syndrome: Stable. No further pharmaceutical interventions related to abnormal heart rhythm. -Strict bed rest -Fall precautions -Discharge to inpatient hospice, pending bed availabilty  Stable Medical Conditions All non-essential, life-sustaining medications discontinued per daughter, Sherry [name] wishes.  COPD: Stable HFrEF  Takotsubo cardiomyopathy: Stable Hypertension: Stable Hyperlipidemia: Stable  Dispo: Discharge to inpatient hospice pending bed availability-likely 10/14 or 10/15.  DVT PPX: Eliquis 2.5mg , d/c at discharge Code:DNR, confirmed w/pt daughter Diet: Regular   LOS: 2 days   Flonnie Hailstone, Medical Student 06/15/2019, 7:43 AM

## 2019-06-15 NOTE — Discharge Summary (Signed)
Name: Sherry [name] MRN: [id number] DOB: [birth year] 85 y.o. PCP: Darrow BussingKoirala, [place]ibas, MD  Date of Admission: 06/13/2019  6:28 AM Date of Discharge: 06/15/2019 Attending Physician: Reymundo PollGuilloud, Carolyn, MD  Discharge Diagnosis: 1. Atrial Fibrillation w/RVR   Sick Sinus Syndrome 2. Abdominal Pain   VRE UTI  Discharge Medications: Allergies as of 06/15/2019      Reactions   Amiodarone       Medication List    STOP taking these medications   apixaban 2.5 MG Tabs tablet Commonly known as: ELIQUIS   atorvastatin 40 MG tablet Commonly known as: LIPITOR   ferrous sulfate 325 (65 FE) MG tablet   ipratropium-albuterol 0.5-2.5 (3) MG/3ML Soln Commonly known as: DUONEB   Ipratropium-Albuterol 20-100 MCG/ACT Aers respimat Commonly known as: COMBIVENT   metoprolol succinate 25 MG 24 hr tablet Commonly known as: TOPROL-XL   telmisartan 40 MG tablet Commonly known as: MICARDIS     TAKE these medications   acetaminophen 500 MG tablet Commonly known as: TYLENOL Take 2 tablets (1,000 mg total) by mouth every 8 (eight) hours as needed for mild pain, fever or headache.   escitalopram 5 MG tablet Commonly known as: LEXAPRO Take 5 mg by mouth daily.   hydrOXYzine 50 MG tablet Commonly known as: ATARAX/VISTARIL Take 50 mg by mouth at bedtime.   linezolid 600 MG tablet Commonly known as: ZYVOX Take 1 tablet (600 mg total) by mouth 2 (two) times daily for 3 days.   LORazepam 1 MG tablet Commonly known as: ATIVAN Take 1 tablet (1 mg total) by mouth every 4 (four) hours as needed for anxiety.   morphine CONCENTRATE 10 MG/0.5ML Soln concentrated solution Take 0.25 mLs (5 mg total) by mouth every 2 (two) hours as needed for moderate pain (or dyspnea).       Disposition and follow-up:   SherryDane L Sherry [name] [facility] hosp[facility] for comfort care measures. At the hospital follow up visit please address:  1. Address pt comfort w/regards to  COPD. Evaluate pt for air hunger and pain. Verify abdominal pain remains resolved. Atrial Fibrillation w/RVR and Sinus Pauses: Pt's health care decision maker opted not to purse aggressive management w/pacemaker as out of line with pt preferences. Medical interventions deescalated w/discharge to inpatient hospice planned. Abdominal Pain   UTI: Pt had abdominal pain on admission that was resolved at time of discharge. CTA abd negative for acute process. Pt found to have a VRE UTI thought to contribute to abdominal pain and possibly confusion. Pt placed on linezolid 600mg  BID for 3 days (ends 10/17). If confusion or abdominal pain persists, consider repeat UA/culture. COPD: O2 sat 99% on 2L during hospitalization. Pt had previously required 2L supplemental O2. Supplemental oxygen decreased on day of discharge as target O2 saturation 88-92% due to COPD. O2 sat 94% on 0.5L. Consider discontinuing supplemental oxygen if no air hunger with discontinuation.  HFrEF: Stable Hypertension: BP medications discontinued. Hyperlipidemia: Statin discontinued. AMS: Chronic. Thought 2/2 advanced dementia. Possibly contributed to by UTI and carbon dioxide toxicity.  2.  Labs / imaging needed at time of follow-up: No further labs or imaging indicated as pt comfort care.  Follow-up Appointments: None  Hospital Course by problem list: Summary: [name] is an89yoFwith ah/o atrial fibrillation, stroke, hypertension,hyperlipidemia, CAD, CHF and COPD who presented due toground-level fall in her bedroom at 4:30am on 10/12 likely 2/2 atrial fibrillation w/RVR. Pt determined to be a poor candidate for pacemaker. Pt transitioned to  comfort care after discussing with daughter Jacqulyn Cane with discharge to inpatient hospice.  Atrial Fibrillation w/RVR   Sick Sinus Syndrome: Pt presented with atrial fibrillation w/RVR. She was given Lopressor 2.5mg  IV followed by Toprol-XL 25mg  daily. She converted to sinus rhythm with 4-9  second pauses. Pt has a history of sinus pauses when in sinus rhythm. Pt has sick sinus syndrome with atrial fibrillation as her escape rhythm. Pt would benefit from pacemaker; however, pacemaker was not in line with pt preferences per healthcare decision maker, daughter Jacqulyn Cane [phone]). Additionally, pharmaceutical interventions for atrial fibrillation were not pursed due to increased mortality risk from sinus pauses. Comfort care with discharge to inpatient hospice was preferred.   Abdominal Pain   UTI: Pt had diffuse abdominal pain on admission. Cause was unclear at time of admission. Pt found to have a UTI from VRE. CTA abd done was negative for acute process. UTI possibly contributory to increased confusion. Patient placed on linezolid 600mg  BID for 3 days (completes 10/17) due to risk of recurrent abdominal pain and increased confusion. Abdominal pain resolved at time of discharge.  COPD: O2 sat 99% on 2L during hospitalization. Pt had previously required 2L supplemental O2. Supplemental oxygen decreased on day of discharge as target O2 saturation 88-92% due to COPD. O2 sat 94% on 0.5L. Consider discontinuing supplemental oxygen if no air hunger with discontinuation upon arrival to inpatient hospice.  Stable Medical Conditions HFrEF   Takotsubo cardiomyopathy: Stable during hospitalization. Hypertension: BP elevated during hospitalization. BP medications discontinued as patient comfort care. Hyperlipidemia: Stable. Statin discontinued as patient comfort care. AMS: Chronic. Thought 2/2 advanced dementia. Possibly contributed to by UTI.  Discharge Vitals:   BP 121/68 (BP Location: Right Arm)    Pulse (!) 44    Temp 97.6 F (36.4 C) (Oral)    Resp 20    Ht 5\' 6"  (1.676 m)    Wt 47.1 kg    SpO2 97%    BMI 16.75 kg/m   Pertinent Labs, Studies, and Procedures:  BMP Latest Ref Rng & Units 06/14/2019 06/13/2019 06/02/2019  Glucose 70 - 99 mg/dL 64(L) 98 96  BUN 8 - 23 mg/dL 22 18 27(H)    Creatinine 0.44 - 1.00 mg/dL 1.29(H) 1.11(H) 1.10(H)  Sodium 135 - 145 mmol/L 140 139 136  Potassium 3.5 - 5.1 mmol/L 4.4 4.2 4.1  Chloride 98 - 111 mmol/L 103 104 103  CO2 22 - 32 mmol/L 24 24 24   Calcium 8.9 - 10.3 mg/dL 9.3 9.4 9.1   CBC Latest Ref Rng & Units 06/14/2019 06/13/2019 06/01/2019  WBC 4.0 - 10.5 K/uL 7.7 6.8 6.4  Hemoglobin 12.0 - 15.0 g/dL 13.5 14.1 12.0  Hematocrit 36.0 - 46.0 % 42.2 45.2 37.8  Platelets 150 - 400 K/uL 265 271 209   Discharge Instructions: Discharge Instructions    Diet - low sodium heart healthy   Complete by: As directed    Discharge instructions   Complete by: As directed    You were admitted due to an abnormal rhythm of your heart known as atrial fibrillation. While treating your atrial fibrillation, it was determined that your heart often skip beats. Your heart likely goes into atrial fibrillation due to these skipped beats. A pacemaker is the treatment for your heart condition. Due to your care goals, as described by your daughter Lelon Frohlich, a pacemaker was not placed. Medical interventions were deescalated. You were discharged to inpatient hospice for comfort measures.   Increase activity slowly  Complete by: As directed     It was a pleasure to take care of you!  You were admitted due to an abnormal rhythm of your heart known as atrial fibrillation. While treating your atrial fibrillation, it was determined that your heart often skip beats. Your heart likely goes into atrial fibrillation due to these skipped beats. A pacemaker is the treatment for your heart condition. Due to your care goals, as described by your daughter Dewayne Hatch, a pacemaker was not placed. Medical interventions were deescalated. You were discharged to inpatient hospice for comfort measures.  If you have any additional concerns, please contact our clinic at [phone].  SignedVersie Starks, DO 06/15/2019, 1:33 PM

## 2019-06-15 NOTE — Progress Notes (Signed)
[facility] has a bed for Ms. Newnam today.  [facility] will be completing necessary consents this am and will notify [facility] manager once complete so transport can be arranged.  Please call report to [phone] at any time, room assignment will be determined then.  You may d/c any IVs.  Please fax d/c summary to [phone]  Thank you, Venia Carbon RN, BSN, [facility] Liaison (in [place]) [phone]

## 2019-06-15 NOTE — TOC Transition Note (Addendum)
Transition of Care [facility]) - CM/SW Discharge Note   Patient Details  Name: Sherry [name] MRN: [id number] Date of Birth: Aug 14, 1929  [facility] of [facility]) CM/SW Contact:  Zenon Mayo, RN Phone Number: 06/15/2019, 1:34 PM   Clinical Narrative:    NCM notified by Anderson Malta that they have a bed for patient today at [facility].  [facility] called for transport.  NCM notified Venia Carbon with [facility].  Staff RN to call report to [phone].   Final next level of care: [place] Barriers to Discharge: No Barriers Identified   Patient Goals and CMS Choice Patient states their goals for this hospitalization and ongoing recovery are:: Residential hospice comfort care CMS Medicare.gov Compare Post Acute Care list provided to:: Patient Represenative (must comment)(daughter) Choice offered to / list presented to : Adult Children  Discharge Placement                       Discharge Plan and Services                DME Arranged: (NA)         HH Arranged: NA          Social Determinants of Health (SDOH) Interventions     Readmission Risk Interventions Readmission Risk Prevention Plan 06/02/2019  Transportation Screening Complete  PCP or Specialist Appt within 5-7 Days Complete  Home Care Screening Complete  Medication Review (RN CM) Complete  Some recent data might be hidden

## 2019-06-15 NOTE — Plan of Care (Signed)

## 2019-06-18 LAB — CULTURE, BLOOD (ROUTINE X 2)
Culture: NO GROWTH
Culture: NO GROWTH

## 2019-12-24 IMAGING — CT CT CERVICAL SPINE W/O CM
3 series · 14 of 33 positions shown, 17 images · non-contrast
Comparison: 03/05/2019

CLINICAL DATA: Fall with right temporal headache.

EXAM:
CT HEAD WITHOUT CONTRAST
CT CERVICAL SPINE WITHOUT CONTRAST
TECHNIQUE: Multidetector CT imaging of the head and cervical spine was
performed following the standard protocol without intravenous
contrast. Multiplanar CT image reconstructions of the cervical spine
were also generated.

[Series 6: c_spine 1.0 st thins · axial · 0.55mm/px · z∈[-236,-87]mm · 6 of 257 slices shown, 8 images]
[im 40/257  soft-tissue]
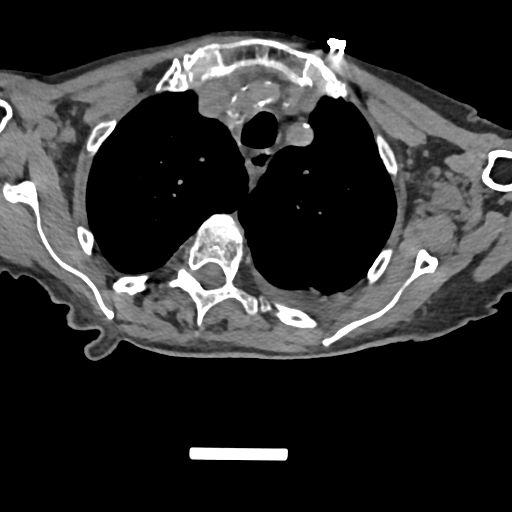
[im 40/257  bone]
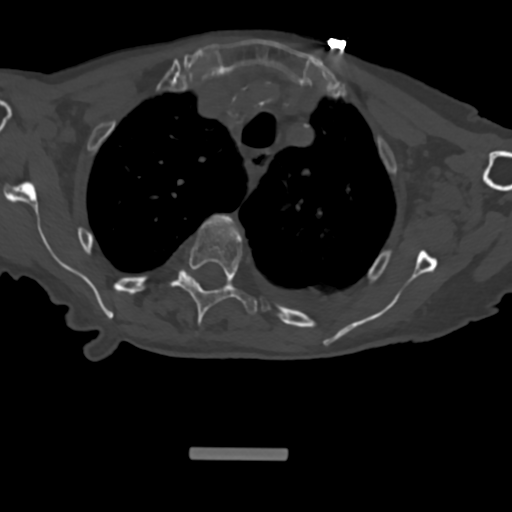
[im 79/257  bone]
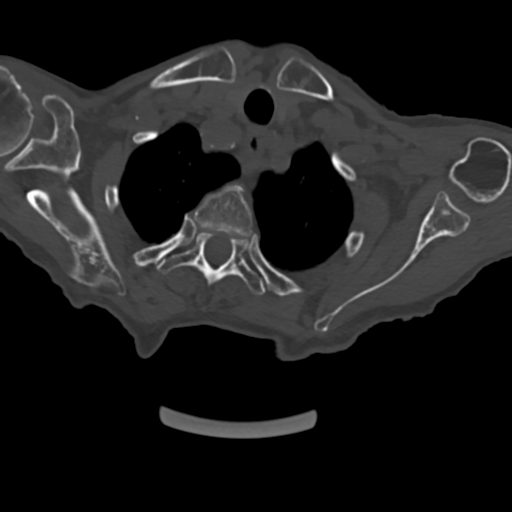
[im 119/257  bone]
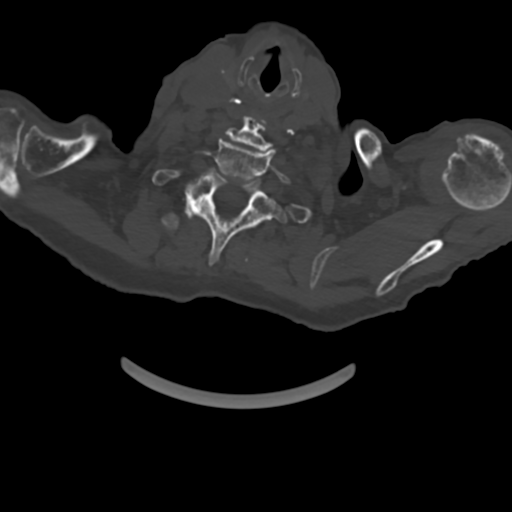
[im 158/257  bone]
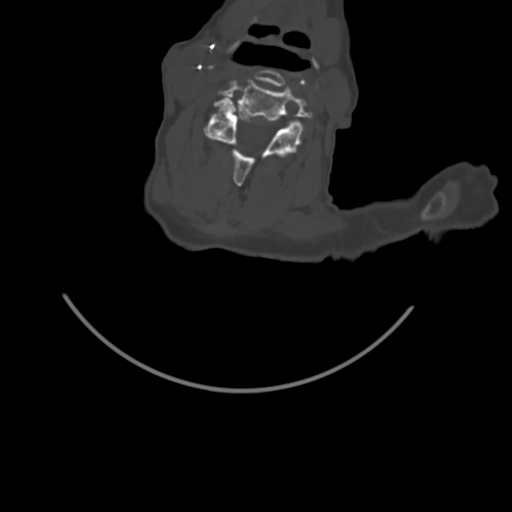
[im 197/257  soft-tissue]
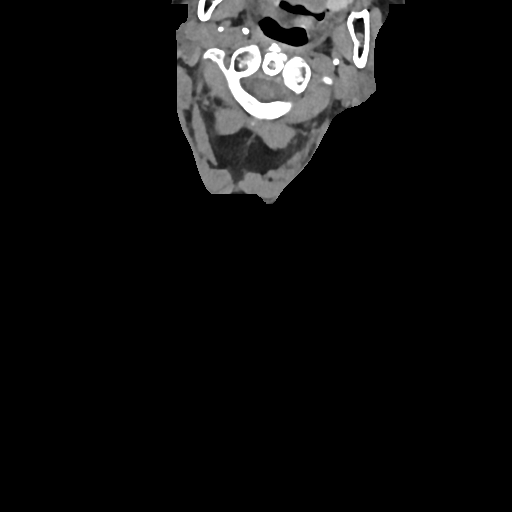
[im 197/257  bone]
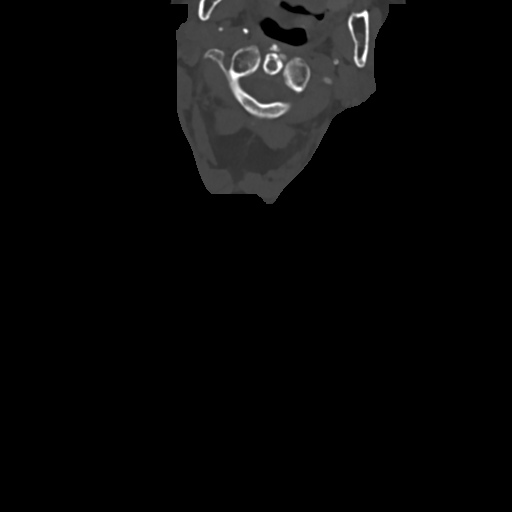
[im 237/257  bone]
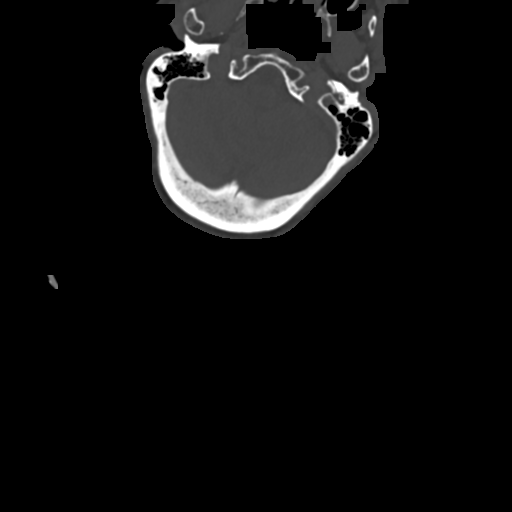

[Series 8: c_spine 2.0 cor bone · coronal · 0.28mm/px · 3 of 74 slices shown]
[im 19/74  bone]
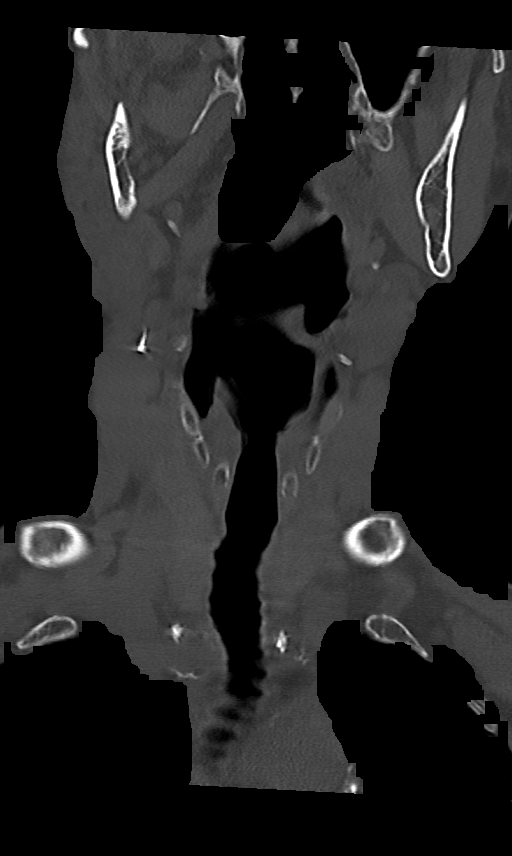
[im 31/74  bone]
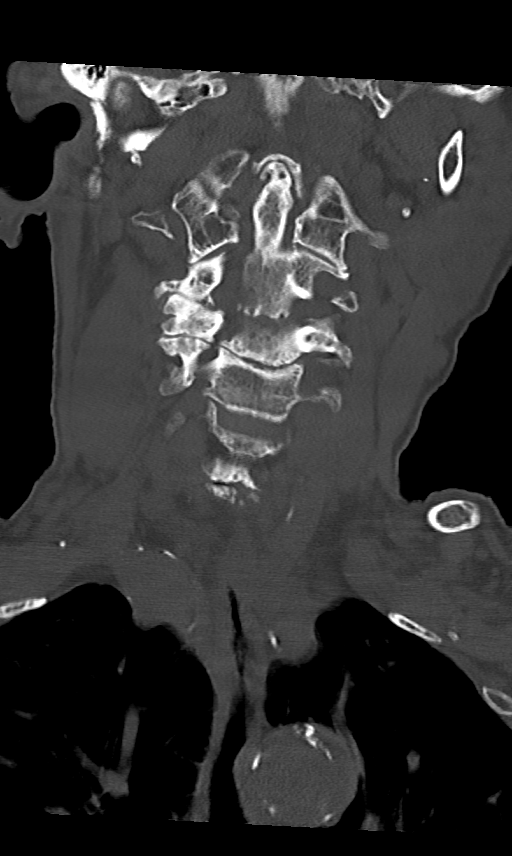
[im 43/74  bone]
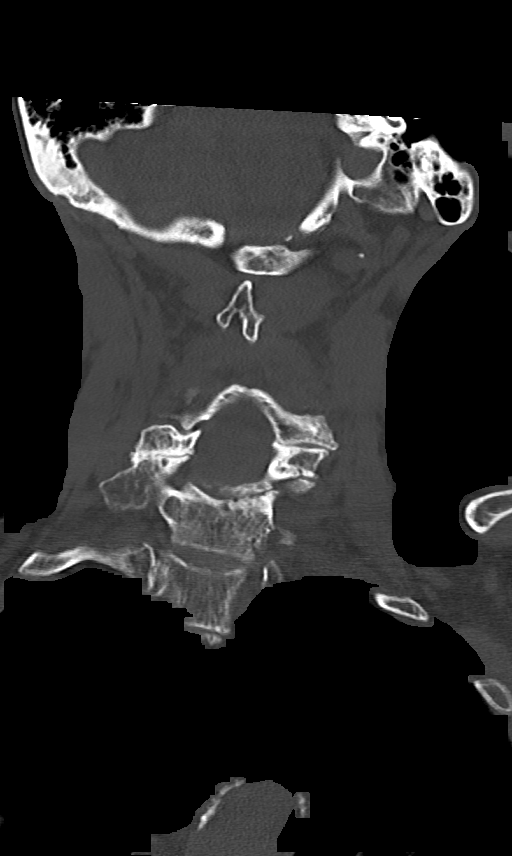

[Series 9: c_spine 2.0 sag bone · sagittal · 0.39mm/px · 5 of 61 slices shown, 6 images]
[im 21/61  bone]
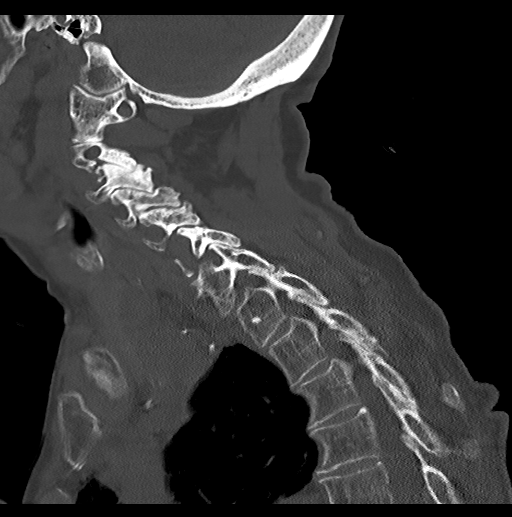
[im 26/61  bone]
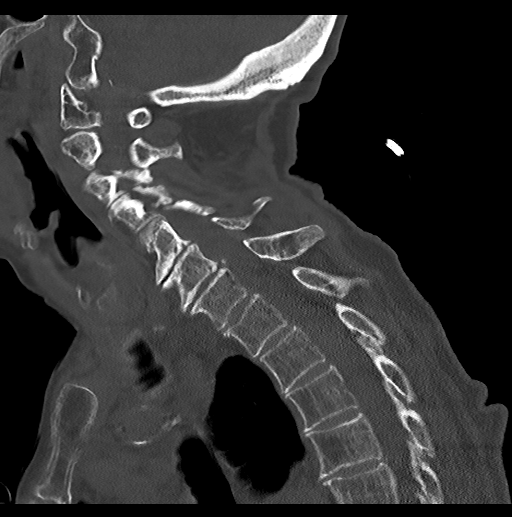
[im 31/61  soft-tissue]
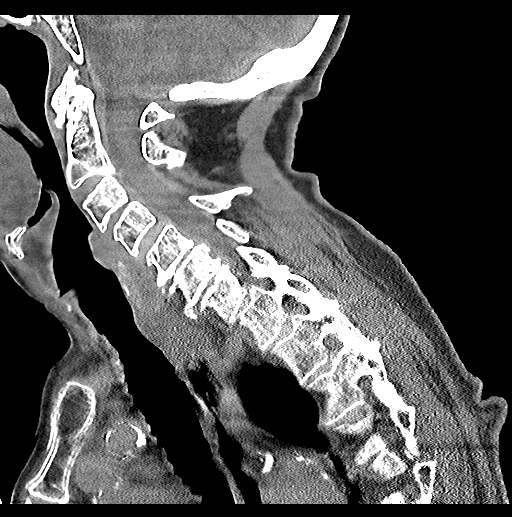
[im 31/61  bone]
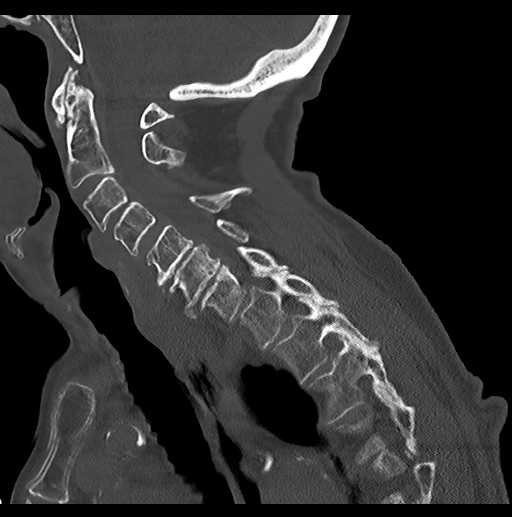
[im 36/61  bone]
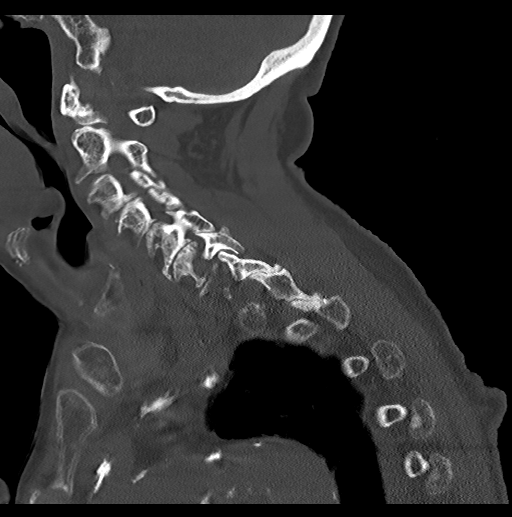
[im 41/61  bone]
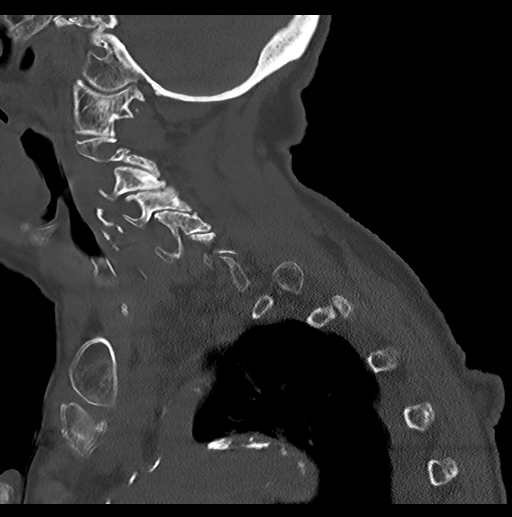

[14 of 33 positions shown; findings below may reference images not displayed]

FINDINGS: CT HEAD FINDINGS

Brain: No evidence of acute infarction, hemorrhage, hydrocephalus,
extra-axial collection or mass lesion/mass effect. Moderate remote
left occipital infarct. Confluent microvascular ischemic low-density
in the cerebral white matter. Ventriculomegaly primarily attributed
to volume loss.

Vascular: No hyperdense vessel or unexpected calcification.

Skull: Negative for fracture

Sinuses/Orbits: Bilateral cataract resection. No evidence of injury.

CT CERVICAL SPINE FINDINGS

Alignment: No traumatic malalignment.

Skull base and vertebrae: No acute fracture.  C3 and C4 laminectomy.

Soft tissues and spinal canal: No prevertebral fluid or swelling. No
visible canal hematoma.

Disc levels: Generalized disc narrowing with moderate lower cervical
endplate ridging. Multilevel facet spurring bulkier on the right.

Upper chest: Layering pleural effusions, small where visualized
period. Atherosclerosis.
IMPRESSION: 1. No evidence of acute intracranial or cervical spine injury.
2. Atrophy and advanced chronic small vessel ischemia. Remote left
occipital infarct.

## 2019-12-24 IMAGING — CT CT HEAD W/O CM
4 series · 15 of 47 positions shown, 17 images · non-contrast
Comparison: 03/05/2019

CLINICAL DATA: Fall with right temporal headache.

EXAM:
CT HEAD WITHOUT CONTRAST
CT CERVICAL SPINE WITHOUT CONTRAST
TECHNIQUE: Multidetector CT imaging of the head and cervical spine was
performed following the standard protocol without intravenous
contrast. Multiplanar CT image reconstructions of the cervical spine
were also generated.

[Series 3: head without · axial · non-contrast · 0.45mm/px · z∈[-75,+45]mm · 7 of 32 slices shown, 9 images]
[im 4/32  brain]
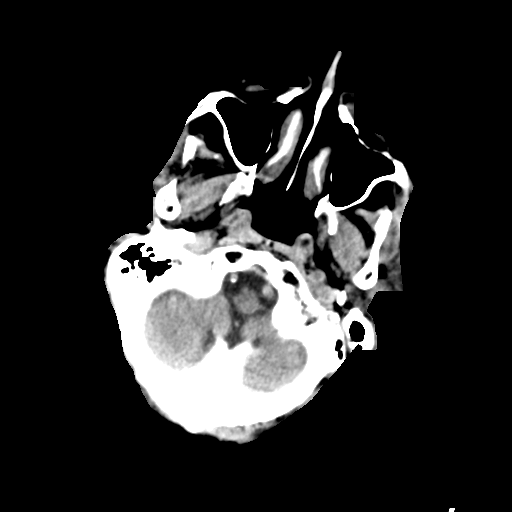
[im 4/32  bone]
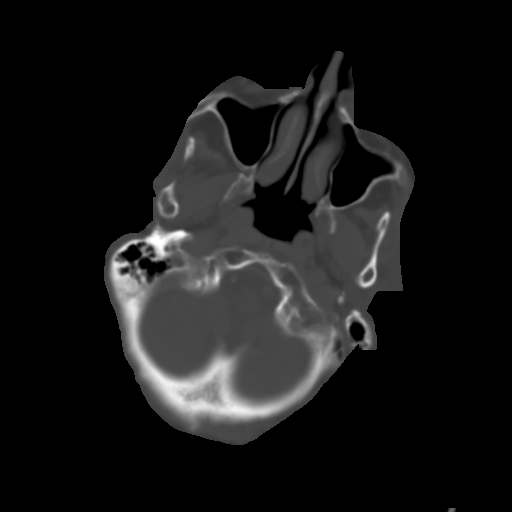
[im 8/32  brain]
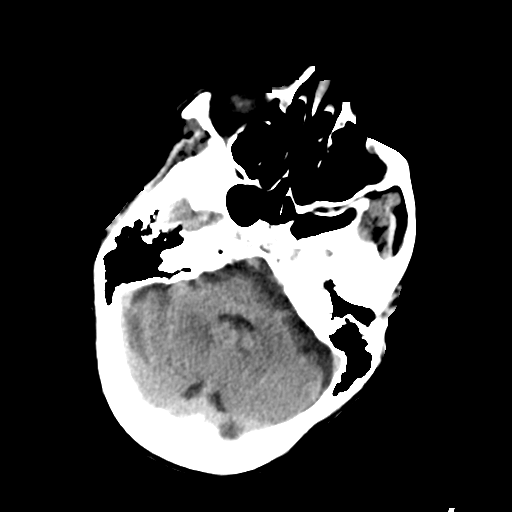
[im 12/32  brain]
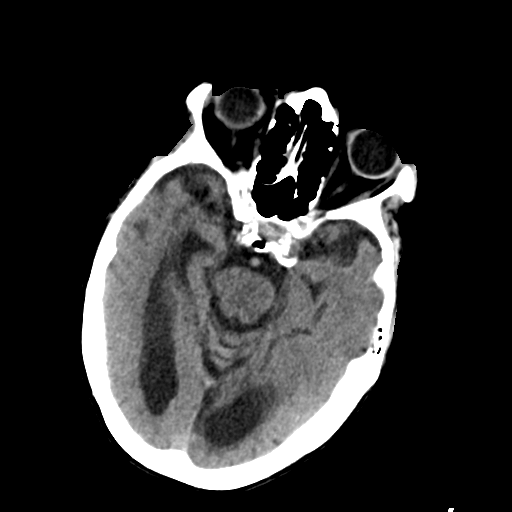
[im 16/32  brain]
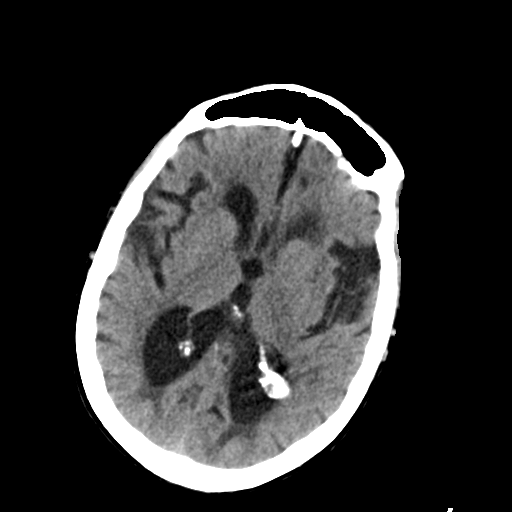
[im 20/32  brain]
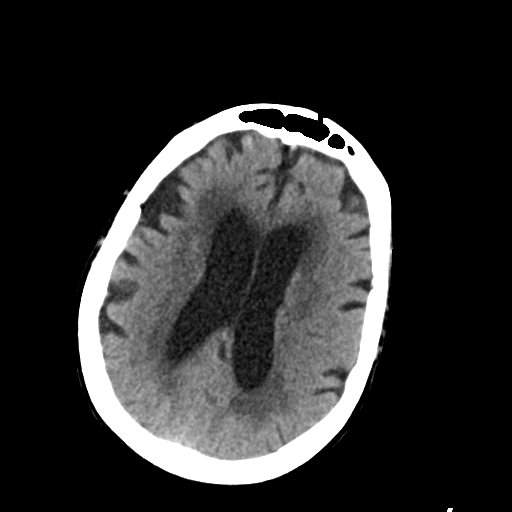
[im 20/32  bone]
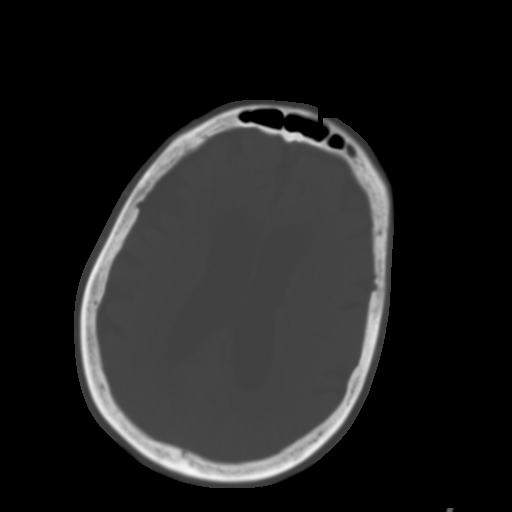
[im 24/32  brain]
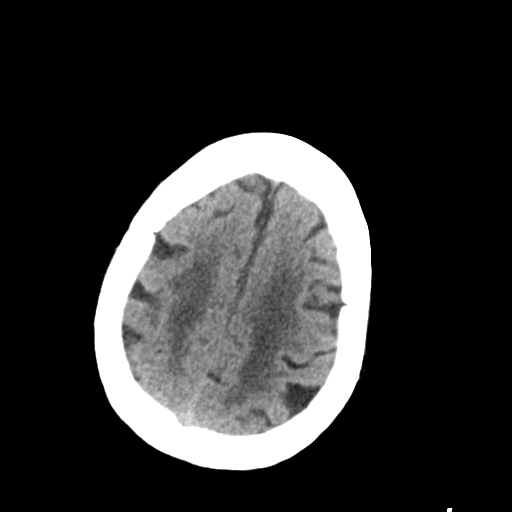
[im 28/32  brain]
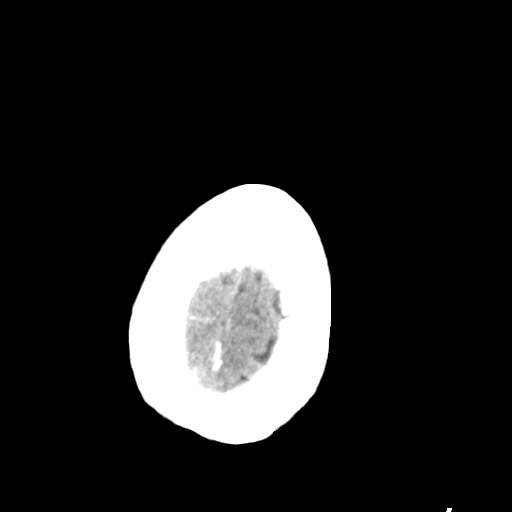

[Series 4: head bone · axial · 0.45mm/px · z∈[-76,-60]mm · 2 of 79 slices shown]
[im 8/79  bone]
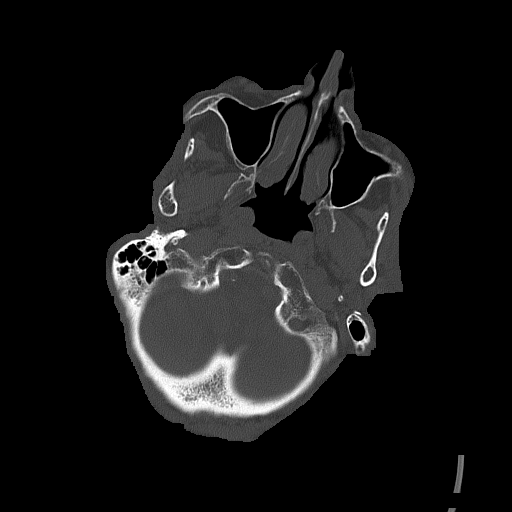
[im 16/79  bone]
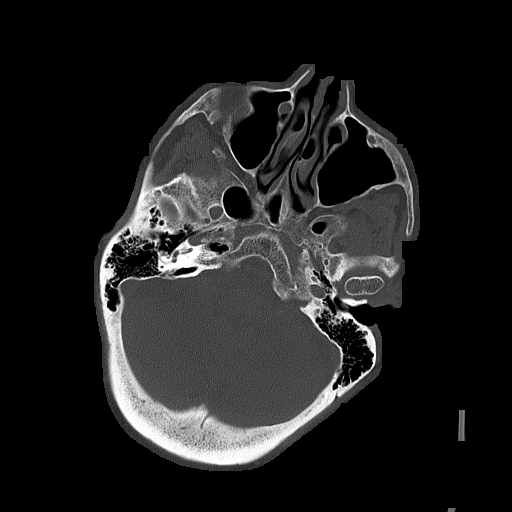

[Series 5: head without cor · coronal · non-contrast · 0.33mm/px · 3 of 71 slices shown]
[im 24/71  brain]
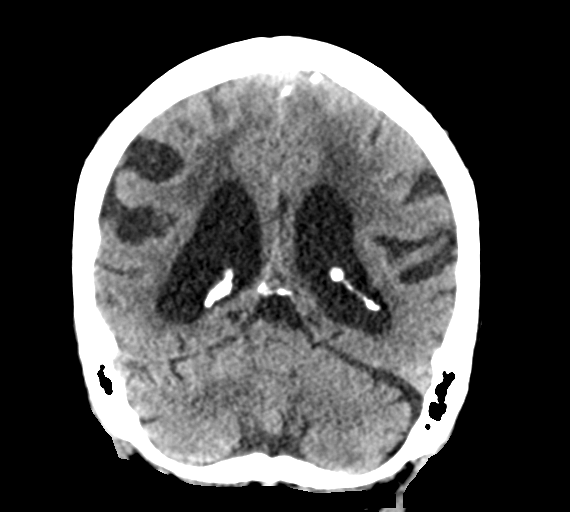
[im 32/71  brain]
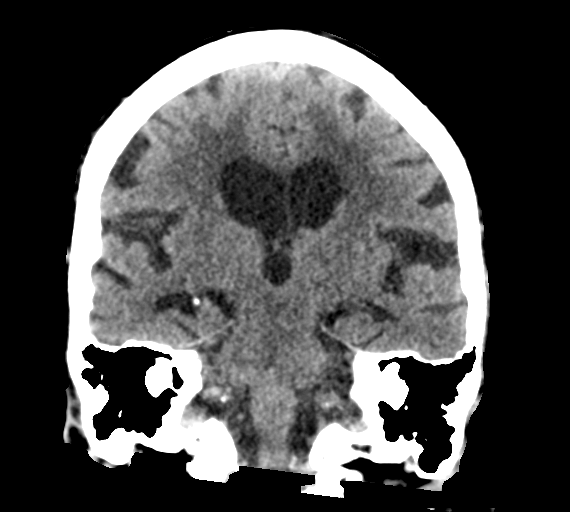
[im 39/71  brain]
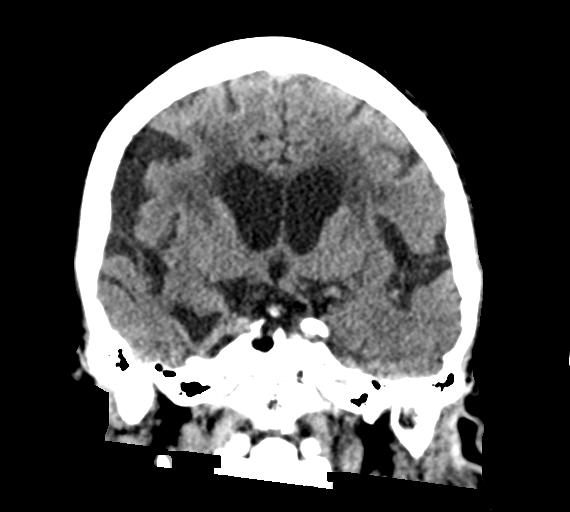

[Series 6: head without sag · sagittal · non-contrast · 0.31mm/px · 3 of 65 slices shown]
[im 22/65  brain]
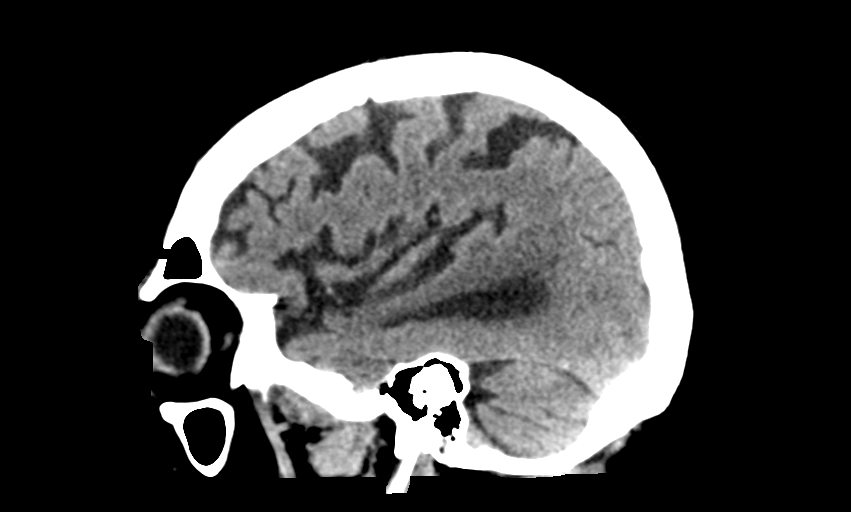
[im 33/65  brain]
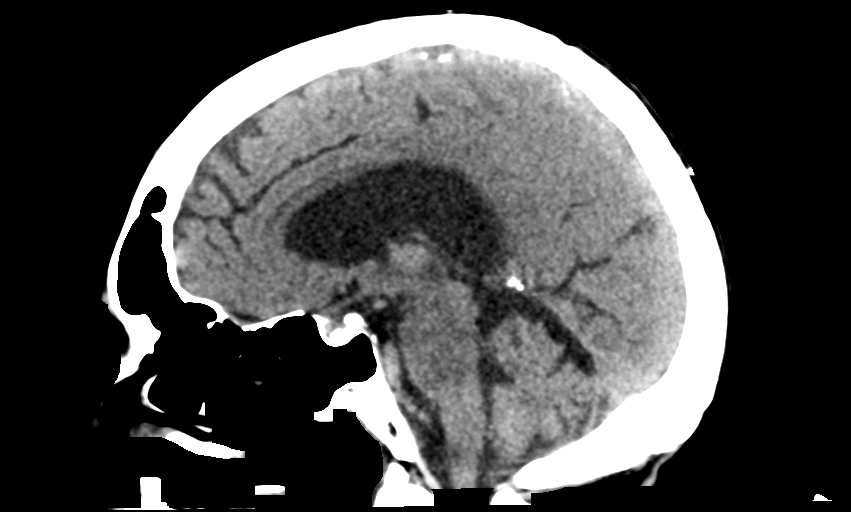
[im 43/65  brain]
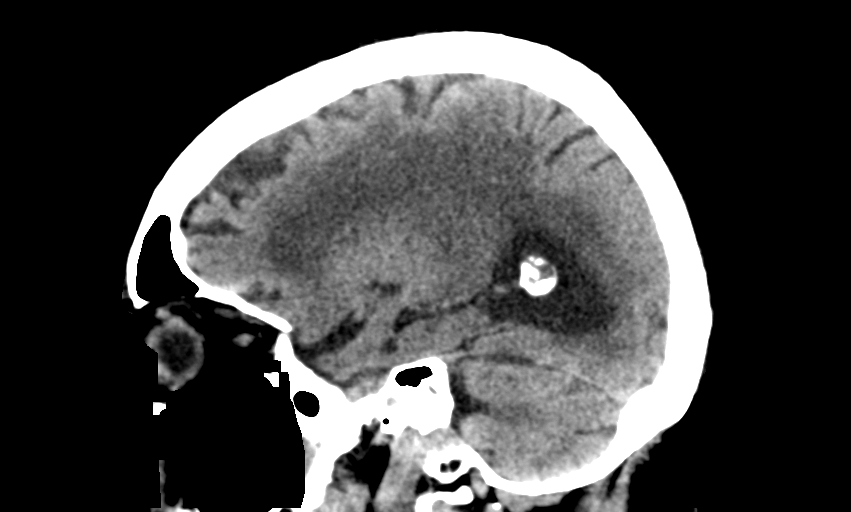

[15 of 47 positions shown; findings below may reference images not displayed]

FINDINGS: CT HEAD FINDINGS

Brain: No evidence of acute infarction, hemorrhage, hydrocephalus,
extra-axial collection or mass lesion/mass effect. Moderate remote
left occipital infarct. Confluent microvascular ischemic low-density
in the cerebral white matter. Ventriculomegaly primarily attributed
to volume loss.

Vascular: No hyperdense vessel or unexpected calcification.

Skull: Negative for fracture

Sinuses/Orbits: Bilateral cataract resection. No evidence of injury.

CT CERVICAL SPINE FINDINGS

Alignment: No traumatic malalignment.

Skull base and vertebrae: No acute fracture.  C3 and C4 laminectomy.

Soft tissues and spinal canal: No prevertebral fluid or swelling. No
visible canal hematoma.

Disc levels: Generalized disc narrowing with moderate lower cervical
endplate ridging. Multilevel facet spurring bulkier on the right.

Upper chest: Layering pleural effusions, small where visualized
period. Atherosclerosis.
IMPRESSION: 1. No evidence of acute intracranial or cervical spine injury.
2. Atrophy and advanced chronic small vessel ischemia. Remote left
occipital infarct.

## 2020-10-02 DEATH — deceased
